# Patient Record
Sex: Male | Born: 1935
Health system: Southern US, Community
[De-identification: ages and names within clinical notes are randomized; demographics above are authoritative.]

## PROBLEM LIST (undated history)

## (undated) DIAGNOSIS — S7290XA Unspecified fracture of unspecified femur, initial encounter for closed fracture: Secondary | ICD-10-CM

## (undated) DIAGNOSIS — R112 Nausea with vomiting, unspecified: Secondary | ICD-10-CM

## (undated) DIAGNOSIS — N400 Enlarged prostate without lower urinary tract symptoms: Secondary | ICD-10-CM

## (undated) DIAGNOSIS — K219 Gastro-esophageal reflux disease without esophagitis: Secondary | ICD-10-CM

## (undated) DIAGNOSIS — G9001 Carotid sinus syncope: Secondary | ICD-10-CM

## (undated) DIAGNOSIS — C320 Malignant neoplasm of glottis: Secondary | ICD-10-CM

## (undated) DIAGNOSIS — M810 Age-related osteoporosis without current pathological fracture: Secondary | ICD-10-CM

## (undated) DIAGNOSIS — I6523 Occlusion and stenosis of bilateral carotid arteries: Secondary | ICD-10-CM

## (undated) DIAGNOSIS — Z9889 Other specified postprocedural states: Secondary | ICD-10-CM

## (undated) DIAGNOSIS — F0390 Unspecified dementia without behavioral disturbance: Secondary | ICD-10-CM

## (undated) DIAGNOSIS — E039 Hypothyroidism, unspecified: Secondary | ICD-10-CM

## (undated) DIAGNOSIS — I10 Essential (primary) hypertension: Secondary | ICD-10-CM

## (undated) HISTORY — DX: Gastro-esophageal reflux disease without esophagitis: K21.9

## (undated) HISTORY — PX: TRANSURETHRAL RESECTION OF PROSTATE: SHX73

## (undated) HISTORY — PX: PACEMAKER INSERTION: SHX728

## (undated) HISTORY — DX: Essential (primary) hypertension: I10

## (undated) HISTORY — PX: HERNIA REPAIR: SHX51

## (undated) HISTORY — DX: Hypothyroidism, unspecified: E03.9

## (undated) HISTORY — PX: NISSEN FUNDOPLICATION: SHX2091

## (undated) HISTORY — PX: OTHER SURGICAL HISTORY: SHX169

## (undated) HISTORY — DX: Malignant neoplasm of glottis: C32.0

---

## 1993-10-03 DIAGNOSIS — C320 Malignant neoplasm of glottis: Secondary | ICD-10-CM

## 1993-10-03 HISTORY — DX: Malignant neoplasm of glottis: C32.0

## 1993-10-03 HISTORY — PX: OTHER SURGICAL HISTORY: SHX169

## 2004-11-25 ENCOUNTER — Ambulatory Visit: Payer: Self-pay | Admitting: Cardiology

## 2004-12-01 ENCOUNTER — Ambulatory Visit: Payer: Self-pay | Admitting: Cardiology

## 2011-11-21 DIAGNOSIS — R55 Syncope and collapse: Secondary | ICD-10-CM | POA: Diagnosis not present

## 2011-12-12 DIAGNOSIS — C329 Malignant neoplasm of larynx, unspecified: Secondary | ICD-10-CM | POA: Diagnosis not present

## 2011-12-12 DIAGNOSIS — R1310 Dysphagia, unspecified: Secondary | ICD-10-CM | POA: Diagnosis not present

## 2012-02-08 DIAGNOSIS — E7800 Pure hypercholesterolemia, unspecified: Secondary | ICD-10-CM | POA: Diagnosis not present

## 2012-02-08 DIAGNOSIS — E039 Hypothyroidism, unspecified: Secondary | ICD-10-CM | POA: Diagnosis not present

## 2012-02-08 DIAGNOSIS — B079 Viral wart, unspecified: Secondary | ICD-10-CM | POA: Diagnosis not present

## 2012-03-28 DIAGNOSIS — Z45018 Encounter for adjustment and management of other part of cardiac pacemaker: Secondary | ICD-10-CM | POA: Diagnosis not present

## 2012-03-28 DIAGNOSIS — I495 Sick sinus syndrome: Secondary | ICD-10-CM | POA: Diagnosis not present

## 2012-04-12 DIAGNOSIS — H35369 Drusen (degenerative) of macula, unspecified eye: Secondary | ICD-10-CM | POA: Diagnosis not present

## 2012-06-27 DIAGNOSIS — I495 Sick sinus syndrome: Secondary | ICD-10-CM | POA: Diagnosis not present

## 2012-06-27 DIAGNOSIS — Z45018 Encounter for adjustment and management of other part of cardiac pacemaker: Secondary | ICD-10-CM | POA: Diagnosis not present

## 2012-07-03 DIAGNOSIS — I498 Other specified cardiac arrhythmias: Secondary | ICD-10-CM | POA: Diagnosis not present

## 2012-07-03 DIAGNOSIS — Z45018 Encounter for adjustment and management of other part of cardiac pacemaker: Secondary | ICD-10-CM | POA: Diagnosis not present

## 2012-07-10 DIAGNOSIS — Z23 Encounter for immunization: Secondary | ICD-10-CM | POA: Diagnosis not present

## 2012-08-01 DIAGNOSIS — Z85828 Personal history of other malignant neoplasm of skin: Secondary | ICD-10-CM | POA: Diagnosis not present

## 2012-08-01 DIAGNOSIS — L570 Actinic keratosis: Secondary | ICD-10-CM | POA: Diagnosis not present

## 2012-08-14 DIAGNOSIS — Z0000 Encounter for general adult medical examination without abnormal findings: Secondary | ICD-10-CM | POA: Diagnosis not present

## 2012-08-14 DIAGNOSIS — R5381 Other malaise: Secondary | ICD-10-CM | POA: Diagnosis not present

## 2012-08-14 DIAGNOSIS — E7800 Pure hypercholesterolemia, unspecified: Secondary | ICD-10-CM | POA: Diagnosis not present

## 2012-08-14 DIAGNOSIS — Z1212 Encounter for screening for malignant neoplasm of rectum: Secondary | ICD-10-CM | POA: Diagnosis not present

## 2012-08-14 DIAGNOSIS — E559 Vitamin D deficiency, unspecified: Secondary | ICD-10-CM | POA: Diagnosis not present

## 2012-08-14 DIAGNOSIS — E039 Hypothyroidism, unspecified: Secondary | ICD-10-CM | POA: Diagnosis not present

## 2012-09-07 DIAGNOSIS — R319 Hematuria, unspecified: Secondary | ICD-10-CM | POA: Diagnosis not present

## 2012-09-07 DIAGNOSIS — Z87448 Personal history of other diseases of urinary system: Secondary | ICD-10-CM | POA: Diagnosis not present

## 2012-09-12 DIAGNOSIS — Z79899 Other long term (current) drug therapy: Secondary | ICD-10-CM | POA: Diagnosis not present

## 2012-10-04 DIAGNOSIS — K921 Melena: Secondary | ICD-10-CM | POA: Diagnosis not present

## 2012-10-05 ENCOUNTER — Telehealth: Payer: Self-pay | Admitting: Internal Medicine

## 2012-10-05 NOTE — Telephone Encounter (Signed)
Pt came into office this morning to let us know that his PCP (Dr Sherryll Burger) would be calling us to set him up for a TCS. He said that their computers were down and couldn't fax [place] anything at the moment. I told him that we may need to bring him in for an OV or we might be able to just triage him over the phone, but we need to hear from his PCP first. He said he wasn't having any problems now, but did have a little bit of blood in his stool back in November and was fine now. Please advise if patient will need OV or can he be triaged. [phone]

## 2012-10-08 NOTE — Telephone Encounter (Signed)
Pt is scheduled for OV with Gerrit Halls, NP on 10/17/2012 at 2:00 PM.

## 2012-10-08 NOTE — Telephone Encounter (Signed)
Received the referral this AM. Pt was referred for Heme positive stool.

## 2012-10-15 DIAGNOSIS — I495 Sick sinus syndrome: Secondary | ICD-10-CM | POA: Diagnosis not present

## 2012-10-16 DIAGNOSIS — H353190 Nonexudative age-related macular degeneration, unspecified eye, stage unspecified: Secondary | ICD-10-CM | POA: Diagnosis not present

## 2012-10-16 DIAGNOSIS — H11049 Peripheral pterygium, stationary, unspecified eye: Secondary | ICD-10-CM | POA: Diagnosis not present

## 2012-10-16 DIAGNOSIS — H2510 Age-related nuclear cataract, unspecified eye: Secondary | ICD-10-CM | POA: Diagnosis not present

## 2012-10-16 DIAGNOSIS — H43819 Vitreous degeneration, unspecified eye: Secondary | ICD-10-CM | POA: Diagnosis not present

## 2012-10-17 ENCOUNTER — Encounter: Payer: Self-pay | Admitting: Gastroenterology

## 2012-10-17 ENCOUNTER — Other Ambulatory Visit: Payer: Self-pay | Admitting: Internal Medicine

## 2012-10-17 ENCOUNTER — Ambulatory Visit (INDEPENDENT_AMBULATORY_CARE_PROVIDER_SITE_OTHER): Payer: Medicare Other | Admitting: Gastroenterology

## 2012-10-17 VITALS — BP 145/67 | HR 88 | Temp 98.1°F | Ht 71.0 in | Wt 155.4 lb

## 2012-10-17 DIAGNOSIS — R195 Other fecal abnormalities: Secondary | ICD-10-CM | POA: Diagnosis not present

## 2012-10-17 MED ORDER — PEG 3350-KCL-NA BICARB-NACL 420 G PO SOLR: 4000.0000 mL | ORAL | Status: DC

## 2012-10-17 NOTE — Patient Instructions (Addendum)
We have scheduled you for a colonoscopy with Dr. Jena Gauss in the near future.  Further recommendations to follow once this is completed.

## 2012-10-17 NOTE — Assessment & Plan Note (Addendum)
79 year old pleasant male with heme positive stool, scant paper hematochezia in the setting of mild constipation. Last TCS was at [facility] at least 10 years ago, reportedly normal per pt. CBC has been requested from PCP. Pt has no significant lower GI symptoms, no changes in bowel habits. He has lost a few pounds over the past 6 months, but he does note he has increased his walking regimen. No upper GI symptoms.   Obtain CBC for our records. (Received as of 1/17. No anemia noted).  Proceed with TCS with Dr. Jena Gauss in near future: the risks, benefits, and alternatives have been discussed with the patient in detail. The patient states understanding and desires to proceed. Due to pt's reported hx of significant nausea with anesthesia, we will proceed with Zofran 4 mg IV on call Pt has pacemaker.

## 2012-10-17 NOTE — Progress Notes (Signed)
Primary Care Physician:  Kirstie Peri, MD Primary Gastroenterologist:  Dr. Jena Gauss   Chief Complaint  Patient presents with  . Rectal Bleeding    HPI:   79 year old male who presents today secondary to heme positive stool. Last TCS at least 10 years ago at [facility] per pt. Pt believes it was normal. Noted paper hematochezia in November in the setting of mild constipation. Denies any rectal pain, discomfort. Occasional gas but not enough to even mention, per pt. No changes in bowel habits. Notes losing about 5-6 pounds over the past 6 months, but he has increased his walking. No nausea or vomiting. On Prilosec, which controls GERD. No dysphagia.   Past Medical History  Diagnosis Date  . Hypothyroidism   . GERD (gastroesophageal reflux disease)   . Vocal cord cancer     at least 13 years ago  . Hypertension     Past Surgical History  Procedure Date  . Nissen fundoplication   . Vocal cord cancer removal   . Hernia repair   . Pacemaker insertion   . Transurethral resection of prostate     Current Outpatient Prescriptions  Medication Sig Dispense Refill  . alendronate (FOSAMAX) 70 MG tablet Take 70 mg by mouth every 7 (seven) days.       Marland Kitchen aspirin 325 MG tablet Take 325 mg by mouth daily.      Marland Kitchen omeprazole (PRILOSEC) 20 MG capsule Take 20 mg by mouth daily.       Marland Kitchen SYNTHROID 75 MCG tablet Take 75 mcg by mouth daily.       Marland Kitchen triamterene-hydrochlorothiazide (MAXZIDE-25) 37.5-25 MG per tablet Take 1 tablet by mouth daily.         Allergies as of 10/17/2012 - Review Complete 10/17/2012  Allergen Reaction Noted  . Prevacid (lansoprazole) Other (See Comments) 10/17/2012  . Prilosec (omeprazole) Other (See Comments) 10/17/2012  . Codeine Other (See Comments) 10/17/2012  . Penicillins Rash 10/17/2012    Family History  Problem Relation Age of Onset  . Colon cancer Neg Hx     History   Social History  . Marital Status: Married    Spouse Name: N/A    Number of Children: N/A    . Years of Education: N/A   Occupational History  . Retired     Financial controller in [place]   Social History Main Topics  . Smoking status: Never Smoker   . Smokeless tobacco: Not on file  . Alcohol Use: No  . Drug Use: No  . Sexually Active: Not on file   Other Topics Concern  . Not on file   Social History Narrative  . No narrative on file    Review of Systems: Gen: SEE HPI  CV: Denies chest pain, heart palpitations, peripheral edema, syncope.  Resp: Denies shortness of breath at rest or with exertion. Denies wheezing or cough.  GI: SEE HPI GU : Denies urinary burning, urinary frequency, urinary hesitancy MS: +joint pain Derm: Denies rash, itching, dry skin Psych: Denies depression, anxiety, memory loss, and confusion Heme: Denies bruising, bleeding, and enlarged lymph nodes.  Physical Exam: BP 145/67  Pulse 88  Temp 98.1 F (36.7 C) (Oral)  Ht 5\' 11"  (1.803 m)  Wt 155 lb 6.4 oz (70.489 kg)  BMI 21.67 kg/m2 General:   Alert and oriented. Pleasant and cooperative. Well-nourished and well-developed.  Head:  Normocephalic and atraumatic. Eyes:  Without icterus, sclera clear and conjunctiva pink.  Ears:  Normal auditory acuity. Nose:  No  deformity, discharge,  or lesions. Mouth:  No deformity or lesions, oral mucosa pink.  Neck:  Supple, without mass or thyromegaly. Lungs:  Clear to auscultation bilaterally. No wheezes, rales, or rhonchi. No distress.  Heart:  S1, S2 present without murmurs appreciated.  Abdomen:  +BS, soft, non-tender and non-distended. No HSM noted. No guarding or rebound. Questionable small ventral hernia vs rectus diastasis.  Rectal:  Deferred  Msk:  Symmetrical without gross deformities. Normal posture. Extremities:  Without clubbing or edema. Neurologic:  Alert and  oriented x4;  grossly normal neurologically. Skin:  Intact without significant lesions or rashes. Cervical Nodes:  No significant cervical adenopathy. Psych:  Alert and  cooperative. Normal mood and affect.

## 2012-10-17 NOTE — Progress Notes (Signed)
Faxed to PCP

## 2012-10-18 ENCOUNTER — Encounter (HOSPITAL_COMMUNITY): Payer: Self-pay | Admitting: Pharmacy Technician

## 2012-10-22 NOTE — Progress Notes (Signed)
Labs from Nov 2013:  Hgb 15.7, Hct 46.5, PLTs 241

## 2012-10-24 ENCOUNTER — Encounter: Payer: Self-pay | Admitting: Gastroenterology

## 2012-10-24 DIAGNOSIS — R110 Nausea: Secondary | ICD-10-CM

## 2012-10-24 MED ORDER — PROMETHAZINE HCL 25 MG/ML IJ SOLN: 12.5000 mg | Freq: Once | INTRAMUSCULAR | Status: DC

## 2012-10-24 MED ORDER — ONDANSETRON HCL 4 MG/2ML IJ SOLN: 4.0000 mg | Freq: Once | INTRAMUSCULAR | Status: DC

## 2012-10-24 NOTE — Addendum Note (Signed)
Addended by: Jennings Books on: 10/24/2012 12:12 PM   Modules accepted: Orders

## 2012-10-24 NOTE — Addendum Note (Signed)
Addended by: Jennings Books on: 10/24/2012 12:09 PM   Modules accepted: Orders

## 2012-10-24 NOTE — Progress Notes (Addendum)
I had ordered Phenergan due to hx of significant nausea following anesthesia per patient.  After discussion with Dr. Jena Gauss, we will change this to Zofran 4 mg on call.

## 2012-10-24 NOTE — Progress Notes (Signed)
Patient ID: Phillip Taylor, male   DOB: 10-Aug-1925, 79 y.o.   MRN: [id number]    I spoke with Hollie Salk and she will cancel the phenergan order on patient.

## 2012-10-24 NOTE — Progress Notes (Signed)
Patient ID: Phillip Taylor, male   DOB: 1924/10/28, 79 y.o.   MRN: [id number]  I spoke with Shawna Orleans in Endo and Zofran ordered.

## 2012-10-24 NOTE — Progress Notes (Signed)
I ordered Phenergan 12.5 mg IV on call on the encounter sheet. HOWEVER, pt does NOT NEED any Phenergan prior to procedure. Please cancel the order for Phenergan 12.5 mg.

## 2012-10-25 ENCOUNTER — Ambulatory Visit (HOSPITAL_COMMUNITY)
Admission: RE | Admit: 2012-10-25 | Discharge: 2012-10-25 | Disposition: A | Discharge status: Home / Self Care | Payer: Medicare Other | Source: Ambulatory Visit | Attending: Internal Medicine | Admitting: Internal Medicine

## 2012-10-25 ENCOUNTER — Encounter (HOSPITAL_COMMUNITY)
Admission: RE | Disposition: A | Discharge status: Home / Self Care | Payer: Self-pay | Source: Ambulatory Visit | Attending: Internal Medicine

## 2012-10-25 ENCOUNTER — Encounter (HOSPITAL_COMMUNITY): Payer: Self-pay

## 2012-10-25 DIAGNOSIS — I10 Essential (primary) hypertension: Secondary | ICD-10-CM | POA: Insufficient documentation

## 2012-10-25 DIAGNOSIS — K620 Anal polyp: Secondary | ICD-10-CM

## 2012-10-25 DIAGNOSIS — R195 Other fecal abnormalities: Secondary | ICD-10-CM

## 2012-10-25 DIAGNOSIS — R110 Nausea: Secondary | ICD-10-CM

## 2012-10-25 DIAGNOSIS — D128 Benign neoplasm of rectum: Secondary | ICD-10-CM | POA: Diagnosis not present

## 2012-10-25 DIAGNOSIS — K621 Rectal polyp: Secondary | ICD-10-CM

## 2012-10-25 DIAGNOSIS — K5730 Diverticulosis of large intestine without perforation or abscess without bleeding: Secondary | ICD-10-CM | POA: Insufficient documentation

## 2012-10-25 DIAGNOSIS — K921 Melena: Secondary | ICD-10-CM | POA: Diagnosis not present

## 2012-10-25 DIAGNOSIS — D126 Benign neoplasm of colon, unspecified: Secondary | ICD-10-CM

## 2012-10-25 HISTORY — DX: Nausea with vomiting, unspecified: R11.2

## 2012-10-25 HISTORY — DX: Nausea with vomiting, unspecified: Z98.890

## 2012-10-25 HISTORY — PX: COLONOSCOPY: SHX5424

## 2012-10-25 SURGERY — COLONOSCOPY
Anesthesia: Moderate Sedation

## 2012-10-25 MED ORDER — ONDANSETRON HCL 4 MG/2ML IJ SOLN
INTRAMUSCULAR | Status: AC
  Filled 2012-10-25: qty 2

## 2012-10-25 MED ORDER — MIDAZOLAM HCL 5 MG/5ML IJ SOLN
INTRAMUSCULAR | Status: DC | PRN
  Administered 2012-10-25: 2 mg via INTRAVENOUS
  Administered 2012-10-25: 1 mg via INTRAVENOUS

## 2012-10-25 MED ORDER — MIDAZOLAM HCL 5 MG/5ML IJ SOLN
INTRAMUSCULAR | Status: AC
  Filled 2012-10-25: qty 10

## 2012-10-25 MED ORDER — MEPERIDINE HCL 100 MG/ML IJ SOLN
INTRAMUSCULAR | Status: AC
  Filled 2012-10-25: qty 2

## 2012-10-25 MED ORDER — STERILE WATER FOR IRRIGATION IR SOLN
Status: DC | PRN
  Administered 2012-10-25: 15:00:00

## 2012-10-25 MED ORDER — MEPERIDINE HCL 100 MG/ML IJ SOLN
INTRAMUSCULAR | Status: DC | PRN
  Administered 2012-10-25: 50 mg via INTRAVENOUS

## 2012-10-25 MED ORDER — SODIUM CHLORIDE 0.45 % IV SOLN
INTRAVENOUS | Status: DC
  Administered 2012-10-25: 13:00:00 via INTRAVENOUS

## 2012-10-25 MED ORDER — ONDANSETRON HCL 4 MG/2ML IJ SOLN
INTRAMUSCULAR | Status: DC | PRN
  Administered 2012-10-25: 4 mg via INTRAVENOUS

## 2012-10-25 MED ORDER — ONDANSETRON HCL 4 MG/2ML IJ SOLN
4.0000 mg | Freq: Once | INTRAMUSCULAR | Status: AC
  Administered 2012-10-25: 4 mg via INTRAVENOUS

## 2012-10-25 NOTE — Op Note (Signed)
[facility] [address] [place], [address]   COLONOSCOPY PROCEDURE REPORT  PATIENT: Chanel, Mckesson  MR#:         [id number] BIRTHDATE: [birth year] , 87  yrs. old GENDER: Male ENDOSCOPIST: R.  Roetta Sessions, MD FACP FACG REFERRED BY:  Kirstie Peri, M.D. PROCEDURE DATE:  10/25/2012 PROCEDURE:     ileocolonoscopy with multiple snare polypectomies  INDICATIONS: Hemoccult-positive stool; last colonoscopy 10 years ago  INFORMED CONSENT:  The risks, benefits, alternatives and imponderables including but not limited to bleeding, perforation as well as the possibility of a missed lesion have been reviewed.  The potential for biopsy, lesion removal, etc. have also been discussed.  Questions have been answered.  All parties agreeable. Please see the history and physical in the medical record for more information.  MEDICATIONS: Versed 3 mg IV and Demerol 50 mg IV in divided doses.  DESCRIPTION OF PROCEDURE:  After a digital rectal exam was performed, the Pentax Colonoscope [phone]  colonoscope was advanced from the anus through the rectum and colon to the area of the cecum, ileocecal valve and appendiceal orifice.  The cecum was deeply intubated.  These structures were well-seen and photographed for the record.  From the level of the cecum and ileocecal valve, the scope was slowly and cautiously withdrawn.  The mucosal surfaces were carefully surveyed utilizing scope tip deflection to facilitate fold flattening as needed.  The scope was pulled down into the rectum where a thorough examination including retroflexion was performed.    FINDINGS:  Adequate preparation.  4 mm sessile polyp in the rectum at 3 cm from the anal verge; otherwise, normal rectum aside from internal hemorrhoids. Sigmoid diverticulosis. 1.25 cm pedunculated polyp at the splenic flexure. The patient had (1) 5 mm polyp just distal to the ileocecal valve; otherwise, the remainder of the colonic  mucosa appeared normal.  THERAPEUTIC / DIAGNOSTIC MANEUVERS PERFORMED: The above-mentioned polyps were hot snare /  removed. The rectal polyp was not recovered.  COMPLICATIONS: None  CECAL WITHDRAWAL TIME:  16 minutes  IMPRESSION:  Colonic diverticulosis. Colonic polyps-multiple-status post multiple snare polypectomies  RECOMMENDATIONS: Followup on pathology.   _______________________________ eSigned:  R. Roetta Sessions, MD FACP [facility] 10/25/2012 3:38 PM   CC:    PATIENT NAME:  Oluwatomisin, Deman MR#: [id number]

## 2012-10-25 NOTE — H&P (View-Only) (Signed)
 Primary Care Physician:  SHAH,ASHISH, MD Primary Gastroenterologist:  Dr. Rourk   Chief Complaint  Patient presents with  . Rectal Bleeding    HPI:   79-year-old male who presents today secondary to heme positive stool. Last TCS at least 10 years ago at [facility] per pt. Pt believes it was normal. Noted paper hematochezia in November in the setting of mild constipation. Denies any rectal pain, discomfort. Occasional gas but not enough to even mention, per pt. No changes in bowel habits. Notes losing about 5-6 pounds over the past 6 months, but he has increased his walking. No nausea or vomiting. On Prilosec, which controls GERD. No dysphagia.   Past Medical History  Diagnosis Date  . Hypothyroidism   . GERD (gastroesophageal reflux disease)   . Vocal cord cancer     at least 13 years ago  . Hypertension     Past Surgical History  Procedure Date  . Nissen fundoplication   . Vocal cord cancer removal   . Hernia repair   . Pacemaker insertion   . Transurethral resection of prostate     Current Outpatient Prescriptions  Medication Sig Dispense Refill  . alendronate (FOSAMAX) 70 MG tablet Take 70 mg by mouth every 7 (seven) days.       . aspirin 325 MG tablet Take 325 mg by mouth daily.      . omeprazole (PRILOSEC) 20 MG capsule Take 20 mg by mouth daily.       . SYNTHROID 75 MCG tablet Take 75 mcg by mouth daily.       . triamterene-hydrochlorothiazide (MAXZIDE-25) 37.5-25 MG per tablet Take 1 tablet by mouth daily.         Allergies as of 10/17/2012 - Review Complete 10/17/2012  Allergen Reaction Noted  . Prevacid (lansoprazole) Other (See Comments) 10/17/2012  . Prilosec (omeprazole) Other (See Comments) 10/17/2012  . Codeine Other (See Comments) 10/17/2012  . Penicillins Rash 10/17/2012    Family History  Problem Relation Age of Onset  . Colon cancer Neg Hx     History   Social History  . Marital Status: Married    Spouse Name: N/A    Number of Children: N/A    . Years of Education: N/A   Occupational History  . Retired     [employer] in [place]   Social History Main Topics  . Smoking status: Never Smoker   . Smokeless tobacco: Not on file  . Alcohol Use: No  . Drug Use: No  . Sexually Active: Not on file   Other Topics Concern  . Not on file   Social History Narrative  . No narrative on file    Review of Systems: Gen: SEE HPI  CV: Denies chest pain, heart palpitations, peripheral edema, syncope.  Resp: Denies shortness of breath at rest or with exertion. Denies wheezing or cough.  GI: SEE HPI GU : Denies urinary burning, urinary frequency, urinary hesitancy MS: +joint pain Derm: Denies rash, itching, dry skin Psych: Denies depression, anxiety, memory loss, and confusion Heme: Denies bruising, bleeding, and enlarged lymph nodes.  Physical Exam: BP 145/67  Pulse 88  Temp 98.1 F (36.7 C) (Oral)  Ht 5' 11" (1.803 m)  Wt 155 lb 6.4 oz (70.489 kg)  BMI 21.67 kg/m2 General:   Alert and oriented. Pleasant and cooperative. Well-nourished and well-developed.  Head:  Normocephalic and atraumatic. Eyes:  Without icterus, sclera clear and conjunctiva pink.  Ears:  Normal auditory acuity. Nose:  No   deformity, discharge,  or lesions. Mouth:  No deformity or lesions, oral mucosa pink.  Neck:  Supple, without mass or thyromegaly. Lungs:  Clear to auscultation bilaterally. No wheezes, rales, or rhonchi. No distress.  Heart:  S1, S2 present without murmurs appreciated.  Abdomen:  +BS, soft, non-tender and non-distended. No HSM noted. No guarding or rebound. Questionable small ventral hernia vs rectus diastasis.  Rectal:  Deferred  Msk:  Symmetrical without gross deformities. Normal posture. Extremities:  Without clubbing or edema. Neurologic:  Alert and  oriented x4;  grossly normal neurologically. Skin:  Intact without significant lesions or rashes. Cervical Nodes:  No significant cervical adenopathy. Psych:  Alert and  cooperative. Normal mood and affect.     

## 2012-10-25 NOTE — Interval H&P Note (Signed)
History and Physical Interval Note:  10/25/2012 2:43 PM  Phillip Taylor  has presented today for surgery, with the diagnosis of HEME POSITIVE STOOL  The various methods of treatment have been discussed with the patient and family. After consideration of risks, benefits and other options for treatment, the patient has consented to  Procedure(s) (LRB) with comments: COLONOSCOPY (N/A) - 1:30 as a surgical intervention .  The patient's history has been reviewed, patient examined, no change in status, stable for surgery.  I have reviewed the patient's chart and labs.  Questions were answered to the patient's satisfaction.      The risks, benefits, limitations, alternatives and imponderables have been reviewed with the patient. Questions have been answered. All parties are agreeable.

## 2012-10-29 ENCOUNTER — Encounter (HOSPITAL_COMMUNITY): Payer: Self-pay | Admitting: Internal Medicine

## 2012-10-30 ENCOUNTER — Encounter: Payer: Self-pay | Admitting: Internal Medicine

## 2012-10-31 ENCOUNTER — Encounter: Payer: Self-pay | Admitting: *Deleted

## 2012-11-13 DIAGNOSIS — Z950 Presence of cardiac pacemaker: Secondary | ICD-10-CM | POA: Diagnosis not present

## 2012-11-13 DIAGNOSIS — L570 Actinic keratosis: Secondary | ICD-10-CM | POA: Diagnosis not present

## 2012-11-13 DIAGNOSIS — Z48812 Encounter for surgical aftercare following surgery on the circulatory system: Secondary | ICD-10-CM | POA: Diagnosis not present

## 2012-11-13 DIAGNOSIS — R55 Syncope and collapse: Secondary | ICD-10-CM | POA: Diagnosis not present

## 2012-11-13 DIAGNOSIS — G9001 Carotid sinus syncope: Secondary | ICD-10-CM | POA: Diagnosis not present

## 2012-11-13 DIAGNOSIS — E039 Hypothyroidism, unspecified: Secondary | ICD-10-CM | POA: Diagnosis not present

## 2012-11-13 DIAGNOSIS — I10 Essential (primary) hypertension: Secondary | ICD-10-CM | POA: Diagnosis not present

## 2012-12-04 DIAGNOSIS — M159 Polyosteoarthritis, unspecified: Secondary | ICD-10-CM | POA: Diagnosis not present

## 2012-12-04 DIAGNOSIS — I10 Essential (primary) hypertension: Secondary | ICD-10-CM | POA: Diagnosis not present

## 2012-12-04 DIAGNOSIS — R946 Abnormal results of thyroid function studies: Secondary | ICD-10-CM | POA: Diagnosis not present

## 2012-12-17 DIAGNOSIS — C329 Malignant neoplasm of larynx, unspecified: Secondary | ICD-10-CM | POA: Diagnosis not present

## 2012-12-17 DIAGNOSIS — J383 Other diseases of vocal cords: Secondary | ICD-10-CM | POA: Diagnosis not present

## 2012-12-17 DIAGNOSIS — Z8521 Personal history of malignant neoplasm of larynx: Secondary | ICD-10-CM | POA: Diagnosis not present

## 2013-01-09 DIAGNOSIS — I495 Sick sinus syndrome: Secondary | ICD-10-CM | POA: Diagnosis not present

## 2013-01-09 DIAGNOSIS — Z45018 Encounter for adjustment and management of other part of cardiac pacemaker: Secondary | ICD-10-CM | POA: Diagnosis not present

## 2013-01-09 DIAGNOSIS — Z950 Presence of cardiac pacemaker: Secondary | ICD-10-CM | POA: Diagnosis not present

## 2013-02-12 DIAGNOSIS — M1990 Unspecified osteoarthritis, unspecified site: Secondary | ICD-10-CM | POA: Diagnosis not present

## 2013-04-17 DIAGNOSIS — I495 Sick sinus syndrome: Secondary | ICD-10-CM | POA: Diagnosis not present

## 2013-04-17 DIAGNOSIS — Z950 Presence of cardiac pacemaker: Secondary | ICD-10-CM | POA: Diagnosis not present

## 2013-05-28 DIAGNOSIS — Z23 Encounter for immunization: Secondary | ICD-10-CM | POA: Diagnosis not present

## 2013-05-29 DIAGNOSIS — H35369 Drusen (degenerative) of macula, unspecified eye: Secondary | ICD-10-CM | POA: Diagnosis not present

## 2013-06-12 DIAGNOSIS — E039 Hypothyroidism, unspecified: Secondary | ICD-10-CM | POA: Diagnosis not present

## 2013-06-12 DIAGNOSIS — I10 Essential (primary) hypertension: Secondary | ICD-10-CM | POA: Diagnosis not present

## 2013-06-28 ENCOUNTER — Encounter (HOSPITAL_COMMUNITY): Payer: Self-pay

## 2013-06-28 ENCOUNTER — Emergency Department (HOSPITAL_COMMUNITY)
Admission: EM | Admit: 2013-06-28 | Discharge: 2013-06-28 | Discharge status: Against Medical Advice | Payer: Medicare Other

## 2013-07-04 DIAGNOSIS — Z950 Presence of cardiac pacemaker: Secondary | ICD-10-CM | POA: Diagnosis not present

## 2013-07-04 DIAGNOSIS — Z45018 Encounter for adjustment and management of other part of cardiac pacemaker: Secondary | ICD-10-CM | POA: Diagnosis not present

## 2013-08-07 DIAGNOSIS — L570 Actinic keratosis: Secondary | ICD-10-CM | POA: Diagnosis not present

## 2013-08-07 DIAGNOSIS — Z85828 Personal history of other malignant neoplasm of skin: Secondary | ICD-10-CM | POA: Diagnosis not present

## 2013-08-14 DIAGNOSIS — Z0000 Encounter for general adult medical examination without abnormal findings: Secondary | ICD-10-CM | POA: Diagnosis not present

## 2013-08-14 DIAGNOSIS — Z79899 Other long term (current) drug therapy: Secondary | ICD-10-CM | POA: Diagnosis not present

## 2013-08-14 DIAGNOSIS — E785 Hyperlipidemia, unspecified: Secondary | ICD-10-CM | POA: Diagnosis not present

## 2013-08-14 DIAGNOSIS — E039 Hypothyroidism, unspecified: Secondary | ICD-10-CM | POA: Diagnosis not present

## 2013-08-14 DIAGNOSIS — Z125 Encounter for screening for malignant neoplasm of prostate: Secondary | ICD-10-CM | POA: Diagnosis not present

## 2013-08-14 DIAGNOSIS — E559 Vitamin D deficiency, unspecified: Secondary | ICD-10-CM | POA: Diagnosis not present

## 2013-09-16 DIAGNOSIS — N400 Enlarged prostate without lower urinary tract symptoms: Secondary | ICD-10-CM | POA: Diagnosis not present

## 2013-11-12 DIAGNOSIS — Z950 Presence of cardiac pacemaker: Secondary | ICD-10-CM | POA: Diagnosis not present

## 2013-11-12 DIAGNOSIS — G9001 Carotid sinus syncope: Secondary | ICD-10-CM | POA: Diagnosis not present

## 2013-11-20 DIAGNOSIS — I700 Atherosclerosis of aorta: Secondary | ICD-10-CM | POA: Diagnosis not present

## 2013-11-21 DIAGNOSIS — E7800 Pure hypercholesterolemia, unspecified: Secondary | ICD-10-CM | POA: Diagnosis not present

## 2013-11-21 DIAGNOSIS — I10 Essential (primary) hypertension: Secondary | ICD-10-CM | POA: Diagnosis not present

## 2013-12-17 DIAGNOSIS — C329 Malignant neoplasm of larynx, unspecified: Secondary | ICD-10-CM | POA: Diagnosis not present

## 2013-12-17 DIAGNOSIS — J383 Other diseases of vocal cords: Secondary | ICD-10-CM | POA: Diagnosis not present

## 2013-12-17 DIAGNOSIS — R490 Dysphonia: Secondary | ICD-10-CM | POA: Diagnosis not present

## 2013-12-18 DIAGNOSIS — I495 Sick sinus syndrome: Secondary | ICD-10-CM | POA: Diagnosis not present

## 2014-03-19 DIAGNOSIS — I495 Sick sinus syndrome: Secondary | ICD-10-CM | POA: Diagnosis not present

## 2014-06-18 DIAGNOSIS — I495 Sick sinus syndrome: Secondary | ICD-10-CM | POA: Diagnosis not present

## 2014-06-18 DIAGNOSIS — Z45018 Encounter for adjustment and management of other part of cardiac pacemaker: Secondary | ICD-10-CM | POA: Diagnosis not present

## 2014-06-25 DIAGNOSIS — I10 Essential (primary) hypertension: Secondary | ICD-10-CM | POA: Diagnosis not present

## 2014-06-25 DIAGNOSIS — I519 Heart disease, unspecified: Secondary | ICD-10-CM | POA: Diagnosis not present

## 2014-06-25 DIAGNOSIS — Z23 Encounter for immunization: Secondary | ICD-10-CM | POA: Diagnosis not present

## 2014-07-10 DIAGNOSIS — Z950 Presence of cardiac pacemaker: Secondary | ICD-10-CM | POA: Diagnosis not present

## 2014-07-10 DIAGNOSIS — I495 Sick sinus syndrome: Secondary | ICD-10-CM | POA: Diagnosis not present

## 2014-07-16 DIAGNOSIS — R7301 Impaired fasting glucose: Secondary | ICD-10-CM | POA: Diagnosis not present

## 2014-07-16 DIAGNOSIS — I10 Essential (primary) hypertension: Secondary | ICD-10-CM | POA: Diagnosis not present

## 2014-09-17 DIAGNOSIS — Z45010 Encounter for checking and testing of cardiac pacemaker pulse generator [battery]: Secondary | ICD-10-CM | POA: Diagnosis not present

## 2014-09-17 DIAGNOSIS — I495 Sick sinus syndrome: Secondary | ICD-10-CM | POA: Diagnosis not present

## 2014-10-10 DIAGNOSIS — N400 Enlarged prostate without lower urinary tract symptoms: Secondary | ICD-10-CM | POA: Diagnosis not present

## 2014-11-17 DIAGNOSIS — E039 Hypothyroidism, unspecified: Secondary | ICD-10-CM | POA: Diagnosis not present

## 2014-11-17 DIAGNOSIS — L570 Actinic keratosis: Secondary | ICD-10-CM | POA: Diagnosis not present

## 2014-11-17 DIAGNOSIS — G9001 Carotid sinus syncope: Secondary | ICD-10-CM | POA: Diagnosis not present

## 2014-11-17 DIAGNOSIS — B359 Dermatophytosis, unspecified: Secondary | ICD-10-CM | POA: Diagnosis not present

## 2014-11-17 DIAGNOSIS — N400 Enlarged prostate without lower urinary tract symptoms: Secondary | ICD-10-CM | POA: Diagnosis not present

## 2014-11-17 DIAGNOSIS — Z8501 Personal history of malignant neoplasm of esophagus: Secondary | ICD-10-CM | POA: Diagnosis not present

## 2014-11-17 DIAGNOSIS — I10 Essential (primary) hypertension: Secondary | ICD-10-CM | POA: Diagnosis not present

## 2014-11-17 DIAGNOSIS — N529 Male erectile dysfunction, unspecified: Secondary | ICD-10-CM | POA: Diagnosis not present

## 2014-11-17 DIAGNOSIS — R55 Syncope and collapse: Secondary | ICD-10-CM | POA: Diagnosis not present

## 2014-11-17 DIAGNOSIS — K219 Gastro-esophageal reflux disease without esophagitis: Secondary | ICD-10-CM | POA: Diagnosis not present

## 2014-11-17 DIAGNOSIS — Z950 Presence of cardiac pacemaker: Secondary | ICD-10-CM | POA: Diagnosis not present

## 2014-12-16 DIAGNOSIS — Z8521 Personal history of malignant neoplasm of larynx: Secondary | ICD-10-CM | POA: Diagnosis not present

## 2014-12-16 DIAGNOSIS — C329 Malignant neoplasm of larynx, unspecified: Secondary | ICD-10-CM | POA: Diagnosis not present

## 2014-12-16 DIAGNOSIS — J383 Other diseases of vocal cords: Secondary | ICD-10-CM | POA: Diagnosis not present

## 2015-01-07 DIAGNOSIS — Z45018 Encounter for adjustment and management of other part of cardiac pacemaker: Secondary | ICD-10-CM | POA: Diagnosis not present

## 2015-01-07 DIAGNOSIS — I495 Sick sinus syndrome: Secondary | ICD-10-CM | POA: Diagnosis not present

## 2015-02-17 DIAGNOSIS — E039 Hypothyroidism, unspecified: Secondary | ICD-10-CM | POA: Diagnosis not present

## 2015-02-17 DIAGNOSIS — R002 Palpitations: Secondary | ICD-10-CM | POA: Diagnosis not present

## 2015-02-18 DIAGNOSIS — I495 Sick sinus syndrome: Secondary | ICD-10-CM | POA: Diagnosis not present

## 2015-02-20 DIAGNOSIS — R002 Palpitations: Secondary | ICD-10-CM | POA: Diagnosis not present

## 2015-02-20 DIAGNOSIS — E039 Hypothyroidism, unspecified: Secondary | ICD-10-CM | POA: Diagnosis not present

## 2015-06-24 DIAGNOSIS — Z45018 Encounter for adjustment and management of other part of cardiac pacemaker: Secondary | ICD-10-CM | POA: Diagnosis not present

## 2015-06-24 DIAGNOSIS — I495 Sick sinus syndrome: Secondary | ICD-10-CM | POA: Diagnosis not present

## 2015-06-30 DIAGNOSIS — H52223 Regular astigmatism, bilateral: Secondary | ICD-10-CM | POA: Diagnosis not present

## 2015-06-30 DIAGNOSIS — H3530 Unspecified macular degeneration: Secondary | ICD-10-CM | POA: Diagnosis not present

## 2015-06-30 DIAGNOSIS — H5203 Hypermetropia, bilateral: Secondary | ICD-10-CM | POA: Diagnosis not present

## 2015-06-30 DIAGNOSIS — H524 Presbyopia: Secondary | ICD-10-CM | POA: Diagnosis not present

## 2015-07-16 DIAGNOSIS — Z950 Presence of cardiac pacemaker: Secondary | ICD-10-CM | POA: Diagnosis not present

## 2015-07-16 DIAGNOSIS — I495 Sick sinus syndrome: Secondary | ICD-10-CM | POA: Diagnosis not present

## 2015-08-17 DIAGNOSIS — I10 Essential (primary) hypertension: Secondary | ICD-10-CM | POA: Diagnosis not present

## 2015-08-17 DIAGNOSIS — E039 Hypothyroidism, unspecified: Secondary | ICD-10-CM | POA: Diagnosis not present

## 2015-08-17 DIAGNOSIS — Z23 Encounter for immunization: Secondary | ICD-10-CM | POA: Diagnosis not present

## 2015-08-20 DIAGNOSIS — K5900 Constipation, unspecified: Secondary | ICD-10-CM | POA: Diagnosis not present

## 2015-08-20 DIAGNOSIS — R7301 Impaired fasting glucose: Secondary | ICD-10-CM | POA: Diagnosis not present

## 2015-08-20 DIAGNOSIS — E039 Hypothyroidism, unspecified: Secondary | ICD-10-CM | POA: Diagnosis not present

## 2015-08-20 DIAGNOSIS — M816 Localized osteoporosis [Lequesne]: Secondary | ICD-10-CM | POA: Diagnosis not present

## 2015-08-26 ENCOUNTER — Other Ambulatory Visit (HOSPITAL_COMMUNITY): Payer: Self-pay | Admitting: Internal Medicine

## 2015-08-26 DIAGNOSIS — Z79899 Other long term (current) drug therapy: Secondary | ICD-10-CM

## 2015-08-26 DIAGNOSIS — M8580 Other specified disorders of bone density and structure, unspecified site: Secondary | ICD-10-CM

## 2015-09-02 ENCOUNTER — Ambulatory Visit (HOSPITAL_COMMUNITY)
Admission: RE | Admit: 2015-09-02 | Discharge: 2015-09-02 | Disposition: A | Discharge status: Home / Self Care | Payer: Medicare Other | Source: Ambulatory Visit | Attending: Internal Medicine | Admitting: Internal Medicine

## 2015-09-02 DIAGNOSIS — M8580 Other specified disorders of bone density and structure, unspecified site: Secondary | ICD-10-CM | POA: Insufficient documentation

## 2015-09-02 DIAGNOSIS — M810 Age-related osteoporosis without current pathological fracture: Secondary | ICD-10-CM | POA: Diagnosis not present

## 2015-09-23 ENCOUNTER — Encounter: Payer: Self-pay | Admitting: Internal Medicine

## 2015-11-19 DIAGNOSIS — G9001 Carotid sinus syncope: Secondary | ICD-10-CM | POA: Diagnosis not present

## 2015-11-19 DIAGNOSIS — Z950 Presence of cardiac pacemaker: Secondary | ICD-10-CM | POA: Diagnosis not present

## 2015-12-15 DIAGNOSIS — Z8521 Personal history of malignant neoplasm of larynx: Secondary | ICD-10-CM | POA: Diagnosis not present

## 2015-12-15 DIAGNOSIS — C329 Malignant neoplasm of larynx, unspecified: Secondary | ICD-10-CM | POA: Diagnosis not present

## 2015-12-15 DIAGNOSIS — R490 Dysphonia: Secondary | ICD-10-CM | POA: Diagnosis not present

## 2015-12-15 DIAGNOSIS — Z923 Personal history of irradiation: Secondary | ICD-10-CM | POA: Diagnosis not present

## 2015-12-15 DIAGNOSIS — J383 Other diseases of vocal cords: Secondary | ICD-10-CM | POA: Diagnosis not present

## 2016-01-21 DIAGNOSIS — Z950 Presence of cardiac pacemaker: Secondary | ICD-10-CM | POA: Diagnosis not present

## 2016-01-21 DIAGNOSIS — I495 Sick sinus syndrome: Secondary | ICD-10-CM | POA: Diagnosis not present

## 2016-02-10 DIAGNOSIS — I495 Sick sinus syndrome: Secondary | ICD-10-CM | POA: Diagnosis not present

## 2016-02-24 DIAGNOSIS — E039 Hypothyroidism, unspecified: Secondary | ICD-10-CM | POA: Diagnosis not present

## 2016-02-24 DIAGNOSIS — R7301 Impaired fasting glucose: Secondary | ICD-10-CM | POA: Diagnosis not present

## 2016-02-26 DIAGNOSIS — R7301 Impaired fasting glucose: Secondary | ICD-10-CM | POA: Diagnosis not present

## 2016-02-26 DIAGNOSIS — K5900 Constipation, unspecified: Secondary | ICD-10-CM | POA: Diagnosis not present

## 2016-02-26 DIAGNOSIS — E039 Hypothyroidism, unspecified: Secondary | ICD-10-CM | POA: Diagnosis not present

## 2016-02-26 DIAGNOSIS — M810 Age-related osteoporosis without current pathological fracture: Secondary | ICD-10-CM | POA: Diagnosis not present

## 2016-04-28 DIAGNOSIS — Z45010 Encounter for checking and testing of cardiac pacemaker pulse generator [battery]: Secondary | ICD-10-CM | POA: Diagnosis not present

## 2016-04-28 DIAGNOSIS — R009 Unspecified abnormalities of heart beat: Secondary | ICD-10-CM | POA: Diagnosis not present

## 2016-05-05 DIAGNOSIS — Z8521 Personal history of malignant neoplasm of larynx: Secondary | ICD-10-CM | POA: Diagnosis not present

## 2016-05-05 DIAGNOSIS — Z45010 Encounter for checking and testing of cardiac pacemaker pulse generator [battery]: Secondary | ICD-10-CM | POA: Diagnosis not present

## 2016-05-16 DIAGNOSIS — R55 Syncope and collapse: Secondary | ICD-10-CM | POA: Diagnosis not present

## 2016-06-28 DIAGNOSIS — Z23 Encounter for immunization: Secondary | ICD-10-CM | POA: Diagnosis not present

## 2016-06-30 DIAGNOSIS — H2513 Age-related nuclear cataract, bilateral: Secondary | ICD-10-CM | POA: Diagnosis not present

## 2016-06-30 DIAGNOSIS — H35363 Drusen (degenerative) of macula, bilateral: Secondary | ICD-10-CM | POA: Diagnosis not present

## 2016-06-30 DIAGNOSIS — H11002 Unspecified pterygium of left eye: Secondary | ICD-10-CM | POA: Diagnosis not present

## 2016-06-30 DIAGNOSIS — H11151 Pinguecula, right eye: Secondary | ICD-10-CM | POA: Diagnosis not present

## 2016-09-01 DIAGNOSIS — E039 Hypothyroidism, unspecified: Secondary | ICD-10-CM | POA: Diagnosis not present

## 2016-09-01 DIAGNOSIS — R7301 Impaired fasting glucose: Secondary | ICD-10-CM | POA: Diagnosis not present

## 2016-11-07 DIAGNOSIS — Z8521 Personal history of malignant neoplasm of larynx: Secondary | ICD-10-CM | POA: Diagnosis not present

## 2016-11-07 DIAGNOSIS — K219 Gastro-esophageal reflux disease without esophagitis: Secondary | ICD-10-CM | POA: Diagnosis not present

## 2016-11-07 DIAGNOSIS — J383 Other diseases of vocal cords: Secondary | ICD-10-CM | POA: Diagnosis not present

## 2016-11-07 DIAGNOSIS — Z950 Presence of cardiac pacemaker: Secondary | ICD-10-CM | POA: Diagnosis not present

## 2016-11-07 DIAGNOSIS — I10 Essential (primary) hypertension: Secondary | ICD-10-CM | POA: Diagnosis not present

## 2016-11-16 DIAGNOSIS — I6523 Occlusion and stenosis of bilateral carotid arteries: Secondary | ICD-10-CM | POA: Diagnosis not present

## 2016-12-13 DIAGNOSIS — Z08 Encounter for follow-up examination after completed treatment for malignant neoplasm: Secondary | ICD-10-CM | POA: Diagnosis not present

## 2016-12-13 DIAGNOSIS — Z923 Personal history of irradiation: Secondary | ICD-10-CM | POA: Diagnosis not present

## 2016-12-13 DIAGNOSIS — J383 Other diseases of vocal cords: Secondary | ICD-10-CM | POA: Diagnosis not present

## 2016-12-13 DIAGNOSIS — Z8521 Personal history of malignant neoplasm of larynx: Secondary | ICD-10-CM | POA: Diagnosis not present

## 2016-12-13 DIAGNOSIS — H6121 Impacted cerumen, right ear: Secondary | ICD-10-CM | POA: Diagnosis not present

## 2016-12-13 DIAGNOSIS — R490 Dysphonia: Secondary | ICD-10-CM | POA: Diagnosis not present

## 2016-12-13 DIAGNOSIS — Z9889 Other specified postprocedural states: Secondary | ICD-10-CM | POA: Diagnosis not present

## 2017-02-24 DIAGNOSIS — E039 Hypothyroidism, unspecified: Secondary | ICD-10-CM | POA: Diagnosis not present

## 2017-02-24 DIAGNOSIS — I10 Essential (primary) hypertension: Secondary | ICD-10-CM | POA: Diagnosis not present

## 2017-02-24 DIAGNOSIS — R7301 Impaired fasting glucose: Secondary | ICD-10-CM | POA: Diagnosis not present

## 2017-03-01 DIAGNOSIS — Z6822 Body mass index (BMI) 22.0-22.9, adult: Secondary | ICD-10-CM | POA: Diagnosis not present

## 2017-03-01 DIAGNOSIS — M816 Localized osteoporosis [Lequesne]: Secondary | ICD-10-CM | POA: Diagnosis not present

## 2017-03-01 DIAGNOSIS — Z0000 Encounter for general adult medical examination without abnormal findings: Secondary | ICD-10-CM | POA: Diagnosis not present

## 2017-03-01 DIAGNOSIS — I10 Essential (primary) hypertension: Secondary | ICD-10-CM | POA: Diagnosis not present

## 2017-03-01 DIAGNOSIS — E039 Hypothyroidism, unspecified: Secondary | ICD-10-CM | POA: Diagnosis not present

## 2017-03-01 DIAGNOSIS — R7301 Impaired fasting glucose: Secondary | ICD-10-CM | POA: Diagnosis not present

## 2017-05-12 DIAGNOSIS — Z950 Presence of cardiac pacemaker: Secondary | ICD-10-CM | POA: Diagnosis not present

## 2017-05-12 DIAGNOSIS — I495 Sick sinus syndrome: Secondary | ICD-10-CM | POA: Diagnosis not present

## 2017-06-29 DIAGNOSIS — Z23 Encounter for immunization: Secondary | ICD-10-CM | POA: Diagnosis not present

## 2017-09-04 DIAGNOSIS — R7301 Impaired fasting glucose: Secondary | ICD-10-CM | POA: Diagnosis not present

## 2017-09-04 DIAGNOSIS — E039 Hypothyroidism, unspecified: Secondary | ICD-10-CM | POA: Diagnosis not present

## 2017-09-04 DIAGNOSIS — I10 Essential (primary) hypertension: Secondary | ICD-10-CM | POA: Diagnosis not present

## 2017-09-06 DIAGNOSIS — R7301 Impaired fasting glucose: Secondary | ICD-10-CM | POA: Diagnosis not present

## 2017-09-06 DIAGNOSIS — Z634 Disappearance and death of family member: Secondary | ICD-10-CM | POA: Diagnosis not present

## 2017-09-06 DIAGNOSIS — I10 Essential (primary) hypertension: Secondary | ICD-10-CM | POA: Diagnosis not present

## 2017-09-06 DIAGNOSIS — Z950 Presence of cardiac pacemaker: Secondary | ICD-10-CM | POA: Diagnosis not present

## 2017-09-06 DIAGNOSIS — H9193 Unspecified hearing loss, bilateral: Secondary | ICD-10-CM | POA: Diagnosis not present

## 2017-09-06 DIAGNOSIS — M816 Localized osteoporosis [Lequesne]: Secondary | ICD-10-CM | POA: Diagnosis not present

## 2017-09-06 DIAGNOSIS — E039 Hypothyroidism, unspecified: Secondary | ICD-10-CM | POA: Diagnosis not present

## 2017-10-18 DIAGNOSIS — I10 Essential (primary) hypertension: Secondary | ICD-10-CM | POA: Diagnosis not present

## 2017-10-18 DIAGNOSIS — R002 Palpitations: Secondary | ICD-10-CM | POA: Diagnosis not present

## 2017-10-18 DIAGNOSIS — R7301 Impaired fasting glucose: Secondary | ICD-10-CM | POA: Diagnosis not present

## 2017-10-18 DIAGNOSIS — Z634 Disappearance and death of family member: Secondary | ICD-10-CM | POA: Diagnosis not present

## 2017-10-18 DIAGNOSIS — M816 Localized osteoporosis [Lequesne]: Secondary | ICD-10-CM | POA: Diagnosis not present

## 2017-10-18 DIAGNOSIS — Z950 Presence of cardiac pacemaker: Secondary | ICD-10-CM | POA: Diagnosis not present

## 2017-10-18 DIAGNOSIS — H9193 Unspecified hearing loss, bilateral: Secondary | ICD-10-CM | POA: Diagnosis not present

## 2017-10-18 DIAGNOSIS — E039 Hypothyroidism, unspecified: Secondary | ICD-10-CM | POA: Diagnosis not present

## 2017-10-18 DIAGNOSIS — Z6822 Body mass index (BMI) 22.0-22.9, adult: Secondary | ICD-10-CM | POA: Diagnosis not present

## 2017-11-15 DIAGNOSIS — Z950 Presence of cardiac pacemaker: Secondary | ICD-10-CM | POA: Diagnosis not present

## 2017-11-15 DIAGNOSIS — I495 Sick sinus syndrome: Secondary | ICD-10-CM | POA: Diagnosis not present

## 2017-11-15 DIAGNOSIS — G9001 Carotid sinus syncope: Secondary | ICD-10-CM | POA: Diagnosis not present

## 2017-11-15 DIAGNOSIS — I10 Essential (primary) hypertension: Secondary | ICD-10-CM | POA: Diagnosis not present

## 2017-11-15 DIAGNOSIS — Z79899 Other long term (current) drug therapy: Secondary | ICD-10-CM | POA: Diagnosis not present

## 2017-11-15 DIAGNOSIS — I6523 Occlusion and stenosis of bilateral carotid arteries: Secondary | ICD-10-CM | POA: Diagnosis not present

## 2017-11-19 DIAGNOSIS — I495 Sick sinus syndrome: Secondary | ICD-10-CM | POA: Diagnosis not present

## 2017-11-19 DIAGNOSIS — Z45018 Encounter for adjustment and management of other part of cardiac pacemaker: Secondary | ICD-10-CM | POA: Diagnosis not present

## 2017-12-28 DIAGNOSIS — E039 Hypothyroidism, unspecified: Secondary | ICD-10-CM | POA: Diagnosis not present

## 2018-01-01 DIAGNOSIS — I10 Essential (primary) hypertension: Secondary | ICD-10-CM | POA: Diagnosis not present

## 2018-01-01 DIAGNOSIS — M816 Localized osteoporosis [Lequesne]: Secondary | ICD-10-CM | POA: Diagnosis not present

## 2018-01-01 DIAGNOSIS — Z6821 Body mass index (BMI) 21.0-21.9, adult: Secondary | ICD-10-CM | POA: Diagnosis not present

## 2018-01-01 DIAGNOSIS — R002 Palpitations: Secondary | ICD-10-CM | POA: Diagnosis not present

## 2018-01-01 DIAGNOSIS — Z950 Presence of cardiac pacemaker: Secondary | ICD-10-CM | POA: Diagnosis not present

## 2018-01-01 DIAGNOSIS — Z6822 Body mass index (BMI) 22.0-22.9, adult: Secondary | ICD-10-CM | POA: Diagnosis not present

## 2018-01-01 DIAGNOSIS — H9193 Unspecified hearing loss, bilateral: Secondary | ICD-10-CM | POA: Diagnosis not present

## 2018-01-01 DIAGNOSIS — Z634 Disappearance and death of family member: Secondary | ICD-10-CM | POA: Diagnosis not present

## 2018-01-01 DIAGNOSIS — E039 Hypothyroidism, unspecified: Secondary | ICD-10-CM | POA: Diagnosis not present

## 2018-01-01 DIAGNOSIS — R7301 Impaired fasting glucose: Secondary | ICD-10-CM | POA: Diagnosis not present

## 2018-02-05 DIAGNOSIS — R7301 Impaired fasting glucose: Secondary | ICD-10-CM | POA: Diagnosis not present

## 2018-02-05 DIAGNOSIS — M816 Localized osteoporosis [Lequesne]: Secondary | ICD-10-CM | POA: Diagnosis not present

## 2018-02-05 DIAGNOSIS — Z950 Presence of cardiac pacemaker: Secondary | ICD-10-CM | POA: Diagnosis not present

## 2018-02-05 DIAGNOSIS — H9193 Unspecified hearing loss, bilateral: Secondary | ICD-10-CM | POA: Diagnosis not present

## 2018-02-05 DIAGNOSIS — Z634 Disappearance and death of family member: Secondary | ICD-10-CM | POA: Diagnosis not present

## 2018-02-05 DIAGNOSIS — I10 Essential (primary) hypertension: Secondary | ICD-10-CM | POA: Diagnosis not present

## 2018-02-05 DIAGNOSIS — Z6822 Body mass index (BMI) 22.0-22.9, adult: Secondary | ICD-10-CM | POA: Diagnosis not present

## 2018-02-05 DIAGNOSIS — R002 Palpitations: Secondary | ICD-10-CM | POA: Diagnosis not present

## 2018-02-05 DIAGNOSIS — E039 Hypothyroidism, unspecified: Secondary | ICD-10-CM | POA: Diagnosis not present

## 2018-02-12 DIAGNOSIS — I495 Sick sinus syndrome: Secondary | ICD-10-CM | POA: Diagnosis not present

## 2018-02-12 DIAGNOSIS — Z45010 Encounter for checking and testing of cardiac pacemaker pulse generator [battery]: Secondary | ICD-10-CM | POA: Diagnosis not present

## 2018-02-13 DIAGNOSIS — I495 Sick sinus syndrome: Secondary | ICD-10-CM | POA: Diagnosis not present

## 2018-02-13 DIAGNOSIS — Z45018 Encounter for adjustment and management of other part of cardiac pacemaker: Secondary | ICD-10-CM | POA: Diagnosis not present

## 2018-05-02 ENCOUNTER — Other Ambulatory Visit: Payer: Self-pay

## 2018-05-14 DIAGNOSIS — Z45018 Encounter for adjustment and management of other part of cardiac pacemaker: Secondary | ICD-10-CM | POA: Diagnosis not present

## 2018-05-14 DIAGNOSIS — I495 Sick sinus syndrome: Secondary | ICD-10-CM | POA: Diagnosis not present

## 2018-07-04 DIAGNOSIS — Z23 Encounter for immunization: Secondary | ICD-10-CM | POA: Diagnosis not present

## 2018-08-06 DIAGNOSIS — I10 Essential (primary) hypertension: Secondary | ICD-10-CM | POA: Diagnosis not present

## 2018-08-06 DIAGNOSIS — E039 Hypothyroidism, unspecified: Secondary | ICD-10-CM | POA: Diagnosis not present

## 2018-08-06 DIAGNOSIS — R7301 Impaired fasting glucose: Secondary | ICD-10-CM | POA: Diagnosis not present

## 2018-08-07 DIAGNOSIS — I10 Essential (primary) hypertension: Secondary | ICD-10-CM | POA: Diagnosis not present

## 2018-08-07 DIAGNOSIS — E039 Hypothyroidism, unspecified: Secondary | ICD-10-CM | POA: Diagnosis not present

## 2018-08-07 DIAGNOSIS — K219 Gastro-esophageal reflux disease without esophagitis: Secondary | ICD-10-CM | POA: Diagnosis not present

## 2018-08-07 DIAGNOSIS — H9193 Unspecified hearing loss, bilateral: Secondary | ICD-10-CM | POA: Diagnosis not present

## 2018-08-07 DIAGNOSIS — M816 Localized osteoporosis [Lequesne]: Secondary | ICD-10-CM | POA: Diagnosis not present

## 2018-08-07 DIAGNOSIS — Z950 Presence of cardiac pacemaker: Secondary | ICD-10-CM | POA: Diagnosis not present

## 2018-08-13 DIAGNOSIS — Z4509 Encounter for adjustment and management of other cardiac device: Secondary | ICD-10-CM | POA: Diagnosis not present

## 2018-08-13 DIAGNOSIS — Z45018 Encounter for adjustment and management of other part of cardiac pacemaker: Secondary | ICD-10-CM | POA: Diagnosis not present

## 2018-08-13 DIAGNOSIS — I495 Sick sinus syndrome: Secondary | ICD-10-CM | POA: Diagnosis not present

## 2018-08-23 ENCOUNTER — Other Ambulatory Visit: Payer: Self-pay

## 2018-11-20 DIAGNOSIS — Z4509 Encounter for adjustment and management of other cardiac device: Secondary | ICD-10-CM | POA: Diagnosis not present

## 2018-11-20 DIAGNOSIS — I495 Sick sinus syndrome: Secondary | ICD-10-CM | POA: Diagnosis not present

## 2018-11-21 DIAGNOSIS — I10 Essential (primary) hypertension: Secondary | ICD-10-CM | POA: Diagnosis not present

## 2018-11-28 DIAGNOSIS — K219 Gastro-esophageal reflux disease without esophagitis: Secondary | ICD-10-CM | POA: Diagnosis not present

## 2018-11-28 DIAGNOSIS — I10 Essential (primary) hypertension: Secondary | ICD-10-CM | POA: Diagnosis not present

## 2018-11-28 DIAGNOSIS — M810 Age-related osteoporosis without current pathological fracture: Secondary | ICD-10-CM | POA: Diagnosis not present

## 2018-11-28 DIAGNOSIS — R944 Abnormal results of kidney function studies: Secondary | ICD-10-CM | POA: Diagnosis not present

## 2018-11-28 DIAGNOSIS — E039 Hypothyroidism, unspecified: Secondary | ICD-10-CM | POA: Diagnosis not present

## 2018-12-09 DIAGNOSIS — Z45018 Encounter for adjustment and management of other part of cardiac pacemaker: Secondary | ICD-10-CM | POA: Diagnosis not present

## 2018-12-09 DIAGNOSIS — G9001 Carotid sinus syncope: Secondary | ICD-10-CM | POA: Diagnosis not present

## 2019-02-20 DIAGNOSIS — I495 Sick sinus syndrome: Secondary | ICD-10-CM | POA: Diagnosis not present

## 2019-02-20 DIAGNOSIS — Z4509 Encounter for adjustment and management of other cardiac device: Secondary | ICD-10-CM | POA: Diagnosis not present

## 2019-02-20 DIAGNOSIS — M816 Localized osteoporosis [Lequesne]: Secondary | ICD-10-CM | POA: Diagnosis not present

## 2019-02-20 DIAGNOSIS — Z950 Presence of cardiac pacemaker: Secondary | ICD-10-CM | POA: Diagnosis not present

## 2019-02-20 DIAGNOSIS — I10 Essential (primary) hypertension: Secondary | ICD-10-CM | POA: Diagnosis not present

## 2019-02-20 DIAGNOSIS — H9193 Unspecified hearing loss, bilateral: Secondary | ICD-10-CM | POA: Diagnosis not present

## 2019-02-20 DIAGNOSIS — E039 Hypothyroidism, unspecified: Secondary | ICD-10-CM | POA: Diagnosis not present

## 2019-02-27 DIAGNOSIS — K219 Gastro-esophageal reflux disease without esophagitis: Secondary | ICD-10-CM | POA: Diagnosis not present

## 2019-02-27 DIAGNOSIS — M810 Age-related osteoporosis without current pathological fracture: Secondary | ICD-10-CM | POA: Diagnosis not present

## 2019-02-27 DIAGNOSIS — I10 Essential (primary) hypertension: Secondary | ICD-10-CM | POA: Diagnosis not present

## 2019-02-27 DIAGNOSIS — R944 Abnormal results of kidney function studies: Secondary | ICD-10-CM | POA: Diagnosis not present

## 2019-03-05 DIAGNOSIS — Z0000 Encounter for general adult medical examination without abnormal findings: Secondary | ICD-10-CM | POA: Diagnosis not present

## 2019-03-08 ENCOUNTER — Other Ambulatory Visit: Payer: Self-pay

## 2019-03-08 ENCOUNTER — Inpatient Hospital Stay (HOSPITAL_COMMUNITY)
Admission: EM | Admit: 2019-03-08 | Discharge: 2019-03-11 | DRG: 481 | Disposition: A | Discharge status: Skilled Nursing Facility | Payer: Medicare Other | Attending: Internal Medicine | Admitting: Internal Medicine

## 2019-03-08 ENCOUNTER — Encounter (HOSPITAL_COMMUNITY): Payer: Self-pay | Admitting: Emergency Medicine

## 2019-03-08 ENCOUNTER — Emergency Department (HOSPITAL_COMMUNITY)
Admission: EM | Admit: 2019-03-08 | Discharge: 2019-03-08 | Discharge status: Absent without leave | Payer: Self-pay | Attending: Emergency Medicine | Admitting: Emergency Medicine

## 2019-03-08 DIAGNOSIS — F0390 Unspecified dementia without behavioral disturbance: Secondary | ICD-10-CM | POA: Diagnosis present

## 2019-03-08 DIAGNOSIS — I6523 Occlusion and stenosis of bilateral carotid arteries: Secondary | ICD-10-CM | POA: Diagnosis present

## 2019-03-08 DIAGNOSIS — Z79890 Hormone replacement therapy (postmenopausal): Secondary | ICD-10-CM

## 2019-03-08 DIAGNOSIS — M25551 Pain in right hip: Secondary | ICD-10-CM | POA: Diagnosis not present

## 2019-03-08 DIAGNOSIS — W19XXXA Unspecified fall, initial encounter: Secondary | ICD-10-CM | POA: Diagnosis not present

## 2019-03-08 DIAGNOSIS — W1830XA Fall on same level, unspecified, initial encounter: Secondary | ICD-10-CM | POA: Diagnosis present

## 2019-03-08 DIAGNOSIS — E860 Dehydration: Secondary | ICD-10-CM | POA: Diagnosis present

## 2019-03-08 DIAGNOSIS — I10 Essential (primary) hypertension: Secondary | ICD-10-CM | POA: Diagnosis present

## 2019-03-08 DIAGNOSIS — Z20828 Contact with and (suspected) exposure to other viral communicable diseases: Secondary | ICD-10-CM | POA: Diagnosis not present

## 2019-03-08 DIAGNOSIS — Z880 Allergy status to penicillin: Secondary | ICD-10-CM

## 2019-03-08 DIAGNOSIS — K219 Gastro-esophageal reflux disease without esophagitis: Secondary | ICD-10-CM | POA: Diagnosis not present

## 2019-03-08 DIAGNOSIS — S299XXA Unspecified injury of thorax, initial encounter: Secondary | ICD-10-CM | POA: Diagnosis not present

## 2019-03-08 DIAGNOSIS — Z888 Allergy status to other drugs, medicaments and biological substances status: Secondary | ICD-10-CM

## 2019-03-08 DIAGNOSIS — Z8521 Personal history of malignant neoplasm of larynx: Secondary | ICD-10-CM

## 2019-03-08 DIAGNOSIS — R52 Pain, unspecified: Secondary | ICD-10-CM

## 2019-03-08 DIAGNOSIS — N179 Acute kidney failure, unspecified: Secondary | ICD-10-CM | POA: Diagnosis not present

## 2019-03-08 DIAGNOSIS — R739 Hyperglycemia, unspecified: Secondary | ICD-10-CM | POA: Diagnosis present

## 2019-03-08 DIAGNOSIS — N400 Enlarged prostate without lower urinary tract symptoms: Secondary | ICD-10-CM | POA: Diagnosis present

## 2019-03-08 DIAGNOSIS — M810 Age-related osteoporosis without current pathological fracture: Secondary | ICD-10-CM | POA: Diagnosis present

## 2019-03-08 DIAGNOSIS — R1310 Dysphagia, unspecified: Secondary | ICD-10-CM | POA: Diagnosis present

## 2019-03-08 DIAGNOSIS — Z8781 Personal history of (healed) traumatic fracture: Secondary | ICD-10-CM

## 2019-03-08 DIAGNOSIS — S72143A Displaced intertrochanteric fracture of unspecified femur, initial encounter for closed fracture: Secondary | ICD-10-CM | POA: Diagnosis present

## 2019-03-08 DIAGNOSIS — S72141A Displaced intertrochanteric fracture of right femur, initial encounter for closed fracture: Principal | ICD-10-CM | POA: Diagnosis present

## 2019-03-08 DIAGNOSIS — Z885 Allergy status to narcotic agent status: Secondary | ICD-10-CM

## 2019-03-08 DIAGNOSIS — Z8501 Personal history of malignant neoplasm of esophagus: Secondary | ICD-10-CM

## 2019-03-08 DIAGNOSIS — Z03818 Encounter for observation for suspected exposure to other biological agents ruled out: Secondary | ICD-10-CM | POA: Diagnosis not present

## 2019-03-08 DIAGNOSIS — E039 Hypothyroidism, unspecified: Secondary | ICD-10-CM | POA: Diagnosis present

## 2019-03-08 DIAGNOSIS — Z923 Personal history of irradiation: Secondary | ICD-10-CM

## 2019-03-08 DIAGNOSIS — Z7983 Long term (current) use of bisphosphonates: Secondary | ICD-10-CM

## 2019-03-08 HISTORY — DX: Benign prostatic hyperplasia without lower urinary tract symptoms: N40.0

## 2019-03-08 HISTORY — DX: Occlusion and stenosis of bilateral carotid arteries: I65.23

## 2019-03-08 HISTORY — DX: Age-related osteoporosis without current pathological fracture: M81.0

## 2019-03-08 HISTORY — DX: Carotid sinus syncope: G90.01

## 2019-03-08 HISTORY — DX: Unspecified dementia, unspecified severity, without behavioral disturbance, psychotic disturbance, mood disturbance, and anxiety: F03.90

## 2019-03-08 NOTE — ED Provider Notes (Signed)
Emergency Department Provider Note   I have reviewed the triage vital signs and the nursing notes.   HISTORY  Chief Complaint Fall   HPI Phillip Taylor is a 85 y.o. male with medical problems documented below who presents the emergency department today secondary to a fall.  Patient does not necessarily remember the fall but he knows that his right hip hurts really bad.  He lives by himself.  EMS states that he refused to move his right hip secondary to pain.  Also complains of skin tear in his right hand but no other complaints.  Did not hit his head or pass out.   His Son is power of attorney, requests consent/medical treatment discussed with him, number:  Phillip Taylor; [phone]  No other associated or modifying symptoms.    Past Medical History:  Diagnosis Date  . BPH (benign prostatic hyperplasia)   . Carotid sinus hypersensitivity   . Carotid stenosis, asymptomatic, bilateral   . GERD (gastroesophageal reflux disease)   . Hypertension   . Hypothyroidism   . Osteoporosis   . PONV (postoperative nausea and vomiting)   . Vocal cord cancer (HCC)    at least 13 years ago    Patient Active Problem List   Diagnosis Date Noted  . Intertrochanteric fracture of right femur, closed, initial encounter ([place]) 03/09/2019  . Dehydration 03/09/2019  . Osteoporosis 03/09/2019  . Essential hypertension 03/09/2019  . Hypothyroidism 03/09/2019  . Heme positive stool 10/17/2012    Past Surgical History:  Procedure Laterality Date  . COLONOSCOPY  10/25/2012   Procedure: COLONOSCOPY;  Surgeon: Daneil Dolin, MD;  Location: [facility] SUITE;  Service: Endoscopy;  Laterality: N/A;  1:30  . HERNIA REPAIR    . NISSEN FUNDOPLICATION    . PACEMAKER INSERTION    . TRANSURETHRAL RESECTION OF PROSTATE    . vocal cord cancer removal      Current Outpatient Rx  . Order #: [id number] Class: Historical Med  . Order #: [id number] Class: Historical Med  . Order #: [id number] Class: Historical Med  .  Order #: [id number] Class: Historical Med    Allergies Prevacid [lansoprazole]; Prilosec [omeprazole]; Codeine; and Penicillins  Family History  Problem Relation Age of Onset  . Colon cancer Neg Hx     Social History Social History   Tobacco Use  . Smoking status: Never Smoker  . Smokeless tobacco: Never Used  Substance Use Topics  . Alcohol use: No  . Drug use: No    Review of Systems  All other systems negative except as documented in the HPI. All pertinent positives and negatives as reviewed in the HPI. ____________________________________________   PHYSICAL EXAM:  VITAL SIGNS: ED Triage Vitals [03/08/19 2357]  Enc Vitals Group     BP      Pulse      Resp      Temp      Temp src      SpO2      Weight 140 lb (63.5 kg)     Height 5\' 9"  (1.753 m)     Head Circumference      Peak Flow      Pain Score      Pain Loc      Pain Edu?      Excl. in [place]?     Constitutional: Alert and oriented. Well appearing and in no acute distress. Eyes: Conjunctivae are normal. PERRL. EOMI. Head: Atraumatic. Nose: No congestion/rhinnorhea. Mouth/Throat: Mucous membranes are moist.  Oropharynx non-erythematous.  Neck: No stridor.  No meningeal signs.   Cardiovascular: Normal rate, regular rhythm. Good peripheral circulation. Grossly normal heart sounds.   Respiratory: Normal respiratory effort.  No retractions. Lungs CTAB. Gastrointestinal: Soft and nontender. No distention.  Musculoskeletal: No lower extremity tenderness nor edema.  Holding his right leg with hip and knee flexion outwardly rotated.  Significant pain in right lateral hip with any range of motion attempt.  Palpation and range of motion of left lower extremity, bilateral upper extremities, anterior and lateral chest, cervical spine without any evidence of tenderness.   Neurologic:  Normal speech and language. No gross focal neurologic deficits are appreciated.  Skin:  No rash noted. Notes a skin tear on his right hand.    ____________________________________________   LABS (all labs ordered are listed, but only abnormal results are displayed)  Labs Reviewed  BASIC METABOLIC PANEL - Abnormal; Notable for the following components:      Result Value   Glucose, Bld 188 (*)    BUN 28 (*)    Creatinine, Ser 1.46 (*)    Calcium 8.7 (*)    GFR calc non Af Amer 41 (*)    GFR calc Af Amer 48 (*)    All other components within normal limits  CBC WITH DIFFERENTIAL/PLATELET - Abnormal; Notable for the following components:   WBC 12.6 (*)    MCV 100.7 (*)    Neutro Abs 10.8 (*)    All other components within normal limits  SARS CORONAVIRUS 2 (HOSPITAL ORDER, [facility])  PROTIME-INR  TROPONIN I  URINALYSIS, ROUTINE W REFLEX MICROSCOPIC  TYPE AND SCREEN   ____________________________________________  EKG   EKG Interpretation  Date/Time:  Friday March 08 2019 23:50:47 EDT Ventricular Rate:  85 PR Interval:    QRS Duration: 101 QT Interval:  409 QTC Calculation: 487 R Axis:   68 Text Interpretation:  Sinus rhythm Prolonged PR interval Probable left atrial enlargement Borderline low voltage, extremity leads Anteroseptal infarct, old Nonspecific T abnormalities, inferior leads No old tracing to compare Confirmed by Merrily Pew [phone]) on 03/08/2019 11:58:56 PM       ____________________________________________  RADIOLOGY  Dg Chest 1 View  Result Date: 03/09/2019 CLINICAL DATA:  Fall EXAM: CHEST  1 VIEW COMPARISON:  None. FINDINGS: The heart size and mediastinal contours are within normal limits. Both lungs are clear. The visualized skeletal structures are unremarkable. Left chest wall pacemaker. IMPRESSION: No active disease. Electronically Signed   By: Ulyses Jarred M.D.   On: 03/09/2019 01:54   Dg Hip Unilat With Pelvis 2-3 Views Right  Result Date: 03/09/2019 CLINICAL DATA:  85 year old male with fall and right hip fracture. EXAM: DG HIP (WITH OR WITHOUT PELVIS) 2-3V  RIGHT COMPARISON:  None. FINDINGS: There is a comminuted intertrochanteric fracture of the right femoral neck with mild valgus angulation. There is no dislocation. The bones are osteopenic. A hernia repair mesh is noted. The soft tissues are unremarkable. IMPRESSION: Comminuted intertrochanteric fracture of the right femoral neck. Electronically Signed   By: Anner Crete M.D.   On: 03/09/2019 01:55    ____________________________________________   PROCEDURES  Procedure(s) performed:   Procedures   ____________________________________________   INITIAL IMPRESSION / ASSESSMENT AND PLAN / ED COURSE  Likely hip fracture will check labs, EKG and chest x-ray with anticipation of admission for orthopedic consultation.  Found to have intertrochanteric fracture. Discussed with Dr. Aline Brochure, plan for tentative surgery tomorrow here at [place]. NPO. Discussed with Dr. Maudie Mercury. Will  admit.   His Son is power of attorney, requests consent/medical treatment discussed with him, number:  Shana Younge; [phone]  Pertinent labs & imaging results that were available during my care of the patient were reviewed by me and considered in my medical decision making (see chart for details).   ____________________________________________  FINAL CLINICAL IMPRESSION(S) / ED DIAGNOSES  Final diagnoses:  Fall  Pain     MEDICATIONS GIVEN DURING THIS VISIT:  Medications - No data to display   NEW OUTPATIENT MEDICATIONS STARTED DURING THIS VISIT:  New Prescriptions   No medications on file    Note:  This note was prepared with assistance of Dragon voice recognition software. Occasional wrong-word or sound-a-like substitutions may have occurred due to the inherent limitations of voice recognition software.   Caysie Minnifield, Corene Cornea, MD 03/09/19 [phone]

## 2019-03-08 NOTE — ED Triage Notes (Signed)
[facility] - pt slipped out of bed. Having pain in right hip and cannot straighten it. Skin tears to right hand and forearm

## 2019-03-08 NOTE — ED Triage Notes (Signed)
[facility] - slipped off of bed. Pt can't straighten his leg, states it causes too much pain. Has skin tear on right hand and forearm

## 2019-03-09 ENCOUNTER — Emergency Department (HOSPITAL_COMMUNITY): Payer: Medicare Other

## 2019-03-09 ENCOUNTER — Inpatient Hospital Stay (HOSPITAL_COMMUNITY): Payer: Medicare Other | Admitting: Anesthesiology

## 2019-03-09 ENCOUNTER — Other Ambulatory Visit (HOSPITAL_COMMUNITY): Payer: Medicare Other

## 2019-03-09 ENCOUNTER — Inpatient Hospital Stay (HOSPITAL_COMMUNITY): Payer: Medicare Other

## 2019-03-09 ENCOUNTER — Encounter (HOSPITAL_COMMUNITY)
Admission: EM | Disposition: A | Discharge status: Skilled Nursing Facility | Payer: Self-pay | Source: Home / Self Care | Attending: Internal Medicine

## 2019-03-09 ENCOUNTER — Encounter (HOSPITAL_COMMUNITY): Payer: Self-pay | Admitting: Internal Medicine

## 2019-03-09 DIAGNOSIS — Z888 Allergy status to other drugs, medicaments and biological substances status: Secondary | ICD-10-CM | POA: Diagnosis not present

## 2019-03-09 DIAGNOSIS — R739 Hyperglycemia, unspecified: Secondary | ICD-10-CM | POA: Diagnosis present

## 2019-03-09 DIAGNOSIS — I10 Essential (primary) hypertension: Secondary | ICD-10-CM | POA: Diagnosis present

## 2019-03-09 DIAGNOSIS — Z880 Allergy status to penicillin: Secondary | ICD-10-CM | POA: Diagnosis not present

## 2019-03-09 DIAGNOSIS — E039 Hypothyroidism, unspecified: Secondary | ICD-10-CM | POA: Diagnosis present

## 2019-03-09 DIAGNOSIS — Z20828 Contact with and (suspected) exposure to other viral communicable diseases: Secondary | ICD-10-CM | POA: Diagnosis present

## 2019-03-09 DIAGNOSIS — F0390 Unspecified dementia without behavioral disturbance: Secondary | ICD-10-CM | POA: Diagnosis present

## 2019-03-09 DIAGNOSIS — M25551 Pain in right hip: Secondary | ICD-10-CM | POA: Diagnosis not present

## 2019-03-09 DIAGNOSIS — K219 Gastro-esophageal reflux disease without esophagitis: Secondary | ICD-10-CM | POA: Diagnosis present

## 2019-03-09 DIAGNOSIS — Z8521 Personal history of malignant neoplasm of larynx: Secondary | ICD-10-CM | POA: Diagnosis not present

## 2019-03-09 DIAGNOSIS — M810 Age-related osteoporosis without current pathological fracture: Secondary | ICD-10-CM | POA: Diagnosis not present

## 2019-03-09 DIAGNOSIS — W1830XA Fall on same level, unspecified, initial encounter: Secondary | ICD-10-CM | POA: Diagnosis present

## 2019-03-09 DIAGNOSIS — E860 Dehydration: Secondary | ICD-10-CM

## 2019-03-09 DIAGNOSIS — M6281 Muscle weakness (generalized): Secondary | ICD-10-CM | POA: Diagnosis not present

## 2019-03-09 DIAGNOSIS — S72141A Displaced intertrochanteric fracture of right femur, initial encounter for closed fracture: Secondary | ICD-10-CM | POA: Diagnosis present

## 2019-03-09 DIAGNOSIS — S299XXA Unspecified injury of thorax, initial encounter: Secondary | ICD-10-CM | POA: Diagnosis not present

## 2019-03-09 DIAGNOSIS — Z885 Allergy status to narcotic agent status: Secondary | ICD-10-CM | POA: Diagnosis not present

## 2019-03-09 DIAGNOSIS — R41841 Cognitive communication deficit: Secondary | ICD-10-CM | POA: Diagnosis not present

## 2019-03-09 DIAGNOSIS — N179 Acute kidney failure, unspecified: Secondary | ICD-10-CM | POA: Diagnosis present

## 2019-03-09 DIAGNOSIS — N400 Enlarged prostate without lower urinary tract symptoms: Secondary | ICD-10-CM | POA: Diagnosis present

## 2019-03-09 DIAGNOSIS — Z8501 Personal history of malignant neoplasm of esophagus: Secondary | ICD-10-CM | POA: Diagnosis not present

## 2019-03-09 DIAGNOSIS — Z9181 History of falling: Secondary | ICD-10-CM | POA: Diagnosis not present

## 2019-03-09 DIAGNOSIS — Z7983 Long term (current) use of bisphosphonates: Secondary | ICD-10-CM | POA: Diagnosis not present

## 2019-03-09 DIAGNOSIS — S72141D Displaced intertrochanteric fracture of right femur, subsequent encounter for closed fracture with routine healing: Secondary | ICD-10-CM | POA: Diagnosis not present

## 2019-03-09 DIAGNOSIS — I6523 Occlusion and stenosis of bilateral carotid arteries: Secondary | ICD-10-CM | POA: Diagnosis present

## 2019-03-09 DIAGNOSIS — Z923 Personal history of irradiation: Secondary | ICD-10-CM | POA: Diagnosis not present

## 2019-03-09 DIAGNOSIS — S72143A Displaced intertrochanteric fracture of unspecified femur, initial encounter for closed fracture: Secondary | ICD-10-CM | POA: Diagnosis present

## 2019-03-09 DIAGNOSIS — Z741 Need for assistance with personal care: Secondary | ICD-10-CM | POA: Diagnosis not present

## 2019-03-09 DIAGNOSIS — Z87438 Personal history of other diseases of male genital organs: Secondary | ICD-10-CM | POA: Diagnosis not present

## 2019-03-09 DIAGNOSIS — R262 Difficulty in walking, not elsewhere classified: Secondary | ICD-10-CM | POA: Diagnosis not present

## 2019-03-09 DIAGNOSIS — Z4789 Encounter for other orthopedic aftercare: Secondary | ICD-10-CM | POA: Diagnosis not present

## 2019-03-09 DIAGNOSIS — Z79890 Hormone replacement therapy (postmenopausal): Secondary | ICD-10-CM | POA: Diagnosis not present

## 2019-03-09 DIAGNOSIS — R1310 Dysphagia, unspecified: Secondary | ICD-10-CM | POA: Diagnosis present

## 2019-03-09 DIAGNOSIS — G9001 Carotid sinus syncope: Secondary | ICD-10-CM | POA: Diagnosis not present

## 2019-03-09 HISTORY — PX: INTRAMEDULLARY (IM) NAIL INTERTROCHANTERIC: SHX5875

## 2019-03-09 LAB — CBC WITH DIFFERENTIAL/PLATELET
Abs Immature Granulocytes: 0.05 10*3/uL (ref 0.00–0.07)
Basophils Absolute: 0 10*3/uL (ref 0.0–0.1)
Basophils Relative: 0 %
Eosinophils Absolute: 0 10*3/uL (ref 0.0–0.5)
Eosinophils Relative: 0 %
HCT: 44.8 % (ref 39.0–52.0)
Hemoglobin: 14.6 g/dL (ref 13.0–17.0)
Immature Granulocytes: 0 %
Lymphocytes Relative: 7 %
Lymphs Abs: 0.9 10*3/uL (ref 0.7–4.0)
MCH: 32.8 pg (ref 26.0–34.0)
MCHC: 32.6 g/dL (ref 30.0–36.0)
MCV: 100.7 fL — ABNORMAL HIGH (ref 80.0–100.0)
Monocytes Absolute: 0.9 10*3/uL (ref 0.1–1.0)
Monocytes Relative: 7 %
Neutro Abs: 10.8 10*3/uL — ABNORMAL HIGH (ref 1.7–7.7)
Neutrophils Relative %: 86 %
Platelets: 173 10*3/uL (ref 150–400)
RBC: 4.45 MIL/uL (ref 4.22–5.81)
RDW: 14 % (ref 11.5–15.5)
WBC: 12.6 10*3/uL — ABNORMAL HIGH (ref 4.0–10.5)
nRBC: 0 % (ref 0.0–0.2)

## 2019-03-09 LAB — SURGICAL PCR SCREEN
MRSA, PCR: NEGATIVE
Staphylococcus aureus: NEGATIVE

## 2019-03-09 LAB — BASIC METABOLIC PANEL
Anion gap: 11 (ref 5–15)
BUN: 28 mg/dL — ABNORMAL HIGH (ref 8–23)
CO2: 23 mmol/L (ref 22–32)
Calcium: 8.7 mg/dL — ABNORMAL LOW (ref 8.9–10.3)
Chloride: 105 mmol/L (ref 98–111)
Creatinine, Ser: 1.46 mg/dL — ABNORMAL HIGH (ref 0.61–1.24)
GFR calc Af Amer: 48 mL/min — ABNORMAL LOW (ref >60)
GFR calc non Af Amer: 41 mL/min — ABNORMAL LOW (ref >60)
Glucose, Bld: 188 mg/dL — ABNORMAL HIGH (ref 70–99)
Potassium: 3.5 mmol/L (ref 3.5–5.1)
Sodium: 139 mmol/L (ref 135–145)

## 2019-03-09 LAB — CBC
HCT: 38.9 % — ABNORMAL LOW (ref 39.0–52.0)
Hemoglobin: 13.2 g/dL (ref 13.0–17.0)
MCH: 33.8 pg (ref 26.0–34.0)
MCHC: 33.9 g/dL (ref 30.0–36.0)
MCV: 99.7 fL (ref 80.0–100.0)
Platelets: 157 10*3/uL (ref 150–400)
RBC: 3.9 MIL/uL — ABNORMAL LOW (ref 4.22–5.81)
RDW: 14.1 % (ref 11.5–15.5)
WBC: 10.5 10*3/uL (ref 4.0–10.5)
nRBC: 0 % (ref 0.0–0.2)

## 2019-03-09 LAB — COMPREHENSIVE METABOLIC PANEL
ALT: 24 U/L (ref 0–44)
AST: 35 U/L (ref 15–41)
Albumin: 3.7 g/dL (ref 3.5–5.0)
Alkaline Phosphatase: 82 U/L (ref 38–126)
Anion gap: 13 (ref 5–15)
BUN: 27 mg/dL — ABNORMAL HIGH (ref 8–23)
CO2: 23 mmol/L (ref 22–32)
Calcium: 8.6 mg/dL — ABNORMAL LOW (ref 8.9–10.3)
Chloride: 105 mmol/L (ref 98–111)
Creatinine, Ser: 1.21 mg/dL (ref 0.61–1.24)
GFR calc Af Amer: 60 mL/min — ABNORMAL LOW (ref >60)
GFR calc non Af Amer: 52 mL/min — ABNORMAL LOW (ref >60)
Glucose, Bld: 168 mg/dL — ABNORMAL HIGH (ref 70–99)
Potassium: 3.7 mmol/L (ref 3.5–5.1)
Sodium: 141 mmol/L (ref 135–145)
Total Bilirubin: 1.1 mg/dL (ref 0.3–1.2)
Total Protein: 6.8 g/dL (ref 6.5–8.1)

## 2019-03-09 LAB — HEMOGLOBIN A1C
Hgb A1c MFr Bld: 5.3 % (ref 4.8–5.6)
Mean Plasma Glucose: 105.41 mg/dL

## 2019-03-09 LAB — ABO/RH: ABO/RH(D): A POS

## 2019-03-09 LAB — PROTIME-INR
INR: 1.1 (ref 0.8–1.2)
Prothrombin Time: 14.1 seconds (ref 11.4–15.2)

## 2019-03-09 LAB — SARS CORONAVIRUS 2 BY RT PCR (HOSPITAL ORDER, PERFORMED IN ~~LOC~~ HOSPITAL LAB): SARS Coronavirus 2: NEGATIVE

## 2019-03-09 LAB — TROPONIN I: Troponin I: <0.03 ng/mL (ref <0.03)

## 2019-03-09 LAB — PREPARE RBC (CROSSMATCH)

## 2019-03-09 SURGERY — FIXATION, FRACTURE, INTERTROCHANTERIC, WITH INTRAMEDULLARY ROD
Anesthesia: General | Laterality: Right

## 2019-03-09 MED ORDER — SODIUM CHLORIDE 0.9 % IV SOLN
INTRAVENOUS | Status: DC
  Administered 2019-03-09 (×2): via INTRAVENOUS

## 2019-03-09 MED ORDER — BUPIVACAINE-EPINEPHRINE (PF) 0.5% -1:200000 IJ SOLN
INTRAMUSCULAR | Status: AC
  Filled 2019-03-09: qty 60

## 2019-03-09 MED ORDER — ONDANSETRON HCL 4 MG/2ML IJ SOLN: 4.0000 mg | Freq: Four times a day (QID) | INTRAMUSCULAR | Status: DC | PRN

## 2019-03-09 MED ORDER — DIPHENHYDRAMINE HCL 50 MG/ML IJ SOLN: 25.0000 mg | Freq: Four times a day (QID) | INTRAMUSCULAR | Status: DC | PRN

## 2019-03-09 MED ORDER — VANCOMYCIN HCL IN DEXTROSE 1-5 GM/200ML-% IV SOLN: 1000.0000 mg | INTRAVENOUS | Status: DC

## 2019-03-09 MED ORDER — FENTANYL CITRATE (PF) 100 MCG/2ML IJ SOLN
50.0000 ug | Freq: Once | INTRAMUSCULAR | Status: AC
  Administered 2019-03-09: 50 ug via INTRAVENOUS
  Filled 2019-03-09: qty 2

## 2019-03-09 MED ORDER — MIDAZOLAM HCL 2 MG/2ML IJ SOLN
INTRAMUSCULAR | Status: DC | PRN
  Administered 2019-03-09: 1 mg via INTRAVENOUS

## 2019-03-09 MED ORDER — VANCOMYCIN HCL 1000 MG IV SOLR
INTRAVENOUS | Status: DC | PRN
  Administered 2019-03-09: 1000 mg via INTRAVENOUS

## 2019-03-09 MED ORDER — LEVOTHYROXINE SODIUM 75 MCG PO TABS
75.0000 ug | ORAL_TABLET | Freq: Every day | ORAL | Status: DC
  Administered 2019-03-10 – 2019-03-11 (×2): 75 ug via ORAL
  Filled 2019-03-09 (×3): qty 1

## 2019-03-09 MED ORDER — SUGAMMADEX SODIUM 200 MG/2ML IV SOLN
INTRAVENOUS | Status: DC | PRN
  Administered 2019-03-09: 200 mg via INTRAVENOUS

## 2019-03-09 MED ORDER — SODIUM CHLORIDE 0.9 % IV SOLN
INTRAVENOUS | Status: DC
  Administered 2019-03-09: 05:00:00 via INTRAVENOUS

## 2019-03-09 MED ORDER — ENOXAPARIN SODIUM 30 MG/0.3ML ~~LOC~~ SOLN
30.0000 mg | SUBCUTANEOUS | Status: DC
  Administered 2019-03-10: 30 mg via SUBCUTANEOUS
  Filled 2019-03-09: qty 0.3

## 2019-03-09 MED ORDER — ACETAMINOPHEN 10 MG/ML IV SOLN
1000.0000 mg | Freq: Four times a day (QID) | INTRAVENOUS | Status: AC
  Administered 2019-03-09 – 2019-03-10 (×4): 1000 mg via INTRAVENOUS
  Filled 2019-03-09 (×5): qty 100

## 2019-03-09 MED ORDER — MIDAZOLAM HCL 2 MG/2ML IJ SOLN
INTRAMUSCULAR | Status: AC
  Filled 2019-03-09: qty 2

## 2019-03-09 MED ORDER — BUPIVACAINE-EPINEPHRINE (PF) 0.5% -1:200000 IJ SOLN
INTRAMUSCULAR | Status: DC | PRN
  Administered 2019-03-09 (×2): 30 mL via PERINEURAL

## 2019-03-09 MED ORDER — SUCCINYLCHOLINE CHLORIDE 20 MG/ML IJ SOLN
INTRAMUSCULAR | Status: DC | PRN
  Administered 2019-03-09: 100 mg via INTRAVENOUS

## 2019-03-09 MED ORDER — ETOMIDATE 2 MG/ML IV SOLN
INTRAVENOUS | Status: DC | PRN
  Administered 2019-03-09: 14 mg via INTRAVENOUS

## 2019-03-09 MED ORDER — ETOMIDATE 2 MG/ML IV SOLN
INTRAVENOUS | Status: AC
  Filled 2019-03-09: qty 10

## 2019-03-09 MED ORDER — METOCLOPRAMIDE HCL 5 MG/ML IJ SOLN
5.0000 mg | Freq: Three times a day (TID) | INTRAMUSCULAR | Status: DC | PRN
  Administered 2019-03-09: 10 mg via INTRAVENOUS
  Filled 2019-03-09: qty 2

## 2019-03-09 MED ORDER — PHENOL 1.4 % MT LIQD: 1.0000 | OROMUCOSAL | Status: DC | PRN

## 2019-03-09 MED ORDER — LACTATED RINGERS IV SOLN
INTRAVENOUS | Status: DC | PRN
  Administered 2019-03-09: 13:00:00 via INTRAVENOUS

## 2019-03-09 MED ORDER — HYDROMORPHONE HCL 1 MG/ML IJ SOLN
0.5000 mg | INTRAMUSCULAR | Status: DC | PRN
  Administered 2019-03-09 – 2019-03-10 (×3): 0.5 mg via INTRAVENOUS
  Filled 2019-03-09 (×3): qty 0.5

## 2019-03-09 MED ORDER — ONDANSETRON HCL 4 MG PO TABS: 4.0000 mg | ORAL_TABLET | Freq: Four times a day (QID) | ORAL | Status: DC | PRN

## 2019-03-09 MED ORDER — SODIUM CHLORIDE 0.9% IV SOLUTION
Freq: Once | INTRAVENOUS | Status: AC
  Administered 2019-03-10: 13:00:00 via INTRAVENOUS

## 2019-03-09 MED ORDER — LACTATED RINGERS IV SOLN
INTRAVENOUS | Status: DC | PRN
  Administered 2019-03-09: 12:00:00 via INTRAVENOUS

## 2019-03-09 MED ORDER — PHENYLEPHRINE HCL (PRESSORS) 10 MG/ML IV SOLN
INTRAVENOUS | Status: DC | PRN
  Administered 2019-03-09: 40 ug via INTRAVENOUS
  Administered 2019-03-09: 80 ug via INTRAVENOUS

## 2019-03-09 MED ORDER — MENTHOL 3 MG MT LOZG
1.0000 | LOZENGE | OROMUCOSAL | Status: DC | PRN
  Filled 2019-03-09: qty 9

## 2019-03-09 MED ORDER — SUGAMMADEX SODIUM 500 MG/5ML IV SOLN
INTRAVENOUS | Status: AC
  Filled 2019-03-09: qty 5

## 2019-03-09 MED ORDER — SODIUM CHLORIDE 0.9 % IR SOLN
Status: DC | PRN
  Administered 2019-03-09: 1000 mL

## 2019-03-09 MED ORDER — POLYETHYLENE GLYCOL 3350 17 G PO PACK
17.0000 g | PACK | Freq: Every day | ORAL | Status: DC
  Administered 2019-03-10 – 2019-03-11 (×2): 17 g via ORAL
  Filled 2019-03-09 (×2): qty 1

## 2019-03-09 MED ORDER — VANCOMYCIN HCL IN DEXTROSE 1-5 GM/200ML-% IV SOLN
1000.0000 mg | INTRAVENOUS | Status: AC
  Administered 2019-03-10: 14:00:00 1000 mg via INTRAVENOUS
  Filled 2019-03-09 (×2): qty 200

## 2019-03-09 MED ORDER — ACETAMINOPHEN 650 MG RE SUPP: 650.0000 mg | Freq: Four times a day (QID) | RECTAL | Status: DC | PRN

## 2019-03-09 MED ORDER — ONDANSETRON HCL 4 MG/2ML IJ SOLN
4.0000 mg | Freq: Four times a day (QID) | INTRAMUSCULAR | Status: DC | PRN
  Administered 2019-03-09 – 2019-03-10 (×3): 4 mg via INTRAVENOUS
  Filled 2019-03-09 (×2): qty 2

## 2019-03-09 MED ORDER — ACETAMINOPHEN 325 MG PO TABS: 650.0000 mg | ORAL_TABLET | Freq: Four times a day (QID) | ORAL | Status: DC | PRN

## 2019-03-09 MED ORDER — ROCURONIUM BROMIDE 100 MG/10ML IV SOLN
INTRAVENOUS | Status: DC | PRN
  Administered 2019-03-09: 20 mg via INTRAVENOUS

## 2019-03-09 MED ORDER — DOCUSATE SODIUM 100 MG PO CAPS
100.0000 mg | ORAL_CAPSULE | Freq: Two times a day (BID) | ORAL | Status: DC
  Administered 2019-03-09 – 2019-03-11 (×4): 100 mg via ORAL
  Filled 2019-03-09 (×4): qty 1

## 2019-03-09 MED ORDER — METOCLOPRAMIDE HCL 10 MG PO TABS: 5.0000 mg | ORAL_TABLET | Freq: Three times a day (TID) | ORAL | Status: DC | PRN

## 2019-03-09 MED ORDER — FENTANYL CITRATE (PF) 100 MCG/2ML IJ SOLN
INTRAMUSCULAR | Status: AC
  Filled 2019-03-09: qty 2

## 2019-03-09 MED ORDER — CARVEDILOL 3.125 MG PO TABS
3.1250 mg | ORAL_TABLET | Freq: Two times a day (BID) | ORAL | Status: DC
  Administered 2019-03-09 – 2019-03-11 (×4): 3.125 mg via ORAL
  Filled 2019-03-09 (×4): qty 1

## 2019-03-09 MED ORDER — FENTANYL CITRATE (PF) 100 MCG/2ML IJ SOLN
INTRAMUSCULAR | Status: DC | PRN
  Administered 2019-03-09: 50 ug via INTRAVENOUS

## 2019-03-09 SURGICAL SUPPLY — 58 items
APL PRP STRL LF DISP 70% ISPRP (MISCELLANEOUS) ×1
BIT DRILL AO GAMMA 4.2X300 (BIT) ×3 IMPLANT
BLADE HEX COATED 2.75 (ELECTRODE) ×3 IMPLANT
BLADE SURG SZ10 CARB STEEL (BLADE) ×6 IMPLANT
BNDG GAUZE ELAST 4 BULKY (GAUZE/BANDAGES/DRESSINGS) ×3 IMPLANT
CHLORAPREP W/TINT 26 (MISCELLANEOUS) ×3 IMPLANT
CLOTH BEACON ORANGE TIMEOUT ST (SAFETY) ×3 IMPLANT
COVER LIGHT HANDLE STERIS (MISCELLANEOUS) ×6 IMPLANT
COVER MAYO STAND XLG (DRAPE) ×3 IMPLANT
COVER WAND RF STERILE (DRAPES) ×3 IMPLANT
DECANTER SPIKE VIAL GLASS SM (MISCELLANEOUS) ×6 IMPLANT
DRAPE STERI IOBAN 125X83 (DRAPES) ×3 IMPLANT
DRSG MEPILEX BORDER 4X12 (GAUZE/BANDAGES/DRESSINGS) ×3 IMPLANT
DRSG PAD ABDOMINAL 8X10 ST (GAUZE/BANDAGES/DRESSINGS) ×3 IMPLANT
ELECT REM PT RETURN 9FT ADLT (ELECTROSURGICAL) ×3
ELECTRODE REM PT RTRN 9FT ADLT (ELECTROSURGICAL) ×1 IMPLANT
GLOVE BIOGEL PI IND STRL 7.0 (GLOVE) ×2 IMPLANT
GLOVE BIOGEL PI IND STRL 7.5 (GLOVE) IMPLANT
GLOVE BIOGEL PI INDICATOR 7.0 (GLOVE) ×6
GLOVE BIOGEL PI INDICATOR 7.5 (GLOVE) ×2
GLOVE SKINSENSE NS SZ8.0 LF (GLOVE) ×2
GLOVE SKINSENSE STRL SZ8.0 LF (GLOVE) ×1 IMPLANT
GLOVE SS N UNI LF 8.5 STRL (GLOVE) ×3 IMPLANT
GOWN STRL REUS W/ TWL LRG LVL3 (GOWN DISPOSABLE) ×1 IMPLANT
GOWN STRL REUS W/TWL LRG LVL3 (GOWN DISPOSABLE) ×6 IMPLANT
GOWN STRL REUS W/TWL XL LVL3 (GOWN DISPOSABLE) ×3 IMPLANT
GUIDEROD T2 3X1000 (ROD) ×3 IMPLANT
INST SET MAJOR BONE (KITS) ×3 IMPLANT
K-WIRE  3.2X450M STR (WIRE) ×2
K-WIRE 3.2X450M STR (WIRE) ×1
KIT BLADEGUARD II DBL (SET/KITS/TRAYS/PACK) ×3 IMPLANT
KIT TURNOVER CYSTO (KITS) ×3 IMPLANT
KWIRE 3.2X450M STR (WIRE) ×1 IMPLANT
MANIFOLD NEPTUNE II (INSTRUMENTS) ×3 IMPLANT
MARKER SKIN DUAL TIP RULER LAB (MISCELLANEOUS) ×3 IMPLANT
NAIL TROCH GAMMA 3 KT (Nail) ×2 IMPLANT
NDL HYPO 21X1.5 SAFETY (NEEDLE) ×1 IMPLANT
NDL SPNL 18GX3.5 QUINCKE PK (NEEDLE) ×1 IMPLANT
NEEDLE HYPO 21X1.5 SAFETY (NEEDLE) ×3 IMPLANT
NEEDLE SPNL 18GX3.5 QUINCKE PK (NEEDLE) ×3 IMPLANT
NS IRRIG 1000ML POUR BTL (IV SOLUTION) ×3 IMPLANT
PACK BASIC III (CUSTOM PROCEDURE TRAY) ×3
PACK SRG BSC III STRL LF ECLPS (CUSTOM PROCEDURE TRAY) ×1 IMPLANT
PENCIL HANDSWITCHING (ELECTRODE) ×3 IMPLANT
SCREW LAG GAMMA 3 110MM (Screw) ×2 IMPLANT
SCREW LOCKING T2 F/T  5MMX40MM (Screw) ×2 IMPLANT
SCREW LOCKING T2 F/T 5MMX40MM (Screw) IMPLANT
SET BASIN LINEN APH (SET/KITS/TRAYS/PACK) ×3 IMPLANT
SLING ARM FOAM STRAP XLG (SOFTGOODS) ×3 IMPLANT
SPONGE LAP 18X18 RF (DISPOSABLE) ×6 IMPLANT
STAPLER VISISTAT 35W (STAPLE) ×2 IMPLANT
SUT BRALON NAB BRD #1 30IN (SUTURE) ×3 IMPLANT
SUT MNCRL 0 VIOLET CTX 36 (SUTURE) ×1 IMPLANT
SUT MON AB 2-0 CT1 36 (SUTURE) ×3 IMPLANT
SUT MONOCRYL 0 CTX 36 (SUTURE) ×2
SYR 30ML LL (SYRINGE) ×3 IMPLANT
SYR BULB IRRIGATION 50ML (SYRINGE) ×6 IMPLANT
YANKAUER SUCT BULB TIP NO VENT (SUCTIONS) ×3 IMPLANT

## 2019-03-09 NOTE — Progress Notes (Signed)
Patient assessed post-op ORIF. Easily aroused but falls asleep quickly. Sterile dressing intact on right hip, ice to area. Patient  c/o pain, readjusted and gave pain medication. Refused dinner. Swallowed a small of water with some coughing. Additional water no issues. Patient stated nausea and belching and gagging heard. No emesis at this time. Meds provided IVP. Oral medication held. Dr notified.

## 2019-03-09 NOTE — ED Provider Notes (Signed)
Wrong chart for different patient was started. No visit associated with this chart.    Yara Tomkinson, Corene Cornea, MD 03/09/19 [phone]

## 2019-03-09 NOTE — Anesthesia Preprocedure Evaluation (Signed)
Anesthesia Evaluation    Airway        Dental   Pulmonary           Cardiovascular hypertension, + pacemaker      Neuro/Psych    GI/Hepatic GERD  ,  Endo/Other  Hypothyroidism   Renal/GU      Musculoskeletal   Abdominal   Peds  Hematology   Anesthesia Other Findings Pacemaker replacement approx 6 months ago Fall while putting on pajamas  Right hand, fingers, elbiow trauma Denies blood thinners Denies any pulm, neuro, endo hx  Reproductive/Obstetrics                             Anesthesia Physical Anesthesia Plan  ASA: IV  Anesthesia Plan: General   Post-op Pain Management:    Induction:   PONV Risk Score and Plan:   Airway Management Planned:   Additional Equipment:   Intra-op Plan:   Post-operative Plan:   Informed Consent: I have reviewed the patients History and Physical, chart, labs and discussed the procedure including the risks, benefits and alternatives for the proposed anesthesia with the patient or authorized representative who has indicated his/her understanding and acceptance.       Plan Discussed with: Anesthesiologist  Anesthesia Plan Comments:         Anesthesia Quick Evaluation

## 2019-03-09 NOTE — Brief Op Note (Signed)
03/09/2019  1:24 PM  PATIENT:  Phillip Taylor  85 y.o. male  PRE-OPERATIVE DIAGNOSIS:  right hip fracture  POST-OPERATIVE DIAGNOSIS:  right hip fracture  PROCEDURE:  Procedure(s): INTRAMEDULLARY (IM) NAIL INTERTROCHANTRIC (Right)   Stryker gamma nail 130 short// 110 lag  40 distal locking screw  And acorn in sliding mode   SURGEON:  Surgeon(s) and Role:    Aline Brochure, Tim Lair, MD - Primary  Mr. Beever was brought to surgery secondary to a displaced right intertrochanteric fracture of his hip  He was seen in preop area his chart review was completed and his surgical site was confirmed his right hip and was marked as such.  He was taken to the operating room and general anesthesia was administered there were no complications.  He was given a gram of vancomycin secondary to a penicillin allergy which causes a rash.  He was then placed on the fracture table and his right leg was placed in traction left leg was placed in a well leg holder and the C-arm was brought in.  The leg was manipulated into internal rotation to match the proximal fragment and C arm x-rays at 0 and 20 degrees lateral confirm the reduction.  His right leg was then prepped and draped sterilely starting from the hip area down to the knee.  After sterile prep and drape timeout was completed. Right hip confirmed as operative site procedure confirmed and patient confirmed.  I checked the implants prior to entering the OR.  The incision was made at the greater trochanter extended approximately 4 cm and then the subcutaneous tissue was divided including the fascia blunt dissection was carried down to the tip of the trochanter with finger dissection and the awl was passed under C-arm guidance.  Once the awl was in good position a guidewire was placed down to the knee and confirmed by x-ray.  The guidewire was used to pass a reamer down to the lesser trochanter followed by insertion of the 130 degree nail.  A second incision was made  distal to the first incision.  Fascia was divided and blunt dissection was carried down to bone with a cannula for the perforating drill was passed.  This was confirmed by C arm.  After breaching the lateral cortex a threaded tip guidewire was placed in the center of the femoral head using the C arm in multiple planes to confirm position with a appropriate tip to apex distance.  This was measured and then overreamed with 110 mm reaming setting.  Screw was passed over the guidewire and confirmed to be in good position by radiographs.  The proximal portion of the nail was irrigated and a corkscrew was placed it was confirmed to be engaged with the lag screw by toggling the screwdriver handle.  Then it was turned in reverse one quarter turn and confirmation of continuous engagement by toggling the screw handle was performed  We then made a third incision for the locking screw we used a pointed tipped cannula system to get down to bone and then drilled across the bone under C-arm guidance.  We placed a 40 mm lag screw and confirmed position by x-ray  We irrigated all 3 wounds and then closed the proximal wound with #1 Braylon.  0 Monocryl and then a subcuticular 2-0 Monocryl and Steri-Strips with benzoin to help them stick.  We closed the distal wounds with 0 Monocryl and 2-0 Monocryl for the first distal wound and then just 2-0 Monocryl for  the last distal wound again using Steri-Strips and benzoin to reapproximate the skin edges  Sterile dressing was applied  The patient was extubated and taken to recovery room in stable condition  Postoperative plan Weightbearing status as tolerated DVT prophylaxis for 35 days with Lovenox There are no sutures to take out the first postop appointment can be made 4 weeks after discharge unless there are any issues No hip precautions are needed  PHYSICIAN ASSISTANT:   ASSISTANTS: none   ANESTHESIA:   General  EBL:  50  BLOOD ADMINISTERED:none  DRAINS: none    LOCAL MEDICATIONS USED:  MARCAINE   With epi 60 cc  SPECIMEN:  No Specimen  DISPOSITION OF SPECIMEN:  N/A  COUNTS:  YES  DICTATION: .Dragon Dictation  PLAN OF CARE: admit   PATIENT DISPOSITION:  PACU - hemodynamically stable.   Delay start of Pharmacological VTE agent (>24hrs) due to surgical blood loss or risk of bleeding: not applicable

## 2019-03-09 NOTE — Consult Note (Signed)
Reason for Consult: DR Heath Lark  Referring Physician: RT HIP FRACTURE  Phillip Taylor is an 85 y.o. male.  HPI: 85 year old male reasonably healthy ambulates independently daily fell while putting on his pajamas injured his right hip  Pain location right hip Quality dull ache sharp with movement Date of injury March 08, 2019 Associated signs and symptoms patient unable to ambulate deformity of the right lower extremity abduction external rotation and shortening   Past Medical History:  Diagnosis Date  . BPH (benign prostatic hyperplasia)   . Carotid sinus hypersensitivity   . Carotid stenosis, asymptomatic, bilateral   . GERD (gastroesophageal reflux disease)   . Hypertension   . Hypothyroidism   . Osteoporosis   . PONV (postoperative nausea and vomiting)   . Vocal cord cancer (HCC)    at least 13 years ago    Past Surgical History:  Procedure Laterality Date  . COLONOSCOPY  10/25/2012   Procedure: COLONOSCOPY;  Surgeon: Daneil Dolin, MD;  Location: [facility];  Service: Endoscopy;  Laterality: N/A;  1:30  . HERNIA REPAIR    . NISSEN FUNDOPLICATION    . PACEMAKER INSERTION    . TRANSURETHRAL RESECTION OF PROSTATE    . vocal cord cancer removal      Family History  Problem Relation Age of Onset  . Colon cancer Neg Hx     Social History:  reports that he has never smoked. He has never used smokeless tobacco. He reports that he does not drink alcohol or use drugs.  Allergies:  Allergies  Allergen Reactions  . Prevacid [Lansoprazole] Other (See Comments)    Does not remeber  . Prilosec [Omeprazole] Other (See Comments)    Does not remeber  . Codeine Other (See Comments)    Does not know   . Penicillins Rash   Meds ordered this encounter  Medications  . fentaNYL (SUBLIMAZE) injection 50 mcg  . 0.9 %  sodium chloride infusion  . OR Linked Order Group   . acetaminophen (TYLENOL) tablet 650 mg   . acetaminophen (TYLENOL) suppository 650 mg  . HYDROmorphone  (DILAUDID) injection 0.5 mg  . ondansetron (ZOFRAN) injection 4 mg  . diphenhydrAMINE (BENADRYL) injection 25 mg  . levothyroxine (SYNTHROID) tablet 75 mcg  . carvedilol (COREG) tablet 3.125 mg  . 0.9 %  sodium chloride infusion (Manually program via Guardrails IV Fluids)     Results for orders placed or performed during the hospital encounter of 03/08/19 (from the past 48 hour(s))  Basic metabolic panel     Status: Abnormal   Collection Time: 03/08/19 11:58 PM  Result Value Ref Range   Sodium 139 135 - 145 mmol/L   Potassium 3.5 3.5 - 5.1 mmol/L   Chloride 105 98 - 111 mmol/L   CO2 23 22 - 32 mmol/L   Glucose, Bld 188 (H) 70 - 99 mg/dL   BUN 28 (H) 8 - 23 mg/dL   Creatinine, Ser 1.46 (H) 0.61 - 1.24 mg/dL   Calcium 8.7 (L) 8.9 - 10.3 mg/dL   GFR calc non Af Amer 41 (L) >60 mL/min   GFR calc Af Amer 48 (L) >60 mL/min   Anion gap 11 5 - 15    Comment: Performed at [facility], [address], [place], [place] [address]  CBC WITH DIFFERENTIAL     Status: Abnormal   Collection Time: 03/08/19 11:58 PM  Result Value Ref Range   WBC 12.6 (H) 4.0 - 10.5 K/uL   RBC  4.45 4.22 - 5.81 MIL/uL   Hemoglobin 14.6 13.0 - 17.0 g/dL   HCT 44.8 39.0 - 52.0 %   MCV 100.7 (H) 80.0 - 100.0 fL   MCH 32.8 26.0 - 34.0 pg   MCHC 32.6 30.0 - 36.0 g/dL   RDW 14.0 11.5 - 15.5 %   Platelets 173 150 - 400 K/uL   nRBC 0.0 0.0 - 0.2 %   Neutrophils Relative % 86 %   Neutro Abs 10.8 (H) 1.7 - 7.7 K/uL   Lymphocytes Relative 7 %   Lymphs Abs 0.9 0.7 - 4.0 K/uL   Monocytes Relative 7 %   Monocytes Absolute 0.9 0.1 - 1.0 K/uL   Eosinophils Relative 0 %   Eosinophils Absolute 0.0 0.0 - 0.5 K/uL   Basophils Relative 0 %   Basophils Absolute 0.0 0.0 - 0.1 K/uL   Immature Granulocytes 0 %   Abs Immature Granulocytes 0.05 0.00 - 0.07 K/uL    Comment: Performed at [facility], [address] [place], [place] [address]  Protime-INR     Status: None   Collection Time: 03/08/19 11:58 PM  Result Value Ref  Range   Prothrombin Time 14.1 11.4 - 15.2 seconds   INR 1.1 0.8 - 1.2    Comment: (NOTE) INR goal varies based on device and disease states. Performed at [facility], [address] [place], [place] [address]   Type and screen [facility]     Status: None   Collection Time: 03/09/19 12:14 AM  Result Value Ref Range   ABO/RH(D) A POS    Antibody Screen NEG    Sample Expiration      03/12/2019,2359 Performed at [facility], [address] [place], [place] [address]   SARS Coronavirus 2 (CEPHEID - Performed in [facility] hospital lab), Hosp Order     Status: None   Collection Time: 03/09/19  1:26 AM  Result Value Ref Range   SARS Coronavirus 2 NEGATIVE NEGATIVE    Comment: (NOTE) If result is NEGATIVE SARS-CoV-2 target nucleic acids are NOT DETECTED. The SARS-CoV-2 RNA is generally detectable in upper and lower  respiratory specimens during the acute phase of infection. The lowest  concentration of SARS-CoV-2 viral copies this assay can detect is 250  copies / mL. A negative result does not preclude SARS-CoV-2 infection  and should not be used as the sole basis for treatment or other  patient management decisions.  A negative result may occur with  improper specimen collection / handling, submission of specimen other  than nasopharyngeal swab, presence of viral mutation(s) within the  areas targeted by this assay, and inadequate number of viral copies  (<250 copies / mL). A negative result must be combined with clinical  observations, patient history, and epidemiological information. If result is POSITIVE SARS-CoV-2 target nucleic acids are DETECTED. The SARS-CoV-2 RNA is generally detectable in upper and lower  respiratory specimens dur ing the acute phase of infection.  Positive  results are indicative of active infection with SARS-CoV-2.  Clinical  correlation with patient history and other diagnostic information is  necessary to determine patient infection status.   Positive results do  not rule out bacterial infection or co-infection with other viruses. If result is PRESUMPTIVE POSTIVE SARS-CoV-2 nucleic acids MAY BE PRESENT.   A presumptive positive result was obtained on the submitted specimen  and confirmed on repeat testing.  While 2019 novel coronavirus  (SARS-CoV-2) nucleic acids may be present in the submitted sample  additional confirmatory testing may be necessary for epidemiological  and / or clinical management purposes  to differentiate between  SARS-CoV-2 and other Sarbecovirus currently known to infect humans.  If clinically indicated additional testing with an alternate test  methodology [phone]) is advised. The SARS-CoV-2 RNA is generally  detectable in upper and lower respiratory sp ecimens during the acute  phase of infection. The expected result is Negative. Fact Sheet for Patients:  StrictlyIdeas.no Fact Sheet for Healthcare Providers: BankingDealers.co.za This test is not yet approved or cleared by the [place] FDA and has been authorized for detection and/or diagnosis of SARS-CoV-2 by FDA under an Emergency Use Authorization (EUA).  This EUA will remain in effect (meaning this test can be used) for the duration of the COVID-19 declaration under Section 564(b)(1) of the Act, 21 U.S.C. section 360bbb-3(b)(1), unless the authorization is terminated or revoked sooner. Performed at [facility], [address] [place], [place] [address]   Troponin I - Add-On to previous collection     Status: None   Collection Time: 03/09/19  3:23 AM  Result Value Ref Range   Troponin I <0.03 <0.03 ng/mL    Comment: Performed at [facility], [address] [place], [place] [address]  Comprehensive metabolic panel     Status: Abnormal   Collection Time: 03/09/19  6:29 AM  Result Value Ref Range   Sodium 141 135 - 145 mmol/L   Potassium 3.7 3.5 - 5.1 mmol/L   Chloride 105 98 - 111 mmol/L    CO2 23 22 - 32 mmol/L   Glucose, Bld 168 (H) 70 - 99 mg/dL   BUN 27 (H) 8 - 23 mg/dL   Creatinine, Ser 1.21 0.61 - 1.24 mg/dL   Calcium 8.6 (L) 8.9 - 10.3 mg/dL   Total Protein 6.8 6.5 - 8.1 g/dL   Albumin 3.7 3.5 - 5.0 g/dL   AST 35 15 - 41 U/L   ALT 24 0 - 44 U/L   Alkaline Phosphatase 82 38 - 126 U/L   Total Bilirubin 1.1 0.3 - 1.2 mg/dL   GFR calc non Af Amer 52 (L) >60 mL/min   GFR calc Af Amer 60 (L) >60 mL/min   Anion gap 13 5 - 15    Comment: Performed at [facility], [address] [place], [place] [address]  CBC     Status: Abnormal   Collection Time: 03/09/19  6:29 AM  Result Value Ref Range   WBC 10.5 4.0 - 10.5 K/uL   RBC 3.90 (L) 4.22 - 5.81 MIL/uL   Hemoglobin 13.2 13.0 - 17.0 g/dL   HCT 38.9 (L) 39.0 - 52.0 %   MCV 99.7 80.0 - 100.0 fL   MCH 33.8 26.0 - 34.0 pg   MCHC 33.9 30.0 - 36.0 g/dL   RDW 14.1 11.5 - 15.5 %   Platelets 157 150 - 400 K/uL   nRBC 0.0 0.0 - 0.2 %    Comment: Performed at [facility], [address], [place], [place] [address]    Dg Chest 1 View  Result Date: 03/09/2019 CLINICAL DATA:  Fall EXAM: CHEST  1 VIEW COMPARISON:  None. FINDINGS: The heart size and mediastinal contours are within normal limits. Both lungs are clear. The visualized skeletal structures are unremarkable. Left chest wall pacemaker. IMPRESSION: No active disease. Electronically Signed   By: Ulyses Jarred M.D.   On: 03/09/2019 01:54   Dg Hip Unilat With Pelvis 2-3 Views Right  Result Date: 03/09/2019 CLINICAL DATA:  85 year old male with fall and right hip fracture. EXAM: DG HIP (WITH OR WITHOUT PELVIS) 2-3V RIGHT COMPARISON:  None. FINDINGS: There is a comminuted intertrochanteric fracture of the right femoral neck with mild valgus angulation. There is no dislocation. The bones are osteopenic. A hernia repair mesh is noted. The soft tissues are unremarkable. IMPRESSION: Comminuted intertrochanteric fracture of the right femoral neck. Electronically Signed   By: Anner Crete M.D.   On: 03/09/2019 01:55    Review of Systems  Unable to perform ROS: Acuity of condition   Blood pressure (!) 171/94, pulse 88, temperature 97.9 F (36.6 C), temperature source Oral, resp. rate 17, height 5\' 9"  (1.753 m), weight 68.3 kg, SpO2 97 %. Physical Exam Normal development grooming and hygiene Alert oriented to person place but not time, becomes confused at times Mood and affect are pleasant  Right and left upper extremity normal alignment, no joint contractures, joint subluxations none, muscle tone normal neurovascular exam intact skin clean but thin in various areas of ecchymosis on laceration right lateral forearm minor mild Left lower extremity Normal alignment, no joint contractures, joint subluxations none, muscle tone normal neurovascular exam intact skin clean nodes normal  Right lower extremity abnormal alignment abduction external rotation shortening, joint subluxations none muscle tone normal sensation normal pulse and perfusion normal skin normal lymph nodes negative in the groin  Assessment/Plan: X-rays show a right intertrochanteric fracture of the hip with a proximal fragment flexed  I spoke to the patient's son who I know well and he agrees to proceed with surgery after conversation regarding risk and benefits of surgical versus nonsurgical treatment.  He understands at the patient could have difficulty postoperatively based on his age but we expect reasonable recovery based on his activity level and overall health condition at this time.  His confusion may inhibit his postoperative course.  The blood bank has A positive and O- blood,  his hemoglobin is 13.2 we do not anticipate needing any blood.  We can proceed to the operating room without type and cross although 1 was ordered if needed later  Surgeon procedure planned open treatment internal fixation right hip with gamma nail  Arther Abbott 03/09/2019, 10:24 AM

## 2019-03-09 NOTE — H&P (Addendum)
TRH H&P    Patient Demographics:    Phillip Taylor, is a 85 y.o. male  MRN: [id number]  DOB - [birth year]  Admit Date - 03/08/2019  Referring MD/NP/PA:  Merrily Pew  Outpatient Primary MD for the patient is [name], MD [facility]  Patient coming from:  home  Chief complaint-  Fall with right hip pain   HPI:    Phillip Taylor  is a 85 y.o. 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  Pt was putting on his pajamas near the bed when he fell according to his son.  No syncope. Pt denies cp, palp, sob, n/v, abd pain, diarrhea, brbpr, dysuria.  Pt's son says that the patient has some underlying dementia.    In ED T 98.1, P 91 R 17, Bp 169/83  Pox 98% on RA Wt 63.5kg  R hip xray IMPRESSION: Comminuted intertrochanteric fracture of the right femoral neck.  CXR  IMPRESSION: No active disease.  covid -19 negative  INR 1.0 Wbc 12.6, Hgb 14.6, Plt 173  Bun 28, Creatinine 1.46 Calcium 8.7 Glucose 188  Ekg nsr at 90, nl axis, 1st degree avb, q in v1-3, no st-t changes c/w ischemia,  No prior for comparison  ED discussed case with Dr. Arther Abbott  Pt will be admitted for fall with right intertrochanteric fracture         Review of systems:    In addition to the HPI above,  Pt is a poor historian, per son has underlying dementia  No Fever-chills, No Headache, No changes with Vision or hearing, No problems swallowing food or Liquids, No Chest pain, Cough or Shortness of Breath, No Abdominal pain, No Nausea or Vomiting, bowel movements are regular, No Blood in stool or Urine, No dysuria,  No new skin rashes or bruises, No new joints pains-aches,  No new weakness, tingling, numbness in any extremity, No recent weight gain or loss, No polyuria, polydypsia or polyphagia, No significant Mental Stressors.  All other systems reviewed and are negative.    Past History of the following :    Past Medical History:  Diagnosis Date  . GERD (gastroesophageal reflux disease)   . Hypertension   . Hypothyroidism   . Osteoporosis   . PONV (postoperative nausea and vomiting)   . Vocal cord cancer (HCC)    at least 13 years ago      Past Surgical History:  Procedure Laterality Date  . COLONOSCOPY  10/25/2012   Procedure: COLONOSCOPY;  Surgeon: Daneil Dolin, MD;  Location: [facility];  Service: Endoscopy;  Laterality: N/A;  1:30  . HERNIA REPAIR    . NISSEN FUNDOPLICATION    . PACEMAKER INSERTION    . TRANSURETHRAL RESECTION OF PROSTATE    . vocal cord cancer removal  Social History:      Social History   Tobacco Use  . Smoking status: Never Smoker  . Smokeless tobacco: Never Used  Substance Use Topics  . Alcohol use: No       Family History :     Family History  Problem Relation Age of Onset  . Colon cancer Neg Hx        Home Medications:   Prior to Admission medications   Medication Sig Start Date End Date Taking? Authorizing Provider  alendronate (FOSAMAX) 70 MG tablet Take 70 mg by mouth every 7 (seven) days.  10/07/12   [provider]  omeprazole (PRILOSEC) 20 MG capsule Take 20 mg by mouth daily.  10/09/12   [provider]  SYNTHROID 75 MCG tablet Take 75 mcg by mouth daily.  10/11/12   [provider]  triamterene-hydrochlorothiazide (MAXZIDE-25) 37.5-25 MG per tablet Take 1 tablet by mouth daily.  09/30/12   [provider]     Allergies:     Allergies  Allergen Reactions  . Prevacid [Lansoprazole] Other (See Comments)    Does not remeber  . Prilosec [Omeprazole] Other (See Comments)    Does  not remeber  . Codeine Other (See Comments)    Does not know   . Penicillins Rash     Physical Exam:   Vitals  Blood pressure (!) 169/83, pulse 91, temperature 98.1 F (36.7 C), temperature source Oral, resp. rate 17, height 5\' 9"  (1.753 m), weight 63.5 kg, SpO2 98 %.  1.  General: axox2 (person, place)  2. Psychiatric: euthymic  3. Neurologic: cn2-12 intact, reflexes 2+ symmetric, diffuse with no clonus, motor 5/5 in all 4 ext  4. HEENMT:  Anicteric, pupils 1.65mm symmetric, direct, consensual, near intact Neck: no jvd  5. Respiratory : CTAB  6. Cardiovascular : rrr s1, s2, no m/g/r  7. Gastrointestinal:  Abd: soft, nt, nd, +bs  8. Skin:  Ext: no c/c/e,  Linear scrap over the lateral aspect of the right forearm  9.Musculoskeletal:  Good ROM, pt has pain with even small movement of the right hip  No adenopathy    Data Review:    CBC Recent Labs  Lab 03/08/19 2358  WBC 12.6*  HGB 14.6  HCT 44.8  PLT 173  MCV 100.7*  MCH 32.8  MCHC 32.6  RDW 14.0  LYMPHSABS 0.9  MONOABS 0.9  EOSABS 0.0  BASOSABS 0.0   ------------------------------------------------------------------------------------------------------------------  Results for orders placed or performed during the hospital encounter of 03/08/19 (from the past 48 hour(s))  Basic metabolic panel     Status: Abnormal   Collection Time: 03/08/19 11:58 PM  Result Value Ref Range   Sodium 139 135 - 145 mmol/L   Potassium 3.5 3.5 - 5.1 mmol/L   Chloride 105 98 - 111 mmol/L   CO2 23 22 - 32 mmol/L   Glucose, Bld 188 (H) 70 - 99 mg/dL   BUN 28 (H) 8 - 23 mg/dL   Creatinine, Ser 1.46 (H) 0.61 - 1.24 mg/dL   Calcium 8.7 (L) 8.9 - 10.3 mg/dL   GFR calc non Af Amer 41 (L) >60 mL/min   GFR calc Af Amer 48 (L) >60 mL/min   Anion gap 11 5 - 15    Comment: Performed at [facility], [address], [place], [place] [address]  CBC WITH DIFFERENTIAL     Status: Abnormal   Collection Time: 03/08/19 11:58 PM   Result Value Ref Range   WBC 12.6 (  H) 4.0 - 10.5 K/uL   RBC 4.45 4.22 - 5.81 MIL/uL   Hemoglobin 14.6 13.0 - 17.0 g/dL   HCT 44.8 39.0 - 52.0 %   MCV 100.7 (H) 80.0 - 100.0 fL   MCH 32.8 26.0 - 34.0 pg   MCHC 32.6 30.0 - 36.0 g/dL   RDW 14.0 11.5 - 15.5 %   Platelets 173 150 - 400 K/uL   nRBC 0.0 0.0 - 0.2 %   Neutrophils Relative % 86 %   Neutro Abs 10.8 (H) 1.7 - 7.7 K/uL   Lymphocytes Relative 7 %   Lymphs Abs 0.9 0.7 - 4.0 K/uL   Monocytes Relative 7 %   Monocytes Absolute 0.9 0.1 - 1.0 K/uL   Eosinophils Relative 0 %   Eosinophils Absolute 0.0 0.0 - 0.5 K/uL   Basophils Relative 0 %   Basophils Absolute 0.0 0.0 - 0.1 K/uL   Immature Granulocytes 0 %   Abs Immature Granulocytes 0.05 0.00 - 0.07 K/uL    Comment: Performed at [facility], [address], [place], [place] [address]  Protime-INR     Status: None   Collection Time: 03/08/19 11:58 PM  Result Value Ref Range   Prothrombin Time 14.1 11.4 - 15.2 seconds   INR 1.1 0.8 - 1.2    Comment: (NOTE) INR goal varies based on device and disease states. Performed at [facility], [address], [place], [place] [address]   Type and screen [facility]     Status: None   Collection Time: 03/09/19 12:14 AM  Result Value Ref Range   ABO/RH(D) A POS    Antibody Screen NEG    Sample Expiration      03/12/2019,2359 Performed at [facility], [address], [place], [place] [address]   SARS Coronavirus 2 (CEPHEID - Performed in [facility] hospital lab), Hosp Order     Status: None   Collection Time: 03/09/19  1:26 AM  Result Value Ref Range   SARS Coronavirus 2 NEGATIVE NEGATIVE    Comment: (NOTE) If result is NEGATIVE SARS-CoV-2 target nucleic acids are NOT DETECTED. The SARS-CoV-2 RNA is generally detectable in upper and lower  respiratory specimens during the acute phase of infection. The lowest  concentration of SARS-CoV-2 viral copies this assay can detect is 250  copies / mL. A negative result does not  preclude SARS-CoV-2 infection  and should not be used as the sole basis for treatment or other  patient management decisions.  A negative result may occur with  improper specimen collection / handling, submission of specimen other  than nasopharyngeal swab, presence of viral mutation(s) within the  areas targeted by this assay, and inadequate number of viral copies  (<250 copies / mL). A negative result must be combined with clinical  observations, patient history, and epidemiological information. If result is POSITIVE SARS-CoV-2 target nucleic acids are DETECTED. The SARS-CoV-2 RNA is generally detectable in upper and lower  respiratory specimens dur ing the acute phase of infection.  Positive  results are indicative of active infection with SARS-CoV-2.  Clinical  correlation with patient history and other diagnostic information is  necessary to determine patient infection status.  Positive results do  not rule out bacterial infection or co-infection with other viruses. If result is PRESUMPTIVE POSTIVE SARS-CoV-2 nucleic acids MAY BE PRESENT.   A presumptive positive result was obtained on the submitted specimen  and confirmed on repeat testing.  While 2019 novel coronavirus  (SARS-CoV-2) nucleic acids may  be present in the submitted sample  additional confirmatory testing may be necessary for epidemiological  and / or clinical management purposes  to differentiate between  SARS-CoV-2 and other Sarbecovirus currently known to infect humans.  If clinically indicated additional testing with an alternate test  methodology [phone]) is advised. The SARS-CoV-2 RNA is generally  detectable in upper and lower respiratory sp ecimens during the acute  phase of infection. The expected result is Negative. Fact Sheet for Patients:  StrictlyIdeas.no Fact Sheet for Healthcare Providers: BankingDealers.co.za This test is not yet approved or cleared by  the [place] FDA and has been authorized for detection and/or diagnosis of SARS-CoV-2 by FDA under an Emergency Use Authorization (EUA).  This EUA will remain in effect (meaning this test can be used) for the duration of the COVID-19 declaration under Section 564(b)(1) of the Act, 21 U.S.C. section 360bbb-3(b)(1), unless the authorization is terminated or revoked sooner. Performed at [facility], [address] [place], [place] [address]     Chemistries  Recent Labs  Lab 03/08/19 2358  NA 139  K 3.5  CL 105  CO2 23  GLUCOSE 188*  BUN 28*  CREATININE 1.46*  CALCIUM 8.7*   ------------------------------------------------------------------------------------------------------------------  ------------------------------------------------------------------------------------------------------------------ GFR: Estimated Creatinine Clearance: 29 mL/min (A) (by C-G formula based on SCr of 1.46 mg/dL (H)). Liver Function Tests: No results for input(s): AST, ALT, ALKPHOS, BILITOT, PROT, ALBUMIN in the last 168 hours. No results for input(s): LIPASE, AMYLASE in the last 168 hours. No results for input(s): AMMONIA in the last 168 hours. Coagulation Profile: Recent Labs  Lab 03/08/19 2358  INR 1.1   Cardiac Enzymes: No results for input(s): CKTOTAL, CKMB, CKMBINDEX, TROPONINI in the last 168 hours. BNP (last 3 results) No results for input(s): PROBNP in the last 8760 hours. HbA1C: No results for input(s): HGBA1C in the last 72 hours. CBG: No results for input(s): GLUCAP in the last 168 hours. Lipid Profile: No results for input(s): CHOL, HDL, LDLCALC, TRIG, CHOLHDL, LDLDIRECT in the last 72 hours. Thyroid Function Tests: No results for input(s): TSH, T4TOTAL, FREET4, T3FREE, THYROIDAB in the last 72 hours. Anemia Panel: No results for input(s): VITAMINB12, FOLATE, FERRITIN, TIBC, IRON, RETICCTPCT in the last 72 hours.   --------------------------------------------------------------------------------------------------------------- Urine analysis: No results found for: COLORURINE, APPEARANCEUR, LABSPEC, PHURINE, GLUCOSEU, HGBUR, BILIRUBINUR, KETONESUR, PROTEINUR, UROBILINOGEN, NITRITE, LEUKOCYTESUR    Imaging Results:    Dg Chest 1 View  Result Date: 03/09/2019 CLINICAL DATA:  Fall EXAM: CHEST  1 VIEW COMPARISON:  None. FINDINGS: The heart size and mediastinal contours are within normal limits. Both lungs are clear. The visualized skeletal structures are unremarkable. Left chest wall pacemaker. IMPRESSION: No active disease. Electronically Signed   By: Ulyses Jarred M.D.   On: 03/09/2019 01:54   Dg Hip Unilat With Pelvis 2-3 Views Right  Result Date: 03/09/2019 CLINICAL DATA:  85 year old male with fall and right hip fracture. EXAM: DG HIP (WITH OR WITHOUT PELVIS) 2-3V RIGHT COMPARISON:  None. FINDINGS: There is a comminuted intertrochanteric fracture of the right femoral neck with mild valgus angulation. There is no dislocation. The bones are osteopenic. A hernia repair mesh is noted. The soft tissues are unremarkable. IMPRESSION: Comminuted intertrochanteric fracture of the right femoral neck. Electronically Signed   By: Anner Crete M.D.   On: 03/09/2019 01:55       Assessment & Plan:    Principal Problem:   Intertrochanteric fracture of right femur, closed, initial encounter [facility]) Active Problems:   Dehydration  Osteoporosis   Essential hypertension   Hypothyroidism  R intertrochanteric fracture Check trop  NPO except for medication Pain control with dilaudid 0.5mg  iv q4h prn  Zofran 4mg  iv q6h prn n/v Ns at 7mL per hour PT/OT consult Orthopedics consulted by ED, appreciate Dr. Althia Forts input Pt states Dr. Aline Brochure took care of his wife and he is very appreciative of the care.  Pt's son requests that Dr. Aline Brochure call him, number is in the computer to inform him of plan  Dehydration,  mild ARF Hold Dyazide Hydrate with ns iv If creatinine not improving consider renal ultrasound  Hypertension Start carvedilol 3.125mg  po bid  Hyperglycemia Check hga1c, if elevated, then start fsbs and ISS  Hypothyroidism Cont Levothyroxine   Osteoporosis Hold Fosamax for now   DVT Prophylaxis-   SCDs , since probably going to have surgery today.  Defer to orthopedics regarding DVT prophylaxis  AM Labs Ordered, also please review Full Orders  Family Communication: Admission, patients condition and plan of care including tests being ordered have been discussed with the patient and his son who indicate understanding and agree with the plan and Code Status.  Code Status:  FULL CODE,  Pt lives with son, updated son of the pt's status  Admission status: Inpatient: Based on patients clinical presentation and evaluation of above clinical data, I have made determination that patient meets Inpatient criteria at this time.  Pt will require surgery for hip fracture, as well as iv hydration for dehydration and mild ARF, and monitoring of change in bp medication.  Pt will require > 2nites stay for hip fracture surgery and recovery and titration of bp medication, and resolution of ARF  Time spent in minutes : 70   Jani Gravel M.D on 03/09/2019 at 2:37 AM

## 2019-03-09 NOTE — Op Note (Signed)
03/09/2019  1:24 PM  PATIENT:  Phillip Taylor  85 y.o. male  PRE-OPERATIVE DIAGNOSIS:  right hip fracture  POST-OPERATIVE DIAGNOSIS:  right hip fracture  PROCEDURE:  Procedure(s): INTRAMEDULLARY (IM) NAIL INTERTROCHANTRIC (Right)   Stryker gamma nail 130 short// 110 lag  40 distal locking screw  And acorn in sliding mode   SURGEON:  Surgeon(s) and Role:    Aline Brochure, Tim Lair, MD - Primary  Mr. Meisenheimer was brought to surgery secondary to a displaced right intertrochanteric fracture of his hip  He was seen in preop area his chart review was completed and his surgical site was confirmed his right hip and was marked as such.  He was taken to the operating room and general anesthesia was administered there were no complications.  He was given a gram of vancomycin secondary to a penicillin allergy which causes a rash.  He was then placed on the fracture table and his right leg was placed in traction left leg was placed in a well leg holder and the C-arm was brought in.  The leg was manipulated into internal rotation to match the proximal fragment and C arm x-rays at 0 and 20 degrees lateral confirm the reduction.  His right leg was then prepped and draped sterilely starting from the hip area down to the knee.  After sterile prep and drape timeout was completed. Right hip confirmed as operative site procedure confirmed and patient confirmed.  I checked the implants prior to entering the OR.  The incision was made at the greater trochanter extended approximately 4 cm and then the subcutaneous tissue was divided including the fascia blunt dissection was carried down to the tip of the trochanter with finger dissection and the awl was passed under C-arm guidance.  Once the awl was in good position a guidewire was placed down to the knee and confirmed by x-ray.  The guidewire was used to pass a reamer down to the lesser trochanter followed by insertion of the 130 degree nail.  A second incision was made  distal to the first incision.  Fascia was divided and blunt dissection was carried down to bone with a cannula for the perforating drill was passed.  This was confirmed by C arm.  After breaching the lateral cortex a threaded tip guidewire was placed in the center of the femoral head using the C arm in multiple planes to confirm position with a appropriate tip to apex distance.  This was measured and then overreamed with 110 mm reaming setting.  Screw was passed over the guidewire and confirmed to be in good position by radiographs.  The proximal portion of the nail was irrigated and a corkscrew was placed it was confirmed to be engaged with the lag screw by toggling the screwdriver handle.  Then it was turned in reverse one quarter turn and confirmation of continuous engagement by toggling the screw handle was performed  We then made a third incision for the locking screw we used a pointed tipped cannula system to get down to bone and then drilled across the bone under C-arm guidance.  We placed a 40 mm lag screw and confirmed position by x-ray  We irrigated all 3 wounds and then closed the proximal wound with #1 Braylon.  0 Monocryl and then a subcuticular 2-0 Monocryl and Steri-Strips with benzoin to help them stick.  We closed the distal wounds with 0 Monocryl and 2-0 Monocryl for the first distal wound and then just 2-0 Monocryl for  the last distal wound again using Steri-Strips and benzoin to reapproximate the skin edges  Sterile dressing was applied  The patient was extubated and taken to recovery room in stable condition  Postoperative plan Weightbearing status as tolerated DVT prophylaxis for 35 days with Lovenox There are no sutures to take out the first postop appointment can be made 4 weeks after discharge unless there are any issues No hip precautions are needed  PHYSICIAN ASSISTANT:   ASSISTANTS: none   ANESTHESIA:   General  EBL:  50  BLOOD ADMINISTERED:none  DRAINS: none    LOCAL MEDICATIONS USED:  MARCAINE   With epi 60 cc  SPECIMEN:  No Specimen  DISPOSITION OF SPECIMEN:  N/A  COUNTS:  YES  DICTATION: .Dragon Dictation  PLAN OF CARE: admit   PATIENT DISPOSITION:  PACU - hemodynamically stable.   Delay start of Pharmacological VTE agent (>24hrs) due to surgical blood loss or risk of bleeding: not applicable

## 2019-03-09 NOTE — Progress Notes (Signed)
Per HPI:  Phillip Taylor  is a 85 y.o. 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  Pt was putting on his pajamas near the bed when he fell according to his son.  No syncope. Pt denies cp, palp, sob, n/v, abd pain, diarrhea, brbpr, dysuria.  Pt's son says that the patient has some underlying dementia.   Patient was admitted with right intertrochanteric fracture secondary to mechanical fall and also has some mild AKI secondary to dehydration and multiple medical issues.  Plans are for surgery this a.m.  Monitor repeat labs in a.m.

## 2019-03-09 NOTE — H&P (View-Only) (Signed)
Reason for Consult: DR Heath Lark  Referring Physician: RT HIP FRACTURE  Phillip Taylor is an 85 y.o. male.  HPI: 85 year old male reasonably healthy ambulates independently daily fell while putting on his pajamas injured his right hip  Pain location right hip Quality dull ache sharp with movement Date of injury March 08, 2019 Associated signs and symptoms patient unable to ambulate deformity of the right lower extremity abduction external rotation and shortening   Past Medical History:  Diagnosis Date  . BPH (benign prostatic hyperplasia)   . Carotid sinus hypersensitivity   . Carotid stenosis, asymptomatic, bilateral   . GERD (gastroesophageal reflux disease)   . Hypertension   . Hypothyroidism   . Osteoporosis   . PONV (postoperative nausea and vomiting)   . Vocal cord cancer (HCC)    at least 13 years ago    Past Surgical History:  Procedure Laterality Date  . COLONOSCOPY  10/25/2012   Procedure: COLONOSCOPY;  Surgeon: Daneil Dolin, MD;  Location: [facility];  Service: Endoscopy;  Laterality: N/A;  1:30  . HERNIA REPAIR    . NISSEN FUNDOPLICATION    . PACEMAKER INSERTION    . TRANSURETHRAL RESECTION OF PROSTATE    . vocal cord cancer removal      Family History  Problem Relation Age of Onset  . Colon cancer Neg Hx     Social History:  reports that he has never smoked. He has never used smokeless tobacco. He reports that he does not drink alcohol or use drugs.  Allergies:  Allergies  Allergen Reactions  . Prevacid [Lansoprazole] Other (See Comments)    Does not remeber  . Prilosec [Omeprazole] Other (See Comments)    Does not remeber  . Codeine Other (See Comments)    Does not know   . Penicillins Rash   Meds ordered this encounter  Medications  . fentaNYL (SUBLIMAZE) injection 50 mcg  . 0.9 %  sodium chloride infusion  . OR Linked Order Group   . acetaminophen (TYLENOL) tablet 650 mg   . acetaminophen (TYLENOL) suppository 650 mg  . HYDROmorphone  (DILAUDID) injection 0.5 mg  . ondansetron (ZOFRAN) injection 4 mg  . diphenhydrAMINE (BENADRYL) injection 25 mg  . levothyroxine (SYNTHROID) tablet 75 mcg  . carvedilol (COREG) tablet 3.125 mg  . 0.9 %  sodium chloride infusion (Manually program via Guardrails IV Fluids)     Results for orders placed or performed during the hospital encounter of 03/08/19 (from the past 48 hour(s))  Basic metabolic panel     Status: Abnormal   Collection Time: 03/08/19 11:58 PM  Result Value Ref Range   Sodium 139 135 - 145 mmol/L   Potassium 3.5 3.5 - 5.1 mmol/L   Chloride 105 98 - 111 mmol/L   CO2 23 22 - 32 mmol/L   Glucose, Bld 188 (H) 70 - 99 mg/dL   BUN 28 (H) 8 - 23 mg/dL   Creatinine, Ser 1.46 (H) 0.61 - 1.24 mg/dL   Calcium 8.7 (L) 8.9 - 10.3 mg/dL   GFR calc non Af Amer 41 (L) >60 mL/min   GFR calc Af Amer 48 (L) >60 mL/min   Anion gap 11 5 - 15    Comment: Performed at [facility], [address], [place], [place] [address]  CBC WITH DIFFERENTIAL     Status: Abnormal   Collection Time: 03/08/19 11:58 PM  Result Value Ref Range   WBC 12.6 (H) 4.0 - 10.5 K/uL   RBC  4.45 4.22 - 5.81 MIL/uL   Hemoglobin 14.6 13.0 - 17.0 g/dL   HCT 44.8 39.0 - 52.0 %   MCV 100.7 (H) 80.0 - 100.0 fL   MCH 32.8 26.0 - 34.0 pg   MCHC 32.6 30.0 - 36.0 g/dL   RDW 14.0 11.5 - 15.5 %   Platelets 173 150 - 400 K/uL   nRBC 0.0 0.0 - 0.2 %   Neutrophils Relative % 86 %   Neutro Abs 10.8 (H) 1.7 - 7.7 K/uL   Lymphocytes Relative 7 %   Lymphs Abs 0.9 0.7 - 4.0 K/uL   Monocytes Relative 7 %   Monocytes Absolute 0.9 0.1 - 1.0 K/uL   Eosinophils Relative 0 %   Eosinophils Absolute 0.0 0.0 - 0.5 K/uL   Basophils Relative 0 %   Basophils Absolute 0.0 0.0 - 0.1 K/uL   Immature Granulocytes 0 %   Abs Immature Granulocytes 0.05 0.00 - 0.07 K/uL    Comment: Performed at [facility], [address], [place], [place] [address]  Protime-INR     Status: None   Collection Time: 03/08/19 11:58 PM  Result Value Ref  Range   Prothrombin Time 14.1 11.4 - 15.2 seconds   INR 1.1 0.8 - 1.2    Comment: (NOTE) INR goal varies based on device and disease states. Performed at [facility], [address], [place], [place] [address]   Type and screen [facility]     Status: None   Collection Time: 03/09/19 12:14 AM  Result Value Ref Range   ABO/RH(D) A POS    Antibody Screen NEG    Sample Expiration      03/12/2019,2359 Performed at [facility], [address] [place], [place] [address]   SARS Coronavirus 2 (CEPHEID - Performed in [facility] hospital lab), Hosp Order     Status: None   Collection Time: 03/09/19  1:26 AM  Result Value Ref Range   SARS Coronavirus 2 NEGATIVE NEGATIVE    Comment: (NOTE) If result is NEGATIVE SARS-CoV-2 target nucleic acids are NOT DETECTED. The SARS-CoV-2 RNA is generally detectable in upper and lower  respiratory specimens during the acute phase of infection. The lowest  concentration of SARS-CoV-2 viral copies this assay can detect is 250  copies / mL. A negative result does not preclude SARS-CoV-2 infection  and should not be used as the sole basis for treatment or other  patient management decisions.  A negative result may occur with  improper specimen collection / handling, submission of specimen other  than nasopharyngeal swab, presence of viral mutation(s) within the  areas targeted by this assay, and inadequate number of viral copies  (<250 copies / mL). A negative result must be combined with clinical  observations, patient history, and epidemiological information. If result is POSITIVE SARS-CoV-2 target nucleic acids are DETECTED. The SARS-CoV-2 RNA is generally detectable in upper and lower  respiratory specimens dur ing the acute phase of infection.  Positive  results are indicative of active infection with SARS-CoV-2.  Clinical  correlation with patient history and other diagnostic information is  necessary to determine patient infection status.   Positive results do  not rule out bacterial infection or co-infection with other viruses. If result is PRESUMPTIVE POSTIVE SARS-CoV-2 nucleic acids MAY BE PRESENT.   A presumptive positive result was obtained on the submitted specimen  and confirmed on repeat testing.  While 2019 novel coronavirus  (SARS-CoV-2) nucleic acids may be present in the submitted sample  additional confirmatory testing may be necessary for epidemiological  and / or clinical management purposes  to differentiate between  SARS-CoV-2 and other Sarbecovirus currently known to infect humans.  If clinically indicated additional testing with an alternate test  methodology [phone]) is advised. The SARS-CoV-2 RNA is generally  detectable in upper and lower respiratory sp ecimens during the acute  phase of infection. The expected result is Negative. Fact Sheet for Patients:  StrictlyIdeas.no Fact Sheet for Healthcare Providers: BankingDealers.co.za This test is not yet approved or cleared by the [place] FDA and has been authorized for detection and/or diagnosis of SARS-CoV-2 by FDA under an Emergency Use Authorization (EUA).  This EUA will remain in effect (meaning this test can be used) for the duration of the COVID-19 declaration under Section 564(b)(1) of the Act, 21 U.S.C. section 360bbb-3(b)(1), unless the authorization is terminated or revoked sooner. Performed at [facility], [address], [place], [place] [address]   Troponin I - Add-On to previous collection     Status: None   Collection Time: 03/09/19  3:23 AM  Result Value Ref Range   Troponin I <0.03 <0.03 ng/mL    Comment: Performed at [facility], [address] [place], [place] [address]  Comprehensive metabolic panel     Status: Abnormal   Collection Time: 03/09/19  6:29 AM  Result Value Ref Range   Sodium 141 135 - 145 mmol/L   Potassium 3.7 3.5 - 5.1 mmol/L   Chloride 105 98 - 111 mmol/L    CO2 23 22 - 32 mmol/L   Glucose, Bld 168 (H) 70 - 99 mg/dL   BUN 27 (H) 8 - 23 mg/dL   Creatinine, Ser 1.21 0.61 - 1.24 mg/dL   Calcium 8.6 (L) 8.9 - 10.3 mg/dL   Total Protein 6.8 6.5 - 8.1 g/dL   Albumin 3.7 3.5 - 5.0 g/dL   AST 35 15 - 41 U/L   ALT 24 0 - 44 U/L   Alkaline Phosphatase 82 38 - 126 U/L   Total Bilirubin 1.1 0.3 - 1.2 mg/dL   GFR calc non Af Amer 52 (L) >60 mL/min   GFR calc Af Amer 60 (L) >60 mL/min   Anion gap 13 5 - 15    Comment: Performed at [facility], [address], [place], [place] [address]  CBC     Status: Abnormal   Collection Time: 03/09/19  6:29 AM  Result Value Ref Range   WBC 10.5 4.0 - 10.5 K/uL   RBC 3.90 (L) 4.22 - 5.81 MIL/uL   Hemoglobin 13.2 13.0 - 17.0 g/dL   HCT 38.9 (L) 39.0 - 52.0 %   MCV 99.7 80.0 - 100.0 fL   MCH 33.8 26.0 - 34.0 pg   MCHC 33.9 30.0 - 36.0 g/dL   RDW 14.1 11.5 - 15.5 %   Platelets 157 150 - 400 K/uL   nRBC 0.0 0.0 - 0.2 %    Comment: Performed at [facility], [address] [place],  [address]    Dg Chest 1 View  Result Date: 03/09/2019 CLINICAL DATA:  Fall EXAM: CHEST  1 VIEW COMPARISON:  None. FINDINGS: The heart size and mediastinal contours are within normal limits. Both lungs are clear. The visualized skeletal structures are unremarkable. Left chest wall pacemaker. IMPRESSION: No active disease. Electronically Signed   By: Ulyses Jarred M.D.   On: 03/09/2019 01:54   Dg Hip Unilat With Pelvis 2-3 Views Right  Result Date: 03/09/2019 CLINICAL DATA:  85 year old male with fall and right hip fracture. EXAM: DG HIP (WITH OR WITHOUT PELVIS) 2-3V RIGHT COMPARISON:  None. FINDINGS: There is a comminuted intertrochanteric fracture of the right femoral neck with mild valgus angulation. There is no dislocation. The bones are osteopenic. A hernia repair mesh is noted. The soft tissues are unremarkable. IMPRESSION: Comminuted intertrochanteric fracture of the right femoral neck. Electronically Signed   By: Anner Crete M.D.   On: 03/09/2019 01:55    Review of Systems  Unable to perform ROS: Acuity of condition   Blood pressure (!) 171/94, pulse 88, temperature 97.9 F (36.6 C), temperature source Oral, resp. rate 17, height 5\' 9"  (1.753 m), weight 68.3 kg, SpO2 97 %. Physical Exam Normal development grooming and hygiene Alert oriented to person place but not time, becomes confused at times Mood and affect are pleasant  Right and left upper extremity normal alignment, no joint contractures, joint subluxations none, muscle tone normal neurovascular exam intact skin clean but thin in various areas of ecchymosis on laceration right lateral forearm minor mild Left lower extremity Normal alignment, no joint contractures, joint subluxations none, muscle tone normal neurovascular exam intact skin clean nodes normal  Right lower extremity abnormal alignment abduction external rotation shortening, joint subluxations none muscle tone normal sensation normal pulse and perfusion normal skin normal lymph nodes negative in the groin  Assessment/Plan: X-rays show a right intertrochanteric fracture of the hip with a proximal fragment flexed  I spoke to the patient's son who I know well and he agrees to proceed with surgery after conversation regarding risk and benefits of surgical versus nonsurgical treatment.  He understands at the patient could have difficulty postoperatively based on his age but we expect reasonable recovery based on his activity level and overall health condition at this time.  His confusion may inhibit his postoperative course.  The blood bank has A positive and O- blood,  his hemoglobin is 13.2 we do not anticipate needing any blood.  We can proceed to the operating room without type and cross although 1 was ordered if needed later  Surgeon procedure planned open treatment internal fixation right hip with gamma nail  Arther Abbott 03/09/2019, 10:24 AM

## 2019-03-09 NOTE — ED Notes (Signed)
Report given to [facility] on 300; Dr. Maudie Mercury in with pt at this time

## 2019-03-09 NOTE — Anesthesia Procedure Notes (Signed)
Procedure Name: Intubation Date/Time: 03/09/2019 11:14 AM Performed by: Vena Rua, MD Pre-anesthesia Checklist: Patient identified, Emergency Drugs available, Suction available, Patient being monitored and Timeout performed Patient Re-evaluated:Patient Re-evaluated prior to induction Oxygen Delivery Method: Circle system utilized Preoxygenation: Pre-oxygenation with 100% oxygen Induction Type: IV induction, Cricoid Pressure applied and Rapid sequence Ventilation: Mask ventilation without difficulty Laryngoscope Size: Mac and 3 Grade View: Grade II Tube type: Oral Tube size: 7.0 mm Number of attempts: 1 Placement Confirmation: ETT inserted through vocal cords under direct vision,  positive ETCO2 and breath sounds checked- equal and bilateral Secured at: 22 cm Dental Injury: Teeth and Oropharynx as per pre-operative assessment

## 2019-03-09 NOTE — Transfer of Care (Signed)
Immediate Anesthesia Transfer of Care Note  Patient: Phillip Taylor  Procedure(s) Performed: INTRAMEDULLARY (IM) NAIL INTERTROCHANTRIC (Right )  Patient Location: PACU  Anesthesia Type:General  Level of Consciousness: awake and sedated  Airway & Oxygen Therapy: Patient Spontanous Breathing and Patient connected to face mask oxygen  Post-op Assessment: Report given to RN and Post -op Vital signs reviewed and stable  Post vital signs: Reviewed and stable  Last Vitals:  Vitals Value Taken Time  BP 145/93 03/09/2019  1:32 PM  Temp    Pulse 59 03/09/2019  1:35 PM  Resp 14 03/09/2019  1:35 PM  SpO2 100 % 03/09/2019  1:35 PM  Vitals shown include unvalidated device data.  Last Pain:  Vitals:   03/09/19 1019  TempSrc:   PainSc: 10-Worst pain ever      Patients Stated Pain Goal: 2 (46/95/07 2257)  Complications: No apparent anesthesia complications

## 2019-03-09 NOTE — Interval H&P Note (Signed)
History and Physical Interval Note:  03/09/2019 11:38 AM  Phillip Taylor  has presented today for surgery, with the diagnosis of right hip fracture.  The various methods of treatment have been discussed with the patient and family. After consideration of risks, benefits and other options for treatment, the patient has consented to  Procedure(s): INTRAMEDULLARY (IM) NAIL INTERTROCHANTRIC (Right) as a surgical intervention.  The patient's history has been reviewed, patient examined, no change in status, stable for surgery.  I have reviewed the patient's chart and labs.  Questions were answered to the patient's satisfaction.     Arther Abbott

## 2019-03-10 LAB — CBC
HCT: 31.4 % — ABNORMAL LOW (ref 39.0–52.0)
Hemoglobin: 10.3 g/dL — ABNORMAL LOW (ref 13.0–17.0)
MCH: 32.9 pg (ref 26.0–34.0)
MCHC: 32.8 g/dL (ref 30.0–36.0)
MCV: 100.3 fL — ABNORMAL HIGH (ref 80.0–100.0)
Platelets: 123 10*3/uL — ABNORMAL LOW (ref 150–400)
RBC: 3.13 MIL/uL — ABNORMAL LOW (ref 4.22–5.81)
RDW: 14.3 % (ref 11.5–15.5)
WBC: 8.8 10*3/uL (ref 4.0–10.5)
nRBC: 0 % (ref 0.0–0.2)

## 2019-03-10 LAB — BASIC METABOLIC PANEL
Anion gap: 10 (ref 5–15)
BUN: 22 mg/dL (ref 8–23)
CO2: 22 mmol/L (ref 22–32)
Calcium: 7.4 mg/dL — ABNORMAL LOW (ref 8.9–10.3)
Chloride: 105 mmol/L (ref 98–111)
Creatinine, Ser: 1.14 mg/dL (ref 0.61–1.24)
GFR calc Af Amer: >60 mL/min (ref >60)
GFR calc non Af Amer: 56 mL/min — ABNORMAL LOW (ref >60)
Glucose, Bld: 130 mg/dL — ABNORMAL HIGH (ref 70–99)
Potassium: 3.5 mmol/L (ref 3.5–5.1)
Sodium: 137 mmol/L (ref 135–145)

## 2019-03-10 MED ORDER — ENOXAPARIN SODIUM 40 MG/0.4ML ~~LOC~~ SOLN
40.0000 mg | SUBCUTANEOUS | Status: DC
  Administered 2019-03-11: 40 mg via SUBCUTANEOUS
  Filled 2019-03-10: qty 0.4

## 2019-03-10 NOTE — Progress Notes (Signed)
PROGRESS NOTE    Phillip Taylor  ZHG:[id number] DOB: 1925/04/17 DOA: 03/08/2019 PCP: [name], MD   Brief Narrative:  Per HPI: Phillip Taylor a85 y.o.male,w hypertension, carotid artery stenosis (R 60-90%, L 40-59% 2018), carotid sinus hypersensitivity s/p SJM DC-PPM implantation, h/o carcinoma of the larynx s/p resection and XRT 1995, Gerd, h/o carcinoma insitu esophagus, BPH s/p TURP x2, hypothyroidism, osteoporosis, apparently had fall w subsequent right hip pain. Pt was putting on his pajamas near the bed when he fell according to his son. No syncope. Pt denies cp, palp, sob, n/v, abd pain, diarrhea, brbpr, dysuria. Pt's son says that the patient has some underlying dementia.   Patient was admitted with right intertrochanteric fracture secondary to mechanical fall and also has some mild AKI secondary to dehydration and multiple medical issues.  Patient is doing well on the morning of 6/7 with pain that is well controlled after ORIF of the right hip on 6/6.  Kidney function appears to be improving as well.  Plans are for physical therapy evaluation.   Assessment & Plan:   Principal Problem:   Intertrochanteric fracture of right femur, closed, initial encounter ([place]) Active Problems:   Dehydration   Osteoporosis   Essential hypertension   Hypothyroidism   Intertrochanteric fracture (HCC)   Right intertrochanteric femur fracture status post mechanical fall status post gamma nail POD #1 -Patient doing well with pain control -We will need to remain on DVT prophylaxis for 35 days with Lovenox and follow-up 4 weeks after discharge to orthopedics -PT evaluation pending with likely discharge to rehab when stable  Dehydration with mild AKI-improved -Continue to hold Dyazide stable blood pressure readings noted -No further need for IV fluid  Hypertension -Continue carvedilol 3.125 p.o. twice daily  Hyperglycemia -Hemoglobin A1c 5.3% -No significant blood glucose elevations  noted  Hypothyroidism -Continue levothyroxine  Osteoporosis -Hold Fosamax for now  DVT prophylaxis:Lovenox Code Status: Full code Family Communication:Attempted call to son  Disposition Plan: PT evaluation and anticipate discharge to SNF when bed available.   Consultants:   Orthopedics  Procedures:   None  Antimicrobials:   None   Subjective: Patient seen and evaluated today with no new acute complaints or concerns. No acute concerns or events noted overnight.  Objective: Vitals:   03/10/19 0212 03/10/19 0500 03/10/19 0615 03/10/19 0816  BP: 97/62  (!) 100/58 118/66  Pulse: 90  89 89  Resp: 16  16 20   Temp: 98.1 F (36.7 C)  97.8 F (36.6 C) (!) 97.5 F (36.4 C)  TempSrc:      SpO2: 95%  96% 95%  Weight:  69.1 kg    Height:        Intake/Output Summary (Last 24 hours) at 03/10/2019 1127 Last data filed at 03/10/2019 1000 Gross per 24 hour  Intake 2964.13 ml  Output 50 ml  Net 2914.13 ml   Filed Weights   03/08/19 2357 03/09/19 0500 03/10/19 0500  Weight: 63.5 kg 68.3 kg 69.1 kg    Examination:  General exam: Appears calm and comfortable  Respiratory system: Clear to auscultation. Respiratory effort normal. Cardiovascular system: S1 & S2 heard, RRR. No JVD, murmurs, rubs, gallops or clicks. No pedal edema. Gastrointestinal system: Abdomen is nondistended, soft and nontender. No organomegaly or masses felt. Normal bowel sounds heard. Central nervous system: Alert and oriented. No focal neurological deficits. Extremities: Symmetric 5 x 5 power.  Right hip dressing clean dry and intact, but saturated Skin: No rashes, lesions or ulcers Psychiatry: Judgement and insight  appear normal. Mood & affect appropriate.     Data Reviewed: I have personally reviewed following labs and imaging studies  CBC: Recent Labs  Lab 03/08/19 2358 03/09/19 0629 03/10/19 0706  WBC 12.6* 10.5 8.8  NEUTROABS 10.8*  --   --   HGB 14.6 13.2 10.3*  HCT 44.8 38.9* 31.4*   MCV 100.7* 99.7 100.3*  PLT 173 157 299*   Basic Metabolic Panel: Recent Labs  Lab 03/08/19 2358 03/09/19 0629 03/10/19 0706  NA 139 141 137  K 3.5 3.7 3.5  CL 105 105 105  CO2 23 23 22   GLUCOSE 188* 168* 130*  BUN 28* 27* 22  CREATININE 1.46* 1.21 1.14  CALCIUM 8.7* 8.6* 7.4*   GFR: Estimated Creatinine Clearance: 40.4 mL/min (by C-G formula based on SCr of 1.14 mg/dL). Liver Function Tests: Recent Labs  Lab 03/09/19 0629  AST 35  ALT 24  ALKPHOS 82  BILITOT 1.1  PROT 6.8  ALBUMIN 3.7   No results for input(s): LIPASE, AMYLASE in the last 168 hours. No results for input(s): AMMONIA in the last 168 hours. Coagulation Profile: Recent Labs  Lab 03/08/19 2358  INR 1.1   Cardiac Enzymes: Recent Labs  Lab 03/09/19 0323  TROPONINI <0.03   BNP (last 3 results) No results for input(s): PROBNP in the last 8760 hours. HbA1C: Recent Labs    03/09/19 0629  HGBA1C 5.3   CBG: No results for input(s): GLUCAP in the last 168 hours. Lipid Profile: No results for input(s): CHOL, HDL, LDLCALC, TRIG, CHOLHDL, LDLDIRECT in the last 72 hours. Thyroid Function Tests: No results for input(s): TSH, T4TOTAL, FREET4, T3FREE, THYROIDAB in the last 72 hours. Anemia Panel: No results for input(s): VITAMINB12, FOLATE, FERRITIN, TIBC, IRON, RETICCTPCT in the last 72 hours. Sepsis Labs: No results for input(s): PROCALCITON, LATICACIDVEN in the last 168 hours.  Recent Results (from the past 240 hour(s))  SARS Coronavirus 2 (CEPHEID - Performed in [place] hospital lab), Hosp Order     Status: None   Collection Time: 03/09/19  1:26 AM  Result Value Ref Range Status   SARS Coronavirus 2 NEGATIVE NEGATIVE Final    Comment: (NOTE) If result is NEGATIVE SARS-CoV-2 target nucleic acids are NOT DETECTED. The SARS-CoV-2 RNA is generally detectable in upper and lower  respiratory specimens during the acute phase of infection. The lowest  concentration of SARS-CoV-2 viral copies  this assay can detect is 250  copies / mL. A negative result does not preclude SARS-CoV-2 infection  and should not be used as the sole basis for treatment or other  patient management decisions.  A negative result may occur with  improper specimen collection / handling, submission of specimen other  than nasopharyngeal swab, presence of viral mutation(s) within the  areas targeted by this assay, and inadequate number of viral copies  (<250 copies / mL). A negative result must be combined with clinical  observations, patient history, and epidemiological information. If result is POSITIVE SARS-CoV-2 target nucleic acids are DETECTED. The SARS-CoV-2 RNA is generally detectable in upper and lower  respiratory specimens dur ing the acute phase of infection.  Positive  results are indicative of active infection with SARS-CoV-2.  Clinical  correlation with patient history and other diagnostic information is  necessary to determine patient infection status.  Positive results do  not rule out bacterial infection or co-infection with other viruses. If result is PRESUMPTIVE POSTIVE SARS-CoV-2 nucleic acids MAY BE PRESENT.   A presumptive positive result was  obtained on the submitted specimen  and confirmed on repeat testing.  While 2019 novel coronavirus  (SARS-CoV-2) nucleic acids may be present in the submitted sample  additional confirmatory testing may be necessary for epidemiological  and / or clinical management purposes  to differentiate between  SARS-CoV-2 and other Sarbecovirus currently known to infect humans.  If clinically indicated additional testing with an alternate test  methodology [phone]) is advised. The SARS-CoV-2 RNA is generally  detectable in upper and lower respiratory sp ecimens during the acute  phase of infection. The expected result is Negative. Fact Sheet for Patients:  StrictlyIdeas.no Fact Sheet for Healthcare Providers:  BankingDealers.co.za This test is not yet approved or cleared by the [place] FDA and has been authorized for detection and/or diagnosis of SARS-CoV-2 by FDA under an Emergency Use Authorization (EUA).  This EUA will remain in effect (meaning this test can be used) for the duration of the COVID-19 declaration under Section 564(b)(1) of the Act, 21 U.S.C. section 360bbb-3(b)(1), unless the authorization is terminated or revoked sooner. Performed at [facility], [address], [place], [place] [address]   Surgical pcr screen     Status: None   Collection Time: 03/09/19 12:00 PM  Result Value Ref Range Status   MRSA, PCR NEGATIVE NEGATIVE Final   Staphylococcus aureus NEGATIVE NEGATIVE Final    Comment: (NOTE) The Xpert SA Assay (FDA approved for NASAL specimens in patients 83 years of age and older), is one component of a comprehensive surveillance program. It is not intended to diagnose infection nor to guide or monitor treatment. Performed at [facility], [address], [place], [place] [address]          Radiology Studies: Dg Chest 1 View  Result Date: 03/09/2019 CLINICAL DATA:  Fall EXAM: CHEST  1 VIEW COMPARISON:  None. FINDINGS: The heart size and mediastinal contours are within normal limits. Both lungs are clear. The visualized skeletal structures are unremarkable. Left chest wall pacemaker. IMPRESSION: No active disease. Electronically Signed   By: Ulyses Jarred M.D.   On: 03/09/2019 01:54   Dg Hip Operative Unilat With Pelvis Right  Result Date: 03/09/2019 CLINICAL DATA:  Right hip fracture. EXAM: OPERATIVE RIGHT HIP TECHNIQUE: Fluoroscopic spot image(s) were submitted for interpretation post-operatively. COMPARISON:  03/09/2019 FINDINGS: Fluoroscopic images were obtained for internal fixation of the right intertrochanteric femur fracture. Placement of intramedullary nail in the proximal right femur with a hip screw extending into the right  femoral head and neck. IMPRESSION: Internal fixation of the right femur fracture. Electronically Signed   By: Markus Daft M.D.   On: 03/09/2019 17:12   Dg Hip Unilat With Pelvis 2-3 Views Right  Result Date: 03/09/2019 CLINICAL DATA:  85 year old male with fall and right hip fracture. EXAM: DG HIP (WITH OR WITHOUT PELVIS) 2-3V RIGHT COMPARISON:  None. FINDINGS: There is a comminuted intertrochanteric fracture of the right femoral neck with mild valgus angulation. There is no dislocation. The bones are osteopenic. A hernia repair mesh is noted. The soft tissues are unremarkable. IMPRESSION: Comminuted intertrochanteric fracture of the right femoral neck. Electronically Signed   By: Anner Crete M.D.   On: 03/09/2019 01:55        Scheduled Meds: . sodium chloride   Intravenous Once  . carvedilol  3.125 mg Oral BID WC  . docusate sodium  100 mg Oral BID  . enoxaparin (LOVENOX) injection  30 mg Subcutaneous Q24H  . levothyroxine  75 mcg Oral Daily  . polyethylene  glycol  17 g Oral Daily   Continuous Infusions: . sodium chloride 75 mL/hr at 03/09/19 2216  . vancomycin       LOS: 1 day    Time spent: 30 minutes    Armondo Cech Darleen Crocker, DO [facility] Pager [phone]  If 7PM-7AM, please contact night-coverage [email or web address] Password [facility] 03/10/2019, 11:27 AM

## 2019-03-10 NOTE — Progress Notes (Signed)
Phone call out to case management, Josh Simser for placement to a SNF and PT, OT, DME. Consult order by Dr, Aline Brochure 03/09/2019. Phillip Taylor 03/10/2019 @ 1353

## 2019-03-10 NOTE — Plan of Care (Signed)
  Problem: Acute Rehab PT Goals(only PT should resolve) Goal: Pt Will Go Supine/Side To Sit Flowsheets (Taken 03/10/2019 1232) Pt will go Supine/Side to Sit: with min guard assist; with minimal assist Goal: Pt Will Go Sit To Supine/Side Flowsheets (Taken 03/10/2019 1232) Pt will go Sit to Supine/Side: with minimal assist; with min guard assist Goal: Patient Will Transfer Sit To/From Stand Flowsheets (Taken 03/10/2019 1232) Patient will transfer sit to/from stand: with minimal assist; with moderate assist Goal: Pt Will Transfer Bed To Chair/Chair To Bed Flowsheets (Taken 03/10/2019 1232) Pt will Transfer Bed to Chair/Chair to Bed: with min assist; with mod assist Goal: Pt Will Ambulate Flowsheets (Taken 03/10/2019 1232) Pt will Ambulate: 50 feet; 75 feet; with minimal assist; with moderate assist; with rolling walker Goal: Pt Will Go Up/Down Stairs Flowsheets (Taken 03/10/2019 1232) Pt will Go Up / Down Stairs: 1-2 stairs; with minimal assist; with moderate assist; with rail(s); with least restrictive assistive device    Geraldine Solar PT, DPT

## 2019-03-10 NOTE — Evaluation (Signed)
Physical Therapy Evaluation Patient Details Name: Phillip Taylor MRN: [id number] DOB: 1925-02-15 Today's Date: 03/10/2019   History of Present Illness  Juvon Teater  is a 85 y.o. 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  Pt was putting on his pajamas near the bed when he fell according to his son.  No syncope. Pt denies cp, palp, sob, n/v, abd pain, diarrhea, brbpr, dysuria.  Pt's son says that the patient has some underlying dementia.  Clinical Impression  Pt received in bed and was agreeable to PT evaluation. Pt admitted with above diagnosis and is s/p IM nail by Dr. Aline Brochure. Pt is WBAT. PLOF pt able to ambulate limited community distances, lived alone, still driving, taking care of ADLs, and IADLs. Currently, pt performing below baseline level of function AEB deficits in strength, balance, and functional mobility. Pt limtied secondary to pain and fear of RW going to tip over on him; PT and RN educated pt on safe use of it and ensured him that he would be safe to stand up. Pt required mod-max A for bed mobility and mod A x2 for STS with RW. Limited to a few short side steps at EOB due to weakness and pain this date. Pt declined sitting up in chair at EOS, stating that he wanted to rest and not do anything for a while after therapy. PT recommending SNF upon d/c, however, pt does have high rehab potential and could possibly be d/c home with [facility] services and 24/7 assistance if he progresses well enough acutely; will update d/c recommendations as needed.    Follow Up Recommendations SNF    Equipment Recommendations  None recommended by PT    Recommendations for Other Services       Precautions / Restrictions  Precautions Precautions: Fall Restrictions RLE Weight Bearing: Weight bearing as tolerated      Mobility  Bed Mobility Overal bed mobility: Needs Assistance Bed Mobility: Sit to Supine;Supine to Sit     Supine to sit: Mod assist;Max assist Sit to supine: Max assist   General bed mobility comments: limited due to R hip pain and weakness  Transfers Overall transfer level: Needs assistance Equipment used: Rolling walker (2 wheeled) Transfers: Sit to/from Stand Sit to Stand: Mod assist;+2 physical assistance         General transfer comment: mod A x2 for STS with RW; cus for safe hand placement  Ambulation/Gait Ambulation/Gait assistance: Mod assist;Min assist;+2 physical assistance Gait Distance (Feet): 2 Feet Assistive device: Rolling walker (2 wheeled) Gait Pattern/deviations: Step-to pattern;Decreased stance time - right;Decreased stride length;Antalgic     General Gait Details: limited to a few short side steps at EOB with RW -- PT and RN assisting with min-mod each for safety and sequencing; some R knee buckling noticed towards end but did not fully give way  Stairs            Wheelchair Mobility    Modified Rankin (Stroke Patients Only)       Balance Overall balance assessment: Needs assistance Sitting-balance support: Feet supported Sitting balance-Leahy Scale: Good     Standing balance support: Bilateral upper extremity supported Standing balance-Leahy Scale: Poor Standing balance comment: fair/poor with RW  Pertinent Vitals/Pain Pain Assessment: Faces Faces Pain Scale: Hurts even more Pain Location: R hip Pain Descriptors / Indicators: Aching Pain Intervention(s): Limited activity within patient's tolerance;Monitored during session;Repositioned;Ice applied    Home Living Family/patient expects to be discharged to:: Private residence Living Arrangements: Alone Available Help at Discharge: Available 24  hours/day Type of Home: House Home Access: Stairs to enter Entrance Stairs-Rails: Can reach both Entrance Stairs-Number of Steps: 1 Home Layout: One level Home Equipment: Walker - 2 wheels;Shower seat - built in      Prior Function Level of Independence: Independent         Comments: pt reports independence with all ADLs, IADLs - states he could walk 0.69miles without AD at baseline     Hand Dominance        Extremity/Trunk Assessment   Upper Extremity Assessment Upper Extremity Assessment: Overall WFL for tasks assessed    Lower Extremity Assessment Lower Extremity Assessment: Generalized weakness       Communication   Communication: HOH  Cognition Arousal/Alertness: Awake/alert Behavior During Therapy: WFL for tasks assessed/performed Overall Cognitive Status: Within Functional Limits for tasks assessed                                        General Comments      Exercises Total Joint Exercises Ankle Circles/Pumps: Both;10 reps;Supine Long Arc Quad: Right;10 reps;Seated Marching in Standing: Right;10 reps;Seated;Limitations Marching in Standing Limitations: min A to complete due to weakness   Assessment/Plan    PT Assessment Patient needs continued PT services  PT Problem List Decreased strength;Decreased range of motion;Decreased activity tolerance;Decreased balance;Decreased mobility;Decreased knowledge of use of DME;Pain       PT Treatment Interventions DME instruction;Gait training;Stair training;Functional mobility training;Therapeutic activities;Therapeutic exercise;Balance training;Neuromuscular re-education;Patient/family education;Manual techniques;Modalities    PT Goals (Current goals can be found in the Care Plan section)  Acute Rehab PT Goals Patient Stated Goal: go home PT Goal Formulation: With patient Time For Goal Achievement: 03/17/19 Potential to Achieve Goals: Good    Frequency 7X/week   Barriers to discharge         Co-evaluation               AM-PAC PT "6 Clicks" Mobility  Outcome Measure Help needed turning from your back to your side while in a flat bed without using bedrails?: A Little Help needed moving from lying on your back to sitting on the side of a flat bed without using bedrails?: A Lot Help needed moving to and from a bed to a chair (including a wheelchair)?: A Lot Help needed standing up from a chair using your arms (e.g., wheelchair or bedside chair)?: A Lot Help needed to walk in hospital room?: A Lot Help needed climbing 3-5 steps with a railing? : Total 6 Click Score: 12    End of Session Equipment Utilized During Treatment: Gait belt Activity Tolerance: Patient tolerated treatment well Patient left: in bed;with call bell/phone within reach;with nursing/sitter in room Nurse Communication: Mobility status PT Visit Diagnosis: Muscle weakness (generalized) (M62.81);Difficulty in walking, not elsewhere classified (R26.2);Unsteadiness on feet (R26.81);Other abnormalities of gait and mobility (R26.89);Pain Pain - Right/Left: Right Pain - part of body: Hip    Time: 6578-4696 PT Time Calculation (min) (ACUTE ONLY): 35 min   Charges:   PT Evaluation $PT Eval Low Complexity: 1 Low PT Treatments $Therapeutic Activity: 8-22 mins  Geraldine Solar PT, DPT

## 2019-03-11 ENCOUNTER — Inpatient Hospital Stay
Admission: RE | Admit: 2019-03-11 | Discharge: 2019-04-11 | Disposition: A | Discharge status: Other Acute Inpatient Hospital | Payer: Medicare Other | Source: Ambulatory Visit | Attending: Internal Medicine | Admitting: Internal Medicine

## 2019-03-11 ENCOUNTER — Encounter (HOSPITAL_COMMUNITY): Payer: Self-pay | Admitting: Orthopedic Surgery

## 2019-03-11 ENCOUNTER — Encounter (HOSPITAL_COMMUNITY): Payer: Self-pay | Admitting: Internal Medicine

## 2019-03-11 DIAGNOSIS — K269 Duodenal ulcer, unspecified as acute or chronic, without hemorrhage or perforation: Secondary | ICD-10-CM | POA: Diagnosis not present

## 2019-03-11 DIAGNOSIS — E876 Hypokalemia: Secondary | ICD-10-CM | POA: Diagnosis not present

## 2019-03-11 DIAGNOSIS — Z8501 Personal history of malignant neoplasm of esophagus: Secondary | ICD-10-CM | POA: Diagnosis not present

## 2019-03-11 DIAGNOSIS — R55 Syncope and collapse: Secondary | ICD-10-CM | POA: Diagnosis not present

## 2019-03-11 DIAGNOSIS — E860 Dehydration: Secondary | ICD-10-CM | POA: Diagnosis not present

## 2019-03-11 DIAGNOSIS — R634 Abnormal weight loss: Secondary | ICD-10-CM | POA: Diagnosis not present

## 2019-03-11 DIAGNOSIS — G9001 Carotid sinus syncope: Secondary | ICD-10-CM | POA: Diagnosis not present

## 2019-03-11 DIAGNOSIS — Z79899 Other long term (current) drug therapy: Secondary | ICD-10-CM | POA: Diagnosis not present

## 2019-03-11 DIAGNOSIS — Z950 Presence of cardiac pacemaker: Secondary | ICD-10-CM | POA: Diagnosis not present

## 2019-03-11 DIAGNOSIS — Z1159 Encounter for screening for other viral diseases: Secondary | ICD-10-CM | POA: Diagnosis not present

## 2019-03-11 DIAGNOSIS — R748 Abnormal levels of other serum enzymes: Principal | ICD-10-CM

## 2019-03-11 DIAGNOSIS — I6523 Occlusion and stenosis of bilateral carotid arteries: Secondary | ICD-10-CM | POA: Diagnosis not present

## 2019-03-11 DIAGNOSIS — E039 Hypothyroidism, unspecified: Secondary | ICD-10-CM | POA: Diagnosis not present

## 2019-03-11 DIAGNOSIS — K824 Cholesterolosis of gallbladder: Secondary | ICD-10-CM | POA: Diagnosis not present

## 2019-03-11 DIAGNOSIS — Z86010 Personal history of colonic polyps: Secondary | ICD-10-CM | POA: Diagnosis not present

## 2019-03-11 DIAGNOSIS — D62 Acute posthemorrhagic anemia: Secondary | ICD-10-CM | POA: Diagnosis not present

## 2019-03-11 DIAGNOSIS — S72001D Fracture of unspecified part of neck of right femur, subsequent encounter for closed fracture with routine healing: Secondary | ICD-10-CM | POA: Diagnosis not present

## 2019-03-11 DIAGNOSIS — S72141A Displaced intertrochanteric fracture of right femur, initial encounter for closed fracture: Secondary | ICD-10-CM | POA: Diagnosis not present

## 2019-03-11 DIAGNOSIS — Z885 Allergy status to narcotic agent status: Secondary | ICD-10-CM | POA: Diagnosis not present

## 2019-03-11 DIAGNOSIS — Z8521 Personal history of malignant neoplasm of larynx: Secondary | ICD-10-CM | POA: Diagnosis not present

## 2019-03-11 DIAGNOSIS — K2950 Unspecified chronic gastritis without bleeding: Secondary | ICD-10-CM | POA: Diagnosis not present

## 2019-03-11 DIAGNOSIS — K828 Other specified diseases of gallbladder: Secondary | ICD-10-CM | POA: Diagnosis not present

## 2019-03-11 DIAGNOSIS — N400 Enlarged prostate without lower urinary tract symptoms: Secondary | ICD-10-CM | POA: Diagnosis not present

## 2019-03-11 DIAGNOSIS — M810 Age-related osteoporosis without current pathological fracture: Secondary | ICD-10-CM | POA: Diagnosis not present

## 2019-03-11 DIAGNOSIS — R945 Abnormal results of liver function studies: Secondary | ICD-10-CM | POA: Diagnosis not present

## 2019-03-11 DIAGNOSIS — R1310 Dysphagia, unspecified: Secondary | ICD-10-CM | POA: Diagnosis not present

## 2019-03-11 DIAGNOSIS — I10 Essential (primary) hypertension: Secondary | ICD-10-CM | POA: Diagnosis not present

## 2019-03-11 DIAGNOSIS — K222 Esophageal obstruction: Secondary | ICD-10-CM | POA: Diagnosis not present

## 2019-03-11 DIAGNOSIS — K3189 Other diseases of stomach and duodenum: Secondary | ICD-10-CM | POA: Diagnosis not present

## 2019-03-11 DIAGNOSIS — Z9181 History of falling: Secondary | ICD-10-CM | POA: Diagnosis not present

## 2019-03-11 DIAGNOSIS — S72144S Nondisplaced intertrochanteric fracture of right femur, sequela: Secondary | ICD-10-CM | POA: Diagnosis not present

## 2019-03-11 DIAGNOSIS — Z681 Body mass index (BMI) 19 or less, adult: Secondary | ICD-10-CM | POA: Diagnosis not present

## 2019-03-11 DIAGNOSIS — K5909 Other constipation: Secondary | ICD-10-CM | POA: Diagnosis not present

## 2019-03-11 DIAGNOSIS — S72141D Displaced intertrochanteric fracture of right femur, subsequent encounter for closed fracture with routine healing: Secondary | ICD-10-CM | POA: Diagnosis not present

## 2019-03-11 DIAGNOSIS — Z87438 Personal history of other diseases of male genital organs: Secondary | ICD-10-CM | POA: Diagnosis not present

## 2019-03-11 DIAGNOSIS — R262 Difficulty in walking, not elsewhere classified: Secondary | ICD-10-CM | POA: Diagnosis not present

## 2019-03-11 DIAGNOSIS — F0390 Unspecified dementia without behavioral disturbance: Secondary | ICD-10-CM | POA: Diagnosis not present

## 2019-03-11 DIAGNOSIS — K219 Gastro-esophageal reflux disease without esophagitis: Secondary | ICD-10-CM | POA: Diagnosis not present

## 2019-03-11 DIAGNOSIS — M6281 Muscle weakness (generalized): Secondary | ICD-10-CM | POA: Diagnosis not present

## 2019-03-11 DIAGNOSIS — Z880 Allergy status to penicillin: Secondary | ICD-10-CM | POA: Diagnosis not present

## 2019-03-11 DIAGNOSIS — I110 Hypertensive heart disease with heart failure: Secondary | ICD-10-CM | POA: Diagnosis present

## 2019-03-11 DIAGNOSIS — R630 Anorexia: Secondary | ICD-10-CM | POA: Diagnosis not present

## 2019-03-11 DIAGNOSIS — Z7901 Long term (current) use of anticoagulants: Secondary | ICD-10-CM | POA: Diagnosis not present

## 2019-03-11 DIAGNOSIS — Z741 Need for assistance with personal care: Secondary | ICD-10-CM | POA: Diagnosis not present

## 2019-03-11 DIAGNOSIS — Z4789 Encounter for other orthopedic aftercare: Secondary | ICD-10-CM | POA: Diagnosis not present

## 2019-03-11 DIAGNOSIS — E43 Unspecified severe protein-calorie malnutrition: Secondary | ICD-10-CM | POA: Diagnosis not present

## 2019-03-11 DIAGNOSIS — R41841 Cognitive communication deficit: Secondary | ICD-10-CM | POA: Diagnosis not present

## 2019-03-11 DIAGNOSIS — K319 Disease of stomach and duodenum, unspecified: Secondary | ICD-10-CM | POA: Diagnosis not present

## 2019-03-11 DIAGNOSIS — Z888 Allergy status to other drugs, medicaments and biological substances status: Secondary | ICD-10-CM | POA: Diagnosis not present

## 2019-03-11 LAB — CBC
HCT: 30.4 % — ABNORMAL LOW (ref 39.0–52.0)
Hemoglobin: 10 g/dL — ABNORMAL LOW (ref 13.0–17.0)
MCH: 33.1 pg (ref 26.0–34.0)
MCHC: 32.9 g/dL (ref 30.0–36.0)
MCV: 100.7 fL — ABNORMAL HIGH (ref 80.0–100.0)
Platelets: 133 10*3/uL — ABNORMAL LOW (ref 150–400)
RBC: 3.02 MIL/uL — ABNORMAL LOW (ref 4.22–5.81)
RDW: 14.1 % (ref 11.5–15.5)
WBC: 7.9 10*3/uL (ref 4.0–10.5)
nRBC: 0 % (ref 0.0–0.2)

## 2019-03-11 MED ORDER — LEVOTHYROXINE SODIUM 75 MCG PO TABS: 75.0000 ug | ORAL_TABLET | Freq: Every day | ORAL | 0 refills | Status: DC

## 2019-03-11 MED ORDER — CARVEDILOL 3.125 MG PO TABS: 3.1250 mg | ORAL_TABLET | Freq: Two times a day (BID) | ORAL | 0 refills | Status: DC

## 2019-03-11 MED ORDER — HYDROCODONE-ACETAMINOPHEN 5-325 MG PO TABS: 1.0000 | ORAL_TABLET | ORAL | 0 refills | Status: DC | PRN

## 2019-03-11 MED ORDER — DOCUSATE SODIUM 100 MG PO CAPS: 100.0000 mg | ORAL_CAPSULE | Freq: Two times a day (BID) | ORAL | 0 refills | Status: DC

## 2019-03-11 MED ORDER — ENOXAPARIN SODIUM 40 MG/0.4ML ~~LOC~~ SOLN: 40.0000 mg | SUBCUTANEOUS | 0 refills | Status: DC

## 2019-03-11 NOTE — NC FL2 (Signed)
[place] LEVEL OF CARE SCREENING TOOL     IDENTIFICATION  Patient Name: Phillip Taylor Birthdate: [birth year] Sex: male Admission Date (Current Location): 03/08/2019  [facility] and [place] Number:  [employer] and Address:  [place] [address] [place]      Provider Number: [id number]  Attending Physician Name and Address:  Rodena Goldmann, DO  Relative Name and Phone Number:  Delia Sitar (son) [phone]    Current Level of Care: Hospital Recommended Level of Care: [place] Prior Approval Number:    Date Approved/Denied:   PASRR Number: [id number]  Discharge Plan: SNF    Current Diagnoses: Patient Active Problem List   Diagnosis Date Noted  . Intertrochanteric fracture of right femur, closed, initial encounter ([place]) 03/09/2019  . Dehydration 03/09/2019  . Osteoporosis 03/09/2019  . Essential hypertension 03/09/2019  . Hypothyroidism 03/09/2019  . Intertrochanteric fracture ([place]) 03/09/2019  . Heme positive stool 10/17/2012    Orientation RESPIRATION BLADDER Height & Weight     Self, Place  Normal Incontinent Weight: 152 lb 5.4 oz (69.1 kg) Height:  5\' 9"  (175.3 cm)  BEHAVIORAL SYMPTOMS/MOOD NEUROLOGICAL BOWEL NUTRITION STATUS      Continent Diet(see dc summary)  AMBULATORY STATUS COMMUNICATION OF NEEDS Skin   Extensive Assist Verbally Surgical wounds                       Personal Care Assistance Level of Assistance  Bathing, Feeding, Dressing Bathing Assistance: Limited assistance Feeding assistance: Limited assistance Dressing Assistance: Limited assistance     Functional Limitations Info  Sight, Hearing, Speech Sight Info: Adequate Hearing Info: Adequate Speech Info: Adequate    SPECIAL CARE FACTORS FREQUENCY  OT (By licensed OT)     PT Frequency: 5 times week OT Frequency: 2 times week            Contractures Contractures Info: Not present    Additional Factors  Info  Code Status, Allergies Code Status Info: full Allergies Info: Prevacid, Prilosec, Codeine, Penicillins           Current Medications (03/11/2019):  This is the current hospital active medication list Current Facility-Administered Medications  Medication Dose Route Frequency Provider Last Rate Last Dose  . 0.9 %  sodium chloride infusion   Intravenous Continuous Carole Civil, MD 75 mL/hr at 03/09/19 2216    . carvedilol (COREG) tablet 3.125 mg  3.125 mg Oral BID WC Carole Civil, MD   3.125 mg at 03/11/19 0844  . diphenhydrAMINE (BENADRYL) injection 25 mg  25 mg Intravenous Q6H PRN Carole Civil, MD      . docusate sodium (COLACE) capsule 100 mg  100 mg Oral BID Carole Civil, MD   100 mg at 03/11/19 0844  . enoxaparin (LOVENOX) injection 40 mg  40 mg Subcutaneous Q24H Manuella Ghazi, Pratik D, DO   40 mg at 03/11/19 0844  . HYDROmorphone (DILAUDID) injection 0.5 mg  0.5 mg Intravenous Q4H PRN Carole Civil, MD   0.5 mg at 03/10/19 1052  . levothyroxine (SYNTHROID) tablet 75 mcg  75 mcg Oral Daily Carole Civil, MD   75 mcg at 03/11/19 0556  . menthol-cetylpyridinium (CEPACOL) lozenge 3 mg  1 lozenge Oral PRN Carole Civil, MD       Or  . phenol (CHLORASEPTIC) mouth spray 1 spray  1 spray Mouth/Throat PRN Carole Civil, MD      .  metoCLOPramide (REGLAN) tablet 5-10 mg  5-10 mg Oral Q8H PRN Carole Civil, MD       Or  . metoCLOPramide (REGLAN) injection 5-10 mg  5-10 mg Intravenous Q8H PRN Carole Civil, MD   10 mg at 03/09/19 1739  . ondansetron (ZOFRAN) injection 4 mg  4 mg Intravenous Q6H PRN Carole Civil, MD   4 mg at 03/10/19 1413  . ondansetron (ZOFRAN) tablet 4 mg  4 mg Oral Q6H PRN Carole Civil, MD       Or  . ondansetron [facility]) injection 4 mg  4 mg Intravenous Q6H PRN Carole Civil, MD      . polyethylene glycol (MIRALAX / GLYCOLAX) packet 17 g  17 g Oral Daily Carole Civil, MD   17 g at 03/11/19  0355     Discharge Medications: Please see discharge summary for a list of discharge medications.  Relevant Imaging Results:  Relevant Lab Results:   Additional Information SSN: [id number], LCSW

## 2019-03-11 NOTE — NC FL2 (Deleted)
[facility] LEVEL OF CARE SCREENING TOOL     IDENTIFICATION  Patient Name: Phillip Taylor Birthdate: 10/29/1924 Sex: male Admission Date (Current Location): 03/08/2019  [facility] and [place] Number:  [employer] and Address:  [place] [address], [place]      Provider Number: [id number]  Attending Physician Name and Address:  Rodena Goldmann, DO  Relative Name and Phone Number:  Rondell Pardon (son) [phone]    Current Level of Care: Hospital Recommended Level of Care: [place] Prior Approval Number:    Date Approved/Denied:   PASRR Number: [id number]  Discharge Plan: SNF    Current Diagnoses: Patient Active Problem List   Diagnosis Date Noted  . Intertrochanteric fracture of right femur, closed, initial encounter ([place]) 03/09/2019  . Dehydration 03/09/2019  . Osteoporosis 03/09/2019  . Essential hypertension 03/09/2019  . Hypothyroidism 03/09/2019  . Intertrochanteric fracture ([place]) 03/09/2019  . Heme positive stool 10/17/2012    Orientation RESPIRATION BLADDER Height & Weight     Self, Place  Normal Incontinent Weight: 152 lb 5.4 oz (69.1 kg) Height:  5\' 9"  (175.3 cm)  BEHAVIORAL SYMPTOMS/MOOD NEUROLOGICAL BOWEL NUTRITION STATUS      Continent Diet(see dc summary)  AMBULATORY STATUS COMMUNICATION OF NEEDS Skin   Extensive Assist Verbally Surgical wounds                       Personal Care Assistance Level of Assistance  Bathing, Feeding, Dressing Bathing Assistance: Limited assistance Feeding assistance: Limited assistance Dressing Assistance: Limited assistance     Functional Limitations Info  Sight, Hearing, Speech Sight Info: Adequate Hearing Info: Adequate Speech Info: Adequate    SPECIAL CARE FACTORS FREQUENCY  PT (By licensed PT)     PT Frequency: 5 times week              Contractures Contractures Info: Not present    Additional Factors Info  Code Status,  Allergies Code Status Info: full Allergies Info: Prevacid, Prilosec, Codeine, Penicillins           Current Medications (03/11/2019):  This is the current hospital active medication list Current Facility-Administered Medications  Medication Dose Route Frequency Provider Last Rate Last Dose  . 0.9 %  sodium chloride infusion   Intravenous Continuous Carole Civil, MD 75 mL/hr at 03/09/19 2216    . carvedilol (COREG) tablet 3.125 mg  3.125 mg Oral BID WC Carole Civil, MD   3.125 mg at 03/11/19 0844  . diphenhydrAMINE (BENADRYL) injection 25 mg  25 mg Intravenous Q6H PRN Carole Civil, MD      . docusate sodium (COLACE) capsule 100 mg  100 mg Oral BID Carole Civil, MD   100 mg at 03/11/19 0844  . enoxaparin (LOVENOX) injection 40 mg  40 mg Subcutaneous Q24H Manuella Ghazi, Pratik D, DO   40 mg at 03/11/19 0844  . HYDROmorphone (DILAUDID) injection 0.5 mg  0.5 mg Intravenous Q4H PRN Carole Civil, MD   0.5 mg at 03/10/19 1052  . levothyroxine (SYNTHROID) tablet 75 mcg  75 mcg Oral Daily Carole Civil, MD   75 mcg at 03/11/19 0556  . menthol-cetylpyridinium (CEPACOL) lozenge 3 mg  1 lozenge Oral PRN Carole Civil, MD       Or  . phenol (CHLORASEPTIC) mouth spray 1 spray  1 spray Mouth/Throat PRN Carole Civil, MD      . metoCLOPramide [facility]) tablet  5-10 mg  5-10 mg Oral Q8H PRN Carole Civil, MD       Or  . metoCLOPramide (REGLAN) injection 5-10 mg  5-10 mg Intravenous Q8H PRN Carole Civil, MD   10 mg at 03/09/19 1739  . ondansetron (ZOFRAN) injection 4 mg  4 mg Intravenous Q6H PRN Carole Civil, MD   4 mg at 03/10/19 1413  . ondansetron (ZOFRAN) tablet 4 mg  4 mg Oral Q6H PRN Carole Civil, MD       Or  . ondansetron [facility]) injection 4 mg  4 mg Intravenous Q6H PRN Carole Civil, MD      . polyethylene glycol (MIRALAX / GLYCOLAX) packet 17 g  17 g Oral Daily Carole Civil, MD   17 g at 03/11/19 2010     Discharge  Medications: Please see discharge summary for a list of discharge medications.  Relevant Imaging Results:  Relevant Lab Results:   Additional Information SSN: [id number], LCSW

## 2019-03-11 NOTE — Evaluation (Signed)
Clinical/Bedside Swallow Evaluation Patient Details  Name: Phillip Taylor MRN: [id number] Date of Birth: 1925/01/23  Today's Date: 03/11/2019 Time: SLP Start Time (ACUTE ONLY): 1104 SLP Stop Time (ACUTE ONLY): 1126 SLP Time Calculation (min) (ACUTE ONLY): 22 min  Past Medical History:  Past Medical History:  Diagnosis Date  . BPH (benign prostatic hyperplasia)   . Carotid sinus hypersensitivity   . Carotid stenosis, asymptomatic, bilateral   . GERD (gastroesophageal reflux disease)   . Hypertension   . Hypothyroidism   . Osteoporosis   . PONV (postoperative nausea and vomiting)   . Vocal cord cancer (HCC)    at least 13 years ago   Past Surgical History:  Past Surgical History:  Procedure Laterality Date  . COLONOSCOPY  10/25/2012   Procedure: COLONOSCOPY;  Surgeon: Daneil Dolin, MD;  Location: [facility] SUITE;  Service: Endoscopy;  Laterality: N/A;  1:30  . HERNIA REPAIR    . NISSEN FUNDOPLICATION    . PACEMAKER INSERTION    . TRANSURETHRAL RESECTION OF PROSTATE    . vocal cord cancer removal     HPI:  [name] a89 y.o.male,w hypertension, carotid artery stenosis (R 60-90%, L 40-59% 2018), carotid sinus hypersensitivity s/p SJM DC-PPM implantation, h/o carcinoma of the larynx s/p resection and XRT 1995, Gerd, h/o carcinoma insitu esophagus, BPH s/p TURP x2, hypothyroidism, osteoporosis, apparently had fall w subsequent right hip pain. Pt was putting on his pajamas near the bed when he fell according to his son. No syncope. Pt denies cp, palp, sob, n/v, abd pain, diarrhea, brbpr, dysuria. Pt's son says that the patient has some underlying dementia. Patient was admitted with right intertrochanteric fracture secondary to mechanical fall and also has some mild AKI secondary to dehydration and multiple medical issues.Patient is doing well on the morning of 6/7 with pain that is well controlled after ORIF of the right hip on 6/6. Kidney function appears to be improving as  well.  Physical therapy has seen patient on 6/7 and has recommended SNF/rehab on discharge.  He is otherwise stable and doing well with good pain control noted.  No other acute events noted during the course of this admission. BSE requested.    Assessment / Plan / Recommendation Clinical Impression  Clinical swallow evaluation completed at bedside. Pt alert and cooperative. He endorses previous procedure to his vocal folds ~10 years ago at [facility] and states that he gets "strangled easily" if he doesn't take his time. Oral motor examination is WNL with the exception of xerostomia (Pt reports this is baseline) and mild dysphonia/hoarse vocal quality which Pt also reports is baseline. Pt with reduced rotary mastication with solids and prolonged oral transit. Pt with one episode of coughing after self presenting graham crackers and straw sips thin liquid when staff entered room and spoke to Pt. Pt acknowledges need to take his time and take small sips. Will change to D3/mech soft and thin liquids with standard aspiration and reflux precautions; po medications whole with water. SLP will follow during acute stay.   SLP Visit Diagnosis: Dysphagia, unspecified (R13.10)    Aspiration Risk  Mild aspiration risk    Diet Recommendation Dysphagia 3 (Mech soft);Thin liquid   Liquid Administration via: Cup;Straw Medication Administration: Whole meds with liquid Supervision: Patient able to self feed;Intermittent supervision to cue for compensatory strategies Compensations: Slow rate;Small sips/bites Postural Changes: Seated upright at 90 degrees;Remain upright for at least 30 minutes after po intake    Other  Recommendations Oral Care Recommendations: Oral  care BID;Staff/trained caregiver to provide oral care Other Recommendations: Clarify dietary restrictions   Follow up Recommendations Skilled Nursing facility      Frequency and Duration min 2x/week  1 week       Prognosis Prognosis for Safe Diet  Advancement: Good      Swallow Study   General Date of Onset: 03/09/19 HPI: [name] a89 y.o.male,w hypertension, carotid artery stenosis (R 60-90%, L 40-59% 2018), carotid sinus hypersensitivity s/p SJM DC-PPM implantation, h/o carcinoma of the larynx s/p resection and XRT 1995, Gerd, h/o carcinoma insitu esophagus, BPH s/p TURP x2, hypothyroidism, osteoporosis, apparently had fall w subsequent right hip pain. Pt was putting on his pajamas near the bed when he fell according to his son. No syncope. Pt denies cp, palp, sob, n/v, abd pain, diarrhea, brbpr, dysuria. Pt's son says that the patient has some underlying dementia. Patient was admitted with right intertrochanteric fracture secondary to mechanical fall and also has some mild AKI secondary to dehydration and multiple medical issues.Patient is doing well on the morning of 6/7 with pain that is well controlled after ORIF of the right hip on 6/6. Kidney function appears to be improving as well.  Physical therapy has seen patient on 6/7 and has recommended SNF/rehab on discharge.  He is otherwise stable and doing well with good pain control noted.  No other acute events noted during the course of this admission. BSE requested.  Type of Study: Bedside Swallow Evaluation Previous Swallow Assessment: none on record Diet Prior to this Study: Regular;Thin liquids Temperature Spikes Noted: No Respiratory Status: Room air History of Recent Intubation: Yes Length of Intubations (days): 1 days Date extubated: 03/10/19 Behavior/Cognition: Alert;Cooperative;Pleasant mood Oral Cavity Assessment: Within Functional Limits Oral Care Completed by SLP: No Oral Cavity - Dentition: Adequate natural dentition Vision: Functional for self-feeding Self-Feeding Abilities: Able to feed self Patient Positioning: Upright in bed Baseline Vocal Quality: Hoarse Volitional Cough: Strong Volitional Swallow: Able to elicit    Oral/Motor/Sensory Function  Overall Oral Motor/Sensory Function: Within functional limits   Ice Chips Ice chips: Within functional limits Presentation: Spoon   Thin Liquid Thin Liquid: Within functional limits Presentation: Cup;Self Fed;Straw    Nectar Thick Nectar Thick Liquid: Not tested   Honey Thick Honey Thick Liquid: Not tested   Puree Puree: Within functional limits Presentation: Spoon   Solid     Solid: Impaired Presentation: Self Fed Oral Phase Impairments: Impaired mastication Oral Phase Functional Implications: Impaired mastication Pharyngeal Phase Impairments: Cough - Delayed     Thank you,  Genene Churn, [place]  , 03/11/2019,11:29 AM

## 2019-03-11 NOTE — Plan of Care (Signed)
  Problem: Acute Rehab OT Goals (only OT should resolve) Goal: Pt. Will Perform Upper Body Bathing Flowsheets (Taken 03/11/2019 1056) Pt Will Perform Upper Body Bathing: with modified independence; sitting Goal: Pt. Will Perform Lower Body Bathing Flowsheets (Taken 03/11/2019 1056) Pt Will Perform Lower Body Bathing: with mod assist; sitting/lateral leans; sit to/from stand Goal: Pt. Will Perform Upper Body Dressing Flowsheets (Taken 03/11/2019 1056) Pt Will Perform Upper Body Dressing: with modified independence; sitting Goal: Pt. Will Perform Lower Body Dressing Flowsheets (Taken 03/11/2019 1056) Pt Will Perform Lower Body Dressing: with mod assist; sit to/from stand; sitting/lateral leans Goal: Pt. Will Transfer To Toilet Flowsheets (Taken 03/11/2019 1056) Pt Will Transfer to Toilet: with min assist; ambulating; bedside commode Goal: Pt. Will Perform Toileting-Clothing Manipulation Flowsheets (Taken 03/11/2019 1056) Pt Will Perform Toileting - Clothing Manipulation and hygiene: with supervision; sitting/lateral leans; sit to/from stand

## 2019-03-11 NOTE — Anesthesia Postprocedure Evaluation (Signed)
Anesthesia Post Note  Patient: Phillip Taylor  Procedure(s) Performed: INTRAMEDULLARY (IM) NAIL INTERTROCHANTRIC (Right )  Patient location during evaluation: Nursing Unit Anesthesia Type: General Level of consciousness: awake and alert and oriented Pain management: pain level controlled Vital Signs Assessment: post-procedure vital signs reviewed and stable Respiratory status: spontaneous breathing Cardiovascular status: stable : Some nausea over weekend; IVmed given; better now. Anesthetic complications: no     Last Vitals:  Vitals:   03/10/19 2112 03/11/19 0550  BP: 115/64 (!) 120/53  Pulse: 88 86  Resp: 16 20  Temp: 37.1 C 37.1 C  SpO2: 95% 95%    Last Pain:  Vitals:   03/11/19 0550  TempSrc: Oral  PainSc:                  ADAMS, AMY A

## 2019-03-11 NOTE — Care Management Important Message (Signed)
Important Message  Patient Details  Name: Phillip Taylor MRN: [id number] Date of Birth: 1925-03-06   Medicare Important Message Given:  Yes    [name] 03/11/2019, 12:35 PM

## 2019-03-11 NOTE — Progress Notes (Signed)
Patient ID: Phillip Taylor, male   DOB: April 16, 1925, 85 y.o.   MRN: [id number] Note for March 10, 2019 done on March 11, 2019  Patient's vital signs were stable.  He was awake and alert he was resting in bed.  I changed his dressing as it had soaked completely through.  I placed cotton balls to absorb any further drainage after observing it for several seconds to see if any drainage was seen there was not  New dressing was applied  The patient was to start physical therapy weightbearing as tolerated.  Hemoglobin was 10

## 2019-03-11 NOTE — Progress Notes (Signed)
Patient ID: Phillip Taylor, male   DOB: 06-24-1925, 85 y.o.   MRN: [id number] Postoperative day 2 status post intramedullary nailing right hip for inotrope fracture  Postoperative plan Weightbearing status as tolerated DVT prophylaxis for 35 days with Lovenox There are no sutures to take out the first postop appointment can be made 4 weeks after discharge unless there are any issues No hip precautions are needed   CBC Latest Ref Rng & Units 03/11/2019 03/10/2019 03/09/2019  WBC 4.0 - 10.5 K/uL 7.9 8.8 10.5  Hemoglobin 13.0 - 17.0 g/dL 10.0(L) 10.3(L) 13.2  Hematocrit 39.0 - 52.0 % 30.4(L) 31.4(L) 38.9(L)  Platelets 150 - 400 K/uL 133(L) 123(L) 157    Hemoglobin seems to be stable  Patient should be weightbearing as tolerated

## 2019-03-11 NOTE — Progress Notes (Signed)
Nsg Discharge Note  Admit Date:  03/08/2019 Discharge date: 03/11/2019   Donnelly Angelica to be D/C'd to [facility] per MD order.  AVS completed.  Copy for chart. Report called and given to Imagene Sheller LPN, [facility].    Discharge Medication: Allergies as of 03/11/2019      Reactions   Prevacid [lansoprazole] Other (See Comments)   Does not remeber   Prilosec [omeprazole] Other (See Comments)   Does not remeber   Codeine Other (See Comments)   Does not know   Penicillins Rash      Medication List    STOP taking these medications   triamterene-hydrochlorothiazide 37.5-25 MG tablet Commonly known as:  MAXZIDE-25     TAKE these medications   carvedilol 3.125 MG tablet Commonly known as:  COREG Take 1 tablet (3.125 mg total) by mouth 2 (two) times daily with a meal for 30 days.   docusate sodium 100 MG capsule Commonly known as:  COLACE Take 1 capsule (100 mg total) by mouth 2 (two) times daily.   enoxaparin 40 MG/0.4ML injection Commonly known as:  LOVENOX Inject 0.4 mLs (40 mg total) into the skin daily. Start taking on:  March 12, 2019   HYDROcodone-acetaminophen 5-325 MG tablet Commonly known as:  NORCO/VICODIN Take 1 tablet by mouth every 4 (four) hours as needed for up to 10 days for moderate pain.   levothyroxine 75 MCG tablet Commonly known as:  Synthroid Take 1 tablet (75 mcg total) by mouth daily for 30 days. Start taking on:  March 12, 2019 What changed:  Another medication with the same name was removed. Continue taking this medication, and follow the directions you see here.       Discharge Assessment: Vitals:   03/10/19 2112 03/11/19 0550  BP: 115/64 (!) 120/53  Pulse: 88 86  Resp: 16 20  Temp: 98.8 F (37.1 C) 98.7 F (37.1 C)  SpO2: 95% 95%   Skin clean, dry and intact without evidence of skin break down, no evidence of skin tears noted. IV catheter discontinued intact. Site without signs and symptoms of complications - no redness  or edema noted at insertion site, patient denies c/o pain - only slight tenderness at site.  Dressing with slight pressure applied.  D/c Instructions-Education: Report given to Delphina Tembo. [place]. Alll questions were answered and no further questions at this time. Patient in stable condition and in no acute distress at this time. Patient will be escorted to [facility] via [facility] staff member via wheelchair.  Passion Lavin C, RN 03/11/2019 11:59 AM

## 2019-03-11 NOTE — TOC Transition Note (Signed)
Transition of Care [facility]) - CM/SW Discharge Note   Patient Details  Name: [name] MRN: [id number] Date of Birth: 1924/11/25  Transition of Care [facility]) CM/SW Contact:  Shade Flood, LCSW Phone Number: 03/11/2019, 11:45 AM   Clinical Narrative:     Pt accepted at [facility] and can transfer today. Updated pt's son by phone. Pt will go to room 160. Phone number for report is [phone]. DC clinical sent electronically. Updated pt's RN. There are no other TOC needs for dc.  Final next level of care: Skilled Nursing Facility Barriers to Discharge: Barriers Resolved   Patient Goals and CMS Choice Patient states their goals for this hospitalization and ongoing recovery are:: rehab and return home CMS Medicare.gov Compare Post Acute Care list provided to:: Patient Represenative (must comment) Choice offered to / list presented to : Adult Children  Discharge Placement PASRR number recieved: 03/11/19            Patient chooses bed at: [facility] Patient to be transferred to facility by: wheelchair Name of family member notified: [name] Patient and family notified of of transfer: 03/11/19  Discharge Plan and Services In-house Referral: Clinical Social Work   Post Acute Care Choice: [place]                               Social Determinants of Health (SDOH) Interventions     Readmission Risk Interventions No flowsheet data found.

## 2019-03-11 NOTE — Discharge Summary (Addendum)
Physician Discharge Summary  Phillip Taylor BTD:[id number] DOB: August 15, 1925 DOA: 03/08/2019  PCP: Phillip Blitz, MD  Admit date: 03/08/2019  Discharge date: 03/11/2019  Admitted From:Home  Disposition:  SNF/Rehab  Recommendations for Outpatient Follow-up:  1. Follow up with PCP in 1-2 weeks 2. Follow-up with Dr. Aline Brochure of orthopedics in 4 weeks 3. Remain on Lovenox as prescribed for the next 35 days 4. Norco given as needed for severe pain  Home Health: None  Equipment/Devices: None  Discharge Condition: Stable  CODE STATUS: Full  Diet recommendation: Dysphagia 3  Brief/Interim Summary: Per HPI: Phillip Taylor a85 y.o.male,w hypertension, carotid artery stenosis (R 60-90%, L 40-59% 2018), carotid sinus hypersensitivity s/p SJM DC-PPM implantation, h/o carcinoma of the larynx s/p resection and XRT 1995, Gerd, h/o carcinoma insitu esophagus, BPH s/p TURP x2, hypothyroidism, osteoporosis, apparently had fall w subsequent right hip pain. Pt was putting on his pajamas near the bed when he fell according to his son. No syncope. Pt denies cp, palp, sob, n/v, abd pain, diarrhea, brbpr, dysuria. Pt's son says that the patient has some underlying dementia.  Patient was admitted with right intertrochanteric fracture secondary to mechanical fall and also has some mild AKI secondary to dehydration and multiple medical issues.  Patient is doing well on the morning of 6/7 with pain that is well controlled after ORIF of the right hip on 6/6.  Kidney function appears to be improving as well.  Physical therapy has seen patient on 6/7 and has recommended SNF/rehab on discharge.  He is otherwise stable and doing well with good pain control noted.  No other acute events noted during the course of this admission.   Discharge Diagnoses:  Principal Problem:   Intertrochanteric fracture of right femur, closed, initial encounter [facility]) Active Problems:   Dehydration   Osteoporosis   Essential  hypertension   Hypothyroidism   Intertrochanteric fracture (HCC)  Principal discharge diagnosis: Right intertrochanteric femur fracture status post mechanical fall status post ORIF with gamma nail POD #2.  Discharge Instructions  Discharge Instructions    Diet - low sodium heart healthy   Complete by:  As directed    Increase activity slowly   Complete by:  As directed      Allergies as of 03/11/2019      Reactions   Prevacid [lansoprazole] Other (See Comments)   Does not remeber   Prilosec [omeprazole] Other (See Comments)   Does not remeber   Codeine Other (See Comments)   Does not know   Penicillins Rash      Medication List    STOP taking these medications   triamterene-hydrochlorothiazide 37.5-25 MG tablet Commonly known as:  MAXZIDE-25     TAKE these medications   carvedilol 3.125 MG tablet Commonly known as:  COREG Take 1 tablet (3.125 mg total) by mouth 2 (two) times daily with a meal for 30 days.   docusate sodium 100 MG capsule Commonly known as:  COLACE Take 1 capsule (100 mg total) by mouth 2 (two) times daily.   enoxaparin 40 MG/0.4ML injection Commonly known as:  LOVENOX Inject 0.4 mLs (40 mg total) into the skin daily. Start taking on:  March 12, 2019   HYDROcodone-acetaminophen 5-325 MG tablet Commonly known as:  NORCO/VICODIN Take 1 tablet by mouth every 4 (four) hours as needed for up to 10 days for moderate pain.   levothyroxine 75 MCG tablet Commonly known as:  Synthroid Take 1 tablet (75 mcg total) by mouth daily for 30 days. Start taking  on:  March 12, 2019 What changed:  Another medication with the same name was removed. Continue taking this medication, and follow the directions you see here.      Follow-up Information    Phillip Blitz, MD Follow up in 2 week(s).   Specialty:  Internal Medicine Contact information: [place] [address] [phone]        Carole Civil, MD Follow up in 4 week(s).   Specialties:   Orthopedic Surgery, Radiology Contact information: [address] [place] [address] [phone]          Allergies  Allergen Reactions  . Prevacid [Lansoprazole] Other (See Comments)    Does not remeber  . Prilosec [Omeprazole] Other (See Comments)    Does not remeber  . Codeine Other (See Comments)    Does not know   . Penicillins Rash    Consultations:  Orthopedics   Procedures/Studies: Dg Chest 1 View  Result Date: 03/09/2019 CLINICAL DATA:  Fall EXAM: CHEST  1 VIEW COMPARISON:  None. FINDINGS: The heart size and mediastinal contours are within normal limits. Both lungs are clear. The visualized skeletal structures are unremarkable. Left chest wall pacemaker. IMPRESSION: No active disease. Electronically Signed   By: Ulyses Jarred M.D.   On: 03/09/2019 01:54   Dg Hip Operative Unilat With Pelvis Right  Result Date: 03/09/2019 CLINICAL DATA:  Right hip fracture. EXAM: OPERATIVE RIGHT HIP TECHNIQUE: Fluoroscopic spot image(s) were submitted for interpretation post-operatively. COMPARISON:  03/09/2019 FINDINGS: Fluoroscopic images were obtained for internal fixation of the right intertrochanteric femur fracture. Placement of intramedullary nail in the proximal right femur with a hip screw extending into the right femoral head and neck. IMPRESSION: Internal fixation of the right femur fracture. Electronically Signed   By: Markus Daft M.D.   On: 03/09/2019 17:12   Dg Hip Unilat With Pelvis 2-3 Views Right  Result Date: 03/09/2019 CLINICAL DATA:  85 year old male with fall and right hip fracture. EXAM: DG HIP (WITH OR WITHOUT PELVIS) 2-3V RIGHT COMPARISON:  None. FINDINGS: There is a comminuted intertrochanteric fracture of the right femoral neck with mild valgus angulation. There is no dislocation. The bones are osteopenic. A hernia repair mesh is noted. The soft tissues are unremarkable. IMPRESSION: Comminuted intertrochanteric fracture of the right femoral neck.  Electronically Signed   By: Anner Crete M.D.   On: 03/09/2019 01:55     Subjective:   Discharge Exam: Vitals:   03/10/19 2112 03/11/19 0550  BP: 115/64 (!) 120/53  Pulse: 88 86  Resp: 16 20  Temp: 98.8 F (37.1 C) 98.7 F (37.1 C)  SpO2: 95% 95%   Vitals:   03/10/19 0816 03/10/19 1715 03/10/19 2112 03/11/19 0550  BP: 118/66 134/71 115/64 (!) 120/53  Pulse: 89 93 88 86  Resp: 20 15 16 20   Temp: (!) 97.5 F (36.4 C)  98.8 F (37.1 C) 98.7 F (37.1 C)  TempSrc:   Oral Oral  SpO2: 95%  95% 95%  Weight:      Height:        General: Pt is alert, awake, not in acute distress Cardiovascular: RRR, S1/S2 +, no rubs, no gallops Respiratory: CTA bilaterally, no wheezing, no rhonchi Abdominal: Soft, NT, ND, bowel sounds + Extremities: no edema, no cyanosis    The results of significant diagnostics from this hospitalization (including imaging, microbiology, ancillary and laboratory) are listed below for reference.     Microbiology: Recent Results (from the past 240 hour(s))  SARS Coronavirus 2 (CEPHEID - Performed in [place] hospital lab), Hosp Order     Status: None   Collection Time: 03/09/19  1:26 AM  Result Value Ref Range Status   SARS Coronavirus 2 NEGATIVE NEGATIVE Final    Comment: (NOTE) If result is NEGATIVE SARS-CoV-2 target nucleic acids are NOT DETECTED. The SARS-CoV-2 RNA is generally detectable in upper and lower  respiratory specimens during the acute phase of infection. The lowest  concentration of SARS-CoV-2 viral copies this assay can detect is 250  copies / mL. A negative result does not preclude SARS-CoV-2 infection  and should not be used as the sole basis for treatment or other  patient management decisions.  A negative result may occur with  improper specimen collection / handling, submission of specimen other  than nasopharyngeal swab, presence of viral mutation(s) within the  areas targeted by this assay, and inadequate number of viral  copies  (<250 copies / mL). A negative result must be combined with clinical  observations, patient history, and epidemiological information. If result is POSITIVE SARS-CoV-2 target nucleic acids are DETECTED. The SARS-CoV-2 RNA is generally detectable in upper and lower  respiratory specimens dur ing the acute phase of infection.  Positive  results are indicative of active infection with SARS-CoV-2.  Clinical  correlation with patient history and other diagnostic information is  necessary to determine patient infection status.  Positive results do  not rule out bacterial infection or co-infection with other viruses. If result is PRESUMPTIVE POSTIVE SARS-CoV-2 nucleic acids MAY BE PRESENT.   A presumptive positive result was obtained on the submitted specimen  and confirmed on repeat testing.  While 2019 novel coronavirus  (SARS-CoV-2) nucleic acids may be present in the submitted sample  additional confirmatory testing may be necessary for epidemiological  and / or clinical management purposes  to differentiate between  SARS-CoV-2 and other Sarbecovirus currently known to infect humans.  If clinically indicated additional testing with an alternate test  methodology [phone]) is advised. The SARS-CoV-2 RNA is generally  detectable in upper and lower respiratory sp ecimens during the acute  phase of infection. The expected result is Negative. Fact Sheet for Patients:  StrictlyIdeas.no Fact Sheet for Healthcare Providers: BankingDealers.co.za This test is not yet approved or cleared by the [place] FDA and has been authorized for detection and/or diagnosis of SARS-CoV-2 by FDA under an Emergency Use Authorization (EUA).  This EUA will remain in effect (meaning this test can be used) for the duration of the COVID-19 declaration under Section 564(b)(1) of the Act, 21 U.S.C. section 360bbb-3(b)(1), unless the authorization is terminated  or revoked sooner. Performed at [facility], [address], [place], [place] [address]   Surgical pcr screen     Status: None   Collection Time: 03/09/19 12:00 PM  Result Value Ref Range Status   MRSA, PCR NEGATIVE NEGATIVE Final   Staphylococcus aureus NEGATIVE NEGATIVE Final    Comment: (NOTE) The Xpert SA Assay (FDA approved for NASAL specimens in patients 17 years of age and older), is one component of a comprehensive surveillance program. It is not intended to diagnose infection nor to guide or monitor treatment. Performed at [facility], [address] [place], [place] [address]      Labs: BNP (last 3 results) No results for input(s): BNP in the last 8760 hours. Basic Metabolic Panel: Recent Labs  Lab 03/08/19 2358 03/09/19 0629 03/10/19 0706  NA 139 141 137  K 3.5 3.7 3.5  CL 105 105 105  CO2 23 23 22   GLUCOSE 188* 168* 130*  BUN 28* 27* 22  CREATININE 1.46* 1.21 1.14  CALCIUM 8.7* 8.6* 7.4*   Liver Function Tests: Recent Labs  Lab 03/09/19 0629  AST 35  ALT 24  ALKPHOS 82  BILITOT 1.1  PROT 6.8  ALBUMIN 3.7   No results for input(s): LIPASE, AMYLASE in the last 168 hours. No results for input(s): AMMONIA in the last 168 hours. CBC: Recent Labs  Lab 03/08/19 2358 03/09/19 0629 03/10/19 0706 03/11/19 0602  WBC 12.6* 10.5 8.8 7.9  NEUTROABS 10.8*  --   --   --   HGB 14.6 13.2 10.3* 10.0*  HCT 44.8 38.9* 31.4* 30.4*  MCV 100.7* 99.7 100.3* 100.7*  PLT 173 157 123* 133*   Cardiac Enzymes: Recent Labs  Lab 03/09/19 0323  TROPONINI <0.03   BNP: Invalid input(s): POCBNP CBG: No results for input(s): GLUCAP in the last 168 hours. D-Dimer No results for input(s): DDIMER in the last 72 hours. Hgb A1c Recent Labs    03/09/19 0629  HGBA1C 5.3   Lipid Profile No results for input(s): CHOL, HDL, LDLCALC, TRIG, CHOLHDL, LDLDIRECT in the last 72 hours. Thyroid function studies No results for input(s): TSH, T4TOTAL, T3FREE, THYROIDAB in the  last 72 hours.  Invalid input(s): FREET3 Anemia work up No results for input(s): VITAMINB12, FOLATE, FERRITIN, TIBC, IRON, RETICCTPCT in the last 72 hours. Urinalysis No results found for: COLORURINE, APPEARANCEUR, [place], [place], [place], [place], [place], [place], PROTEINUR, UROBILINOGEN, NITRITE, LEUKOCYTESUR Sepsis Labs Invalid input(s): PROCALCITONIN,  WBC,  LACTICIDVEN Microbiology Recent Results (from the past 240 hour(s))  SARS Coronavirus 2 (CEPHEID - Performed in [place] hospital lab), Hosp Order     Status: None   Collection Time: 03/09/19  1:26 AM  Result Value Ref Range Status   SARS Coronavirus 2 NEGATIVE NEGATIVE Final    Comment: (NOTE) If result is NEGATIVE SARS-CoV-2 target nucleic acids are NOT DETECTED. The SARS-CoV-2 RNA is generally detectable in upper and lower  respiratory specimens during the acute phase of infection. The lowest  concentration of SARS-CoV-2 viral copies this assay can detect is 250  copies / mL. A negative result does not preclude SARS-CoV-2 infection  and should not be used as the sole basis for treatment or other  patient management decisions.  A negative result may occur with  improper specimen collection / handling, submission of specimen other  than nasopharyngeal swab, presence of viral mutation(s) within the  areas targeted by this assay, and inadequate number of viral copies  (<250 copies / mL). A negative result must be combined with clinical  observations, patient history, and epidemiological information. If result is POSITIVE SARS-CoV-2 target nucleic acids are DETECTED. The SARS-CoV-2 RNA is generally detectable in upper and lower  respiratory specimens dur ing the acute phase of infection.  Positive  results are indicative of active infection with SARS-CoV-2.  Clinical  correlation with patient history and other diagnostic information is  necessary to determine patient infection status.  Positive results do  not  rule out bacterial infection or co-infection with other viruses. If result is PRESUMPTIVE POSTIVE SARS-CoV-2 nucleic acids MAY BE PRESENT.   A presumptive positive result was obtained on the submitted specimen  and confirmed on repeat testing.  While 2019 novel coronavirus  (SARS-CoV-2) nucleic acids may be present in the submitted sample  additional confirmatory testing may be necessary for epidemiological  and / or clinical management purposes  to  differentiate between  SARS-CoV-2 and other Sarbecovirus currently known to infect humans.  If clinically indicated additional testing with an alternate test  methodology [phone]) is advised. The SARS-CoV-2 RNA is generally  detectable in upper and lower respiratory sp ecimens during the acute  phase of infection. The expected result is Negative. Fact Sheet for Patients:  StrictlyIdeas.no Fact Sheet for Healthcare Providers: BankingDealers.co.za This test is not yet approved or cleared by the [place] FDA and has been authorized for detection and/or diagnosis of SARS-CoV-2 by FDA under an Emergency Use Authorization (EUA).  This EUA will remain in effect (meaning this test can be used) for the duration of the COVID-19 declaration under Section 564(b)(1) of the Act, 21 U.S.C. section 360bbb-3(b)(1), unless the authorization is terminated or revoked sooner. Performed at [facility], [address] [place], [place] [address]   Surgical pcr screen     Status: None   Collection Time: 03/09/19 12:00 PM  Result Value Ref Range Status   MRSA, PCR NEGATIVE NEGATIVE Final   Staphylococcus aureus NEGATIVE NEGATIVE Final    Comment: (NOTE) The Xpert SA Assay (FDA approved for NASAL specimens in patients 21 years of age and older), is one component of a comprehensive surveillance program. It is not intended to diagnose infection nor to guide or monitor treatment. Performed at [facility], [address] [place], [place] [address]      Time coordinating discharge: 35 minutes  SIGNED:   Rodena Goldmann, DO [facility] 03/11/2019, 10:23 AM  If 7PM-7AM, please contact night-coverage [email or web address] Password [email or web address]

## 2019-03-11 NOTE — Progress Notes (Signed)
Physical Therapy Treatment Patient Details Name: Phillip Taylor MRN: [id number] DOB: 10-03-1925 Today's Date: 03/11/2019    History of Present Illness Phillip Taylor  is a 85 y.o. 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  Pt was putting on his pajamas near the bed when he fell according to his son.  No syncope. Pt denies cp, palp, sob, n/v, abd pain, diarrhea, brbpr, dysuria.  Pt's son says that the patient has some underlying dementia.    PT Comments    Patient demonstrates slow labored movement for sitting up at bedside with assistance to move RLE, limited for performing exercises with RLE due to increased pain and required repeated verbal/tactile cueing to safely complete sit to stands and transfers.  Patient demonstrates increased endurance for taking steps, but limited to bedside due to poor standing balance with increased fall risk.  Patient tolerated sitting up in chair for approximately 2 hours before requesting to go back to bed.  Patient will benefit from continued physical therapy in hospital and recommended venue below to increase strength, balance, endurance for safe ADLs and gait.    Follow Up Recommendations  SNF     Equipment Recommendations  None recommended by PT    Recommendations for Other Services       Precautions / Restrictions Precautions Precautions: Fall Restrictions Weight Bearing Restrictions: Yes RLE Weight Bearing: Weight bearing as tolerated    Mobility  Bed Mobility Overal bed mobility: Needs Assistance Bed Mobility: Supine to Sit;Sit to Supine     Supine to sit: Max assist Sit to supine: Mod assist;Max assist   General bed mobility comments: slow labored movement, difficulty  moving RLE secondary to pain  Transfers Overall transfer level: Needs assistance Equipment used: Rolling walker (2 wheeled) Transfers: Sit to/from [facility] Sit to Stand: Mod assist;Max assist Stand pivot transfers: Mod assist;Max assist       General transfer comment: requires repeated verbal/tactile cueing for proper hand/foot placement during sit to stands/transfers  Ambulation/Gait Ambulation/Gait assistance: Mod assist Gait Distance (Feet): 4 Feet Assistive device: Rolling walker (2 wheeled) Gait Pattern/deviations: Decreased step length - right;Decreased step length - left;Decreased stance time - left;Decreased stride length Gait velocity: slow   General Gait Details: limited to 5-6 slow unsteady side steps with difficulty advancing RLE due to pain/weakness   Stairs             Wheelchair Mobility    Modified Rankin (Stroke Patients Only)       Balance Overall balance assessment: Needs assistance Sitting-balance support: Feet supported;Bilateral upper extremity supported Sitting balance-Leahy Scale: Fair Sitting balance - Comments: seated at bedside   Standing balance support: Bilateral upper extremity supported;During functional activity Standing balance-Leahy Scale: Poor Standing balance comment: fair/poor with RW                            Cognition Arousal/Alertness: Awake/alert Behavior During Therapy: WFL for tasks assessed/performed Overall Cognitive Status: Within Functional Limits for tasks assessed                                        Exercises Total  Joint Exercises Ankle Circles/Pumps: AROM;Strengthening;10 reps;Supine Heel Slides: Supine;AAROM;Strengthening;Right;5 reps    General Comments        Pertinent Vitals/Pain Pain Assessment: Faces Faces Pain Scale: Hurts even more Pain Location: righ hip with movement Pain Descriptors / Indicators: Aching;Grimacing;Guarding Pain  Intervention(s): Limited activity within patient's tolerance;Monitored during session    Home Living Family/patient expects to be discharged to:: Skilled nursing facility Living Arrangements: Alone Available Help at Discharge: Available PRN/intermittently;Family Type of Home: House Home Access: Stairs to enter Entrance Stairs-Rails: Can reach both Home Layout: One level Home Equipment: Environmental consultant - 2 wheels      Prior Function Level of Independence: Independent      Comments: pt reports independence with all ADLs, IADLs - states he could walk 0.84miles without AD at baseline   PT Goals (current goals can now be found in the care plan section) Acute Rehab PT Goals Patient Stated Goal: go home PT Goal Formulation: With patient Time For Goal Achievement: 03/17/19 Potential to Achieve Goals: Good Progress towards PT goals: Progressing toward goals    Frequency    7X/week      PT Plan Current plan remains appropriate    Co-evaluation PT/OT/SLP Co-Evaluation/Treatment: Yes Reason for Co-Treatment: For patient/therapist safety PT goals addressed during session: Mobility/safety with mobility;Proper use of DME;Strengthening/ROM OT goals addressed during session: ADL's and self-care;Strengthening/ROM      AM-PAC PT "6 Clicks" Mobility   Outcome Measure  Help needed turning from your back to your side while in a flat bed without using bedrails?: A Little Help needed moving from lying on your back to sitting on the side of a flat bed without using bedrails?: A Lot Help needed moving to and from a bed to a chair (including a wheelchair)?: A Lot Help needed standing up from a chair using your arms (e.g., wheelchair or bedside chair)?: A Lot Help needed to walk in hospital room?: A Lot Help needed climbing 3-5 steps with a railing? : Total 6 Click Score: 12    End of Session Equipment Utilized During Treatment: Gait belt Activity Tolerance: Patient tolerated treatment  well;Patient limited by fatigue Patient left: in chair;with call bell/phone within reach;with chair alarm set Nurse Communication: Mobility status PT Visit Diagnosis: Muscle weakness (generalized) (M62.81);Difficulty in walking, not elsewhere classified (R26.2);Unsteadiness on feet (R26.81);Other abnormalities of gait and mobility (R26.89);Pain Pain - Right/Left: Right Pain - part of body: Hip     Time: 9622-2979 PT Time Calculation (min) (ACUTE ONLY): 26 min  Charges:  $Therapeutic Activity: 23-37 mins                     1:25 PM, 03/11/19 Lonell Grandchild, MPT Physical Therapist with [facility] [phone] office [phone] mobile phone

## 2019-03-11 NOTE — TOC Initial Note (Signed)
Transition of Care [facility]) - Initial/Assessment Note    Patient Details  Name: Phillip Taylor MRN: [id number] Date of Birth: 1925/06/23  Transition of Care [facility]) CM/SW Contact:    Shade Flood, LCSW Phone Number: 03/11/2019, 10:43 AM  Clinical Narrative:                  PT recommending SNF. Spoke with pt's son/POA to discuss. He is in agreement. Will refer to requested facilities. Per MD, pt can dc today.   Expected Discharge Plan: Skilled Nursing Facility Barriers to Discharge: Barriers Resolved   Patient Goals and CMS Choice Patient states their goals for this hospitalization and ongoing recovery are:: rehab and return home CMS Medicare.gov Compare Post Acute Care list provided to:: Patient Represenative (must comment) Choice offered to / list presented to : Adult Children  Expected Discharge Plan and Services Expected Discharge Plan: [place] In-house Referral: Clinical Social Work   Post Acute Care Choice: [place] Living arrangements for the past 2 months: [place] Expected Discharge Date: 03/11/19                                    Prior Living Arrangements/Services Living arrangements for the past 2 months: Single Family Home Lives with:: Self Patient language and need for interpreter reviewed:: Yes Do you feel safe going back to the place where you live?: Yes      Need for Family Participation in Patient Care: Yes (Comment) Care giver support system in place?: Yes (comment)   Criminal Activity/Legal Involvement Pertinent to Current Situation/Hospitalization: No - Comment as needed  Activities of Daily Living Home Assistive Devices/Equipment: Eyeglasses, Hearing aid ADL Screening (condition at time of admission) Patient's cognitive ability adequate to safely complete daily activities?: Yes Is the patient deaf or have difficulty hearing?: Yes(Hearing aids at home ) Does the patient have difficulty seeing, even when  wearing glasses/contacts?: No Does the patient have difficulty concentrating, remembering, or making decisions?: No Patient able to express need for assistance with ADLs?: Yes Does the patient have difficulty dressing or bathing?: No Independently performs ADLs?: No Communication: Independent Dressing (OT): Needs assistance Is this a change from baseline?: Change from baseline, expected to last <3days Grooming: Independent Feeding: Independent Bathing: Needs assistance Is this a change from baseline?: Change from baseline, expected to last <3 days Toileting: Needs assistance Is this a change from baseline?: Change from baseline, expected to last <3 days In/Out Bed: Needs assistance Is this a change from baseline?: Change from baseline, expected to last <3 days Walks in Home: Independent Does the patient have difficulty walking or climbing stairs?: Yes(R femur fx) Weakness of Legs: Right Weakness of Arms/Hands: None  Permission Sought/Granted Permission sought to share information with : Facility Art therapist granted to share information with : Yes, Verbal Permission Granted     Permission granted to share info w AGENCY: [facility]        Emotional Assessment Appearance:: Appears stated age Attitude/Demeanor/Rapport: Engaged Affect (typically observed): Calm Orientation: : Oriented to Self, Oriented to Place Alcohol / Substance Use: Not Applicable Psych Involvement: No (comment)  Admission diagnosis:  Pain [R52] Fall [W19.XXXA] Patient Active Problem List   Diagnosis Date Noted  . Intertrochanteric fracture of right femur, closed, initial encounter ([place]) 03/09/2019  . Dehydration 03/09/2019  . Osteoporosis 03/09/2019  . Essential hypertension 03/09/2019  . Hypothyroidism 03/09/2019  . Intertrochanteric fracture (  [place]) 03/09/2019  . Heme positive stool 10/17/2012   PCP:  [name], MD Pharmacy:   [facility], [place] AT [place] [address] [place] [address] Phone: [phone] Fax: [phone]     Social Determinants of Health (SDOH) Interventions    Readmission Risk Interventions No flowsheet data found.

## 2019-03-11 NOTE — Evaluation (Signed)
Occupational Therapy Evaluation Patient Details Name: Phillip Taylor MRN: [id number] DOB: 02/10/1925 Today's Date: 03/11/2019    History of Present Illness Phillip Taylor  is a 85 y.o. 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  Pt was putting on his pajamas near the bed when he fell according to his son.  No syncope. Pt denies cp, palp, sob, n/v, abd pain, diarrhea, brbpr, dysuria.  Pt's son says that the patient has some underlying dementia.   Clinical Impression   Pt in bed upon therapy arrival and agreeable to participate in OT evaluation. Patient presents with anxiety with movement due to right hip pain. Required increased time and max verbal and physical cueing to complete functional mobility tasks. Pt presents with decrease BUE strength and endurance resulting in difficulty completing ADL tasks and functional transfers. Recommend SNF at discharge. Skilled OT will following patient acutely.     Follow Up Recommendations  SNF    Equipment Recommendations  Other (comment)(Defer to next venue of care)       Precautions / Restrictions Precautions Precautions: Fall Restrictions Weight Bearing Restrictions: Yes RLE Weight Bearing: Weight bearing as tolerated      Mobility Bed Mobility Overal bed mobility: Needs Assistance Bed Mobility: Supine to Sit     Supine to sit: Max assist        Transfers Overall transfer level: Needs assistance Equipment used: Rolling walker (2 wheeled) Transfers: Sit to/from [facility] Sit to Stand: Max assist Stand pivot transfers: Max assist            Balance Overall balance assessment: Needs assistance Sitting-balance support: Feet supported Sitting  balance-Leahy Scale: Fair     Standing balance support: Bilateral upper extremity supported;During functional activity Standing balance-Leahy Scale: Zero        ADL either performed or assessed with clinical judgement   ADL Overall ADL's : Needs assistance/impaired Eating/Feeding: Set up;Sitting   Grooming: Wash/dry face;Wash/dry hands;Set up;Sitting   Upper Body Bathing: Set up;Sitting   Lower Body Bathing: Maximal assistance;Bed level   Upper Body Dressing : Set up;Sitting   Lower Body Dressing: Maximal assistance;Bed level   Toilet Transfer: Maximal assistance;RW;Ambulation Toilet Transfer Details (indicate cue type and reason): simulated from bed to recliner         Functional mobility during ADLs: Maximal assistance;Rolling walker       Vision Baseline Vision/History: No visual deficits Patient Visual Report: No change from baseline              Pertinent Vitals/Pain Pain Assessment: Faces Faces Pain Scale: Hurts even more Pain Location: righ hip with movement Pain Descriptors / Indicators: Aching;Grimacing;Guarding Pain Intervention(s): Limited activity within patient's tolerance;Patient requesting pain meds-RN notified;Monitored during session;Repositioned     Hand Dominance Right   Extremity/Trunk Assessment Upper Extremity Assessment Upper Extremity Assessment: Generalized weakness   Lower Extremity Assessment Lower Extremity Assessment: Defer to PT evaluation       Communication Communication Communication: HOH   Cognition Arousal/Alertness: Awake/alert Behavior During Therapy: WFL for tasks assessed/performed Overall Cognitive Status: Within Functional Limits for tasks assessed                  Home Living Family/patient expects to be discharged to:: Skilled nursing facility Living Arrangements: Alone  Available Help at Discharge: Available PRN/intermittently;Family Type of Home: House Home Access: Stairs to enter [employer] of Steps: 1 Entrance Stairs-Rails: Can reach both Home Layout: One level     Bathroom Shower/Tub: Teacher, early years/pre: Standard     Home Equipment: Environmental consultant - 2 wheels          Prior Functioning/Environment Level of Independence: Independent        Comments: pt reports independence with all ADLs, IADLs - states he could walk 0.37miles without AD at baseline        OT Problem List: Decreased strength;Decreased knowledge of use of DME or AE;Decreased activity tolerance;Pain;Impaired balance (sitting and/or standing)      OT Treatment/Interventions: Self-care/ADL training;Modalities;Balance training;Therapeutic exercise;Therapeutic activities;Neuromuscular education;DME and/or AE instruction;Manual therapy;Patient/family education;Energy conservation    OT Goals(Current goals can be found in the care plan section) Acute Rehab OT Goals Patient Stated Goal: go home OT Goal Formulation: With patient Time For Goal Achievement: 03/25/19 Potential to Achieve Goals: Good  OT Frequency: Min 2X/week   Barriers to D/C: Decreased caregiver support  Pt lives alone       Co-evaluation PT/OT/SLP Co-Evaluation/Treatment: Yes Reason for Co-Treatment: To address functional/ADL transfers   OT goals addressed during session: ADL's and self-care;Strengthening/ROM      AM-PAC OT "6 Clicks" Daily Activity     Outcome Measure Help from another person eating meals?: None Help from another person taking care of personal grooming?: A Little Help from another person toileting, which includes using toliet, bedpan, or urinal?: A Lot Help from another person bathing (including washing, rinsing, drying)?: A Lot Help from another person to put on and taking off regular upper body clothing?: A Little Help from another person to put on and taking off regular lower body clothing?: A Lot 6 Click Score: 16   End of Session Equipment Utilized During Treatment: Gait  belt;Rolling walker Nurse Communication: Mobility status;Patient requests pain meds  Activity Tolerance: Patient tolerated treatment well;Patient limited by pain Patient left: in chair;with call bell/phone within reach;with chair alarm set;with nursing/sitter in room  OT Visit Diagnosis: Muscle weakness (generalized) (M62.81)                Time: 5929-2446 OT Time Calculation (min): 30 min Charges:  OT General Charges $OT Visit: 1 Visit OT Evaluation $OT Eval Moderate Complexity: [place], OTR/L,CBIS  [phone]   Gal Feldhaus, Clarene Duke 03/11/2019, 10:54 AM

## 2019-03-12 ENCOUNTER — Non-Acute Institutional Stay (SKILLED_NURSING_FACILITY): Payer: Medicare Other | Admitting: Adult Health

## 2019-03-12 ENCOUNTER — Encounter: Payer: Self-pay | Admitting: Adult Health

## 2019-03-12 ENCOUNTER — Other Ambulatory Visit: Payer: Self-pay | Admitting: Adult Health

## 2019-03-12 DIAGNOSIS — D62 Acute posthemorrhagic anemia: Secondary | ICD-10-CM | POA: Insufficient documentation

## 2019-03-12 DIAGNOSIS — F0390 Unspecified dementia without behavioral disturbance: Secondary | ICD-10-CM | POA: Diagnosis not present

## 2019-03-12 DIAGNOSIS — G9001 Carotid sinus syncope: Secondary | ICD-10-CM

## 2019-03-12 DIAGNOSIS — I6523 Occlusion and stenosis of bilateral carotid arteries: Secondary | ICD-10-CM | POA: Diagnosis not present

## 2019-03-12 DIAGNOSIS — I6529 Occlusion and stenosis of unspecified carotid artery: Secondary | ICD-10-CM | POA: Insufficient documentation

## 2019-03-12 DIAGNOSIS — I10 Essential (primary) hypertension: Secondary | ICD-10-CM | POA: Diagnosis not present

## 2019-03-12 DIAGNOSIS — E039 Hypothyroidism, unspecified: Secondary | ICD-10-CM

## 2019-03-12 DIAGNOSIS — K5909 Other constipation: Secondary | ICD-10-CM | POA: Diagnosis not present

## 2019-03-12 DIAGNOSIS — S72144S Nondisplaced intertrochanteric fracture of right femur, sequela: Secondary | ICD-10-CM | POA: Diagnosis not present

## 2019-03-12 MED ORDER — HYDROCODONE-ACETAMINOPHEN 5-325 MG PO TABS: 1.0000 | ORAL_TABLET | ORAL | 0 refills | Status: AC | PRN

## 2019-03-12 NOTE — Progress Notes (Signed)
Location:   [facility] Room Number: 160 P Place of Service:  SNF (31)   CODE STATUS: Full Code  Allergies  Allergen Reactions  . Prevacid [Lansoprazole] Other (See Comments)    Does not remeber  . Prilosec [Omeprazole] Other (See Comments)    Does not remeber  . Codeine Other (See Comments)    Does not know   . Penicillins Rash    Chief Complaint  Patient presents with  . Hospitalization Follow-up    hospital follow up    HPI:  He is a 85 year old man who has been hospitalized from 03-08-19 through 03-11-19. He had a fall at home and suffered a right hip fracture. He had a nailing performed. He is here for short term rehab with his goal to return back home. He does have right hip pain. He denies any insomnia; no heart burn; no anxiety. He will continue to be followed for his chronic illnesses including: hypertension; hypothyroidism; constipation.   Past Medical History:  Diagnosis Date  . BPH (benign prostatic hyperplasia)   . Carotid sinus hypersensitivity   . Carotid stenosis, asymptomatic, bilateral   . Dementia ([place])   . GERD (gastroesophageal reflux disease)   . Hypertension   . Hypothyroidism   . Osteoporosis   . PONV (postoperative nausea and vomiting)   . Vocal cord cancer (HCC)    at least 13 years ago    Past Surgical History:  Procedure Laterality Date  . COLONOSCOPY  10/25/2012   Procedure: COLONOSCOPY;  Surgeon: Daneil Dolin, MD;  Location: [facility] ENDO SUITE;  Service: Endoscopy;  Laterality: N/A;  1:30  . HERNIA REPAIR    . INTRAMEDULLARY (IM) NAIL INTERTROCHANTERIC Right 03/09/2019   Procedure: INTRAMEDULLARY (IM) NAIL INTERTROCHANTRIC;  Surgeon: Carole Civil, MD;  Location: [facility] ORS;  Service: Orthopedics;  Laterality: Right;  . NISSEN FUNDOPLICATION    . PACEMAKER INSERTION    . TRANSURETHRAL RESECTION OF PROSTATE    . vocal cord cancer removal      Social History   Socioeconomic History  . Marital status: Married     Spouse name: Not on file  . Number of children: Not on file  . Years of education: Not on file  . Highest education level: Not on file  Occupational History  . 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 Food insecurity:    Worry: Not on file    Inability: Not on file  . Transportation needs:    Medical: Not on file    Non-medical: Not on file  Tobacco Use  . Smoking status: Never Smoker  . Smokeless tobacco: Never Used  Substance and Sexual Activity  . Alcohol use: No    Frequency: Never  . Drug use: No  . Sexual activity: Not on file  Lifestyle  . Physical activity:    Days per week: Not on file    Minutes per session: Not on file  . Stress: Not on file  Relationships  . Social connections:    Talks on phone: Not on file    Gets together: Not on file    Attends religious service: Not on file    Active member of club or organization: Not on file    Attends meetings of clubs or organizations: Not on file    Relationship status: Not on file  . Intimate partner violence:  Fear of current or ex partner: Not on file    Emotionally abused: Not on file    Physically abused: Not on file    Forced sexual activity: Not on file  Other Topics Concern  . Not on file  Social History Narrative   ** Merged History Encounter **       Family History  Problem Relation Age of Onset  . Colon cancer Neg Hx       VITAL SIGNS BP 118/60   Pulse 80   Temp 97.8 F (36.6 C)   Resp 20   Ht 5\' 9"  (1.753 m)   Wt 151 lb 14.4 oz (68.9 kg)   BMI 22.43 kg/m   Outpatient Encounter Medications as of 03/12/2019  Medication Sig  . carvedilol (COREG) 3.125 MG tablet Take 1 tablet (3.125 mg total) by mouth 2 (two) times daily with a meal for 30 days.  Marland Kitchen docusate sodium (COLACE) 100 MG capsule Take 1 capsule (100 mg total) by mouth 2 (two) times daily.  Marland Kitchen enoxaparin (LOVENOX) 40 MG/0.4ML injection Inject 0.4 mLs  (40 mg total) into the skin daily.  Marland Kitchen HYDROcodone-acetaminophen (NORCO/VICODIN) 5-325 MG tablet Take 1 tablet by mouth every 4 (four) hours as needed for up to 10 days for moderate pain.  Marland Kitchen levothyroxine (SYNTHROID) 75 MCG tablet Take 1 tablet (75 mcg total) by mouth daily for 30 days.  . NON FORMULARY Diet Type:  Dysphagia 3, Regular Liquids   No facility-administered encounter medications on file as of 03/12/2019.      SIGNIFICANT DIAGNOSTIC EXAMS  TODAY;   03-09-19: right hip x-ray: Comminuted intertrochanteric fracture of the right femoral neck.  03-09-19: chest x-ray: no active disease   LABS REVIEWED TODAY;   03-08-19: wbc 12.6; hgb 14.6; hct 44.8; mcv 100.7 ;plt 173; glucose 188; bun 28; creat 1.46; k+ 3.5; na++ 139; ca 8.7  03-09-19; wbc 10.5; hgb 13.2; hct 38.9; mcv 99.7; plt 157 glucose 168; bun 27; creat 1.21; k+ 3.7; na++ 141; ca 8.6 liver normal albumin 3.7 hgb a1c 5.3  03-10-19: glucose 130; bun 22; creat 1.14; k+ 3.5; an++ 137; ca 7.4  03-11-19: wbc 7.9; hgb 10.0; hct 30.4 mcv 1001.7; plt 133   Review of Systems  Constitutional: Negative for malaise/fatigue.  Respiratory: Negative for cough and shortness of breath.   Cardiovascular: Negative for chest pain, palpitations and leg swelling.  Gastrointestinal: Negative for abdominal pain, constipation and heartburn.  Musculoskeletal: Positive for joint pain. Negative for back pain and myalgias.       Right hip pain   Skin: Negative.   Neurological: Negative for dizziness.  Psychiatric/Behavioral: The patient is not nervous/anxious.     Physical Exam Constitutional:      General: He is not in acute distress.    Appearance: He is well-developed. He is not diaphoretic.  HENT:     Mouth/Throat:     Comments: History of vocal cord cancer removal  Neck:     Musculoskeletal: Neck supple.     Thyroid: No thyromegaly.  Cardiovascular:     Rate and Rhythm: Normal rate and regular rhythm.     Pulses: Normal pulses.     Heart  sounds: Normal heart sounds.     Comments: Pacemaker  Pulmonary:     Effort: Pulmonary effort is normal. No respiratory distress.     Breath sounds: Normal breath sounds.  Abdominal:     General: Bowel sounds are normal. There is no distension.     Palpations:  Abdomen is soft.     Tenderness: There is no abdominal tenderness.  Genitourinary:    Comments: History of TURP  Musculoskeletal:     Right lower leg: No edema.     Left lower leg: No edema.     Comments: Is able to move all extremities Is status post right hip fracture   Lymphadenopathy:     Cervical: No cervical adenopathy.  Skin:    General: Skin is warm and dry.     Comments: Right hip incision lines without signs of infection present  Bilateral lower extremities discolored   Neurological:     Mental Status: He is alert. Mental status is at baseline.  Psychiatric:        Mood and Affect: Mood normal.       ASSESSMENT/ PLAN:  TODAY:   1. Essential hypertension: is stable b/p 118/60: will continue coreg 3.125 mg twice daily   2. Hypothyroidism unspecified type: is stable will continue synthroid 75 mcg daily   3. Chronic constipation: is stable will continue colace twice daily   4. Closed nondisplaced intertrochanteric fracture of right femur, sequela: is stable will continue therapy as directed will follow up with orthopedics. Will continue lovenox 40 mg daily for 35 days then asa 81 mg daily has vicodin 5/325 mg every 4 hours as needed for pain will monitor   5. Carotid artery stenosis bilateral  (R 60-90%; L 40-59% 2018) is stable will monitor   6. Carotid sinus hypersensitivity: is status post pace maker insertion will monitor   7. Carcinoma of larynx: status post resection and radiation therapy 1995. Will monitor   8. GERD without esophagitis is stable will monitor   9. Hypocalcemia: is without change ca 7.4 will begin tums 750 mg twice daily   10. Acute blood loss anemia: hgb 10.0; will monitor    11. Dementia without behavioral disturbance; is without change: weight is 151 pounds will monitor     MD is aware of resident's narcotic use and is in agreement with current plan of care. We will attempt to wean resident as apropriate   [name] NP [facility]  Contact [phone] Monday through Friday 8am- 5pm  After hours call [phone]

## 2019-03-13 ENCOUNTER — Non-Acute Institutional Stay (SKILLED_NURSING_FACILITY): Payer: Medicare Other | Admitting: Adult Health

## 2019-03-13 ENCOUNTER — Encounter: Payer: Self-pay | Admitting: Adult Health

## 2019-03-13 DIAGNOSIS — I10 Essential (primary) hypertension: Secondary | ICD-10-CM

## 2019-03-13 DIAGNOSIS — S72144S Nondisplaced intertrochanteric fracture of right femur, sequela: Secondary | ICD-10-CM

## 2019-03-13 DIAGNOSIS — F0390 Unspecified dementia without behavioral disturbance: Secondary | ICD-10-CM | POA: Diagnosis not present

## 2019-03-13 LAB — TYPE AND SCREEN
ABO/RH(D): A POS
Antibody Screen: NEGATIVE
Unit division: 0
Unit division: 0

## 2019-03-13 LAB — BPAM RBC
Blood Product Expiration Date: 202006292359
Blood Product Expiration Date: 202006292359
Unit Type and Rh: 6200
Unit Type and Rh: 6200

## 2019-03-13 NOTE — Progress Notes (Signed)
Location:   [place] Room Number: 160 P Place of Service:  SNF (31)   CODE STATUS: Full Code  Allergies  Allergen Reactions  . Prevacid [Lansoprazole] Other (See Comments)    Does not remeber  . Prilosec [Omeprazole] Other (See Comments)    Does not remeber  . Codeine Other (See Comments)    Does not know   . Penicillins Rash    Chief Complaint  Patient presents with  . Acute Visit    72 Hour Care Plan Meeting    HPI:  We have come together for his 58 hour care plan meeting. His goal is to return back home. His family is his neighbors. He has a walker at home. He doe shave a fear of falling; which right now does inhibit his ability to participate fully in therapy. He denies any pain; he states that he has a good appetite. He denies any insomnia. There are no reports of fevers present.   Past Medical History:  Diagnosis Date  . BPH (benign prostatic hyperplasia)   . Carotid sinus hypersensitivity   . Carotid stenosis, asymptomatic, bilateral   . Dementia ([place])   . GERD (gastroesophageal reflux disease)   . Hypertension   . Hypothyroidism   . Osteoporosis   . PONV (postoperative nausea and vomiting)   . Vocal cord cancer (HCC)    at least 13 years ago    Past Surgical History:  Procedure Laterality Date  . COLONOSCOPY  10/25/2012   Procedure: COLONOSCOPY;  Surgeon: Daneil Dolin, MD;  [facility];  Service: Endoscopy;  Laterality: N/A;  1:30  . HERNIA REPAIR    . INTRAMEDULLARY (IM) NAIL INTERTROCHANTERIC Right 03/09/2019   Procedure: INTRAMEDULLARY (IM) NAIL INTERTROCHANTRIC;  Surgeon: Carole Civil, MD;  Location: [facility];  Service: Orthopedics;  Laterality: Right;  . NISSEN FUNDOPLICATION    . PACEMAKER INSERTION    . TRANSURETHRAL RESECTION OF PROSTATE    . vocal cord cancer removal      Social History   Socioeconomic History  . Marital status: Married    Spouse name: Not on file  . Number of children: Not  on file  . Years of education: Not on file  . Highest education level: Not on file  Occupational History  . 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 Food insecurity:    Worry: Not on file    Inability: Not on file  . Transportation needs:    Medical: Not on file    Non-medical: Not on file  Tobacco Use  . Smoking status: Never Smoker  . Smokeless tobacco: Never Used  Substance and Sexual Activity  . Alcohol use: No    Frequency: Never  . Drug use: No  . Sexual activity: Not on file  Lifestyle  . Physical activity:    Days per week: Not on file    Minutes per session: Not on file  . Stress: Not on file  Relationships  . Social connections:    Talks on phone: Not on file    Gets together: Not on file    Attends religious service: Not on file    Active member of club or organization: Not on file    Attends meetings of clubs or organizations: Not on file    Relationship status: Not on file  . Intimate partner violence:  Fear of current or ex partner: Not on file    Emotionally abused: Not on file    Physically abused: Not on file    Forced sexual activity: Not on file  Other Topics Concern  . Not on file  Social History Narrative   ** Merged History Encounter **       Family History  Problem Relation Age of Onset  . Colon cancer Neg Hx       VITAL SIGNS BP 136/76   Pulse 84   Temp 98.4 F (36.9 C)   Resp 18   Ht 6' (1.829 m)   Wt 149 lb (67.6 kg)   BMI 20.21 kg/m   Outpatient Encounter Medications as of 03/13/2019  Medication Sig  . calcium carbonate (TUMS EX) 750 MG chewable tablet Chew 1 tablet by mouth 2 (two) times daily.  . carvedilol (COREG) 3.125 MG tablet Take 1 tablet (3.125 mg total) by mouth 2 (two) times daily with a meal for 30 days.  Marland Kitchen docusate sodium (COLACE) 100 MG capsule Take 1 capsule (100 mg total) by mouth 2 (two) times daily.  Marland Kitchen enoxaparin (LOVENOX) 40  MG/0.4ML injection Inject 0.4 mLs (40 mg total) into the skin daily.  Marland Kitchen HYDROcodone-acetaminophen (NORCO/VICODIN) 5-325 MG tablet Take 1 tablet by mouth every 4 (four) hours as needed for up to 7 days for moderate pain.  Marland Kitchen levothyroxine (SYNTHROID) 75 MCG tablet Take 1 tablet (75 mcg total) by mouth daily for 30 days.  . NON FORMULARY Diet Type:  Dysphagia 3, Regular Liquids   No facility-administered encounter medications on file as of 03/13/2019.      SIGNIFICANT DIAGNOSTIC EXAMS  PREVIOUS;   03-09-19: right hip x-ray: Comminuted intertrochanteric fracture of the right femoral neck.  03-09-19: chest x-ray: no active disease   NO NEW EXAMS   LABS REVIEWED PREVIOUS;   03-08-19: wbc 12.6; hgb 14.6; hct 44.8; mcv 100.7 ;plt 173; glucose 188; bun 28; creat 1.46; k+ 3.5; na++ 139; ca 8.7  03-09-19; wbc 10.5; hgb 13.2; hct 38.9; mcv 99.7; plt 157 glucose 168; bun 27; creat 1.21; k+ 3.7; na++ 141; ca 8.6 liver normal albumin 3.7 hgb a1c 5.3  03-10-19: glucose 130; bun 22; creat 1.14; k+ 3.5; an++ 137; ca 7.4  03-11-19: wbc 7.9; hgb 10.0; hct 30.4 mcv 1001.7; plt 133  NO NEW LABS.   Review of Systems  Constitutional: Negative for malaise/fatigue.  Respiratory: Negative for cough and shortness of breath.   Cardiovascular: Negative for chest pain, palpitations and leg swelling.  Gastrointestinal: Negative for abdominal pain, constipation and heartburn.  Musculoskeletal: Negative for back pain, joint pain and myalgias.  Skin: Negative.   Neurological: Negative for dizziness.  Psychiatric/Behavioral: The patient is not nervous/anxious.     Physical Exam Constitutional:      General: He is not in acute distress.    Appearance: He is well-developed. He is not diaphoretic.  HENT:     Mouth/Throat:     Comments: History of vocal cord cancer removal  Neck:     Musculoskeletal: Neck supple.     Thyroid: No thyromegaly.  Cardiovascular:     Rate and Rhythm: Normal rate and regular rhythm.      Pulses: Normal pulses.     Heart sounds: Normal heart sounds.     Comments: Psychologist, forensic present  Pulmonary:     Effort: Pulmonary effort is normal. No respiratory distress.     Breath sounds: Normal breath sounds.  Abdominal:  General: Bowel sounds are normal. There is no distension.     Palpations: Abdomen is soft.     Tenderness: There is no abdominal tenderness.  Genitourinary:    Comments: History of turp Musculoskeletal:     Right lower leg: No edema.     Left lower leg: No edema.     Comments: Is able to move all extremities Is status post right hip fracture    Lymphadenopathy:     Cervical: No cervical adenopathy.  Skin:    General: Skin is warm and dry.     Comments: Right hip incision lines without signs of infection present  Bilateral lower extremities discolored    Neurological:     Mental Status: He is alert. Mental status is at baseline.  Psychiatric:        Mood and Affect: Mood normal.     ASSESSMENT/ PLAN:  TODAY:   1. Dementia without behavioral disturbance, unspecified dementia type 2. Closed nondisplaced intertrochanteric fracture of right femur sequela 3. Essential hypertension  Will continue current plan of care Will continue therapy as directed and will follow up with orthopedics Will continue to monitor his status.       MD is aware of resident's narcotic use and is in agreement with current plan of care. We will attempt to wean resident as apropriate   [name] NP [facility] Adult Medicine  Contact [phone] Monday through Friday 8am- 5pm  After hours call [phone]

## 2019-03-14 ENCOUNTER — Encounter: Payer: Self-pay | Admitting: Internal Medicine

## 2019-03-14 ENCOUNTER — Non-Acute Institutional Stay (SKILLED_NURSING_FACILITY): Payer: Medicare Other | Admitting: Internal Medicine

## 2019-03-14 DIAGNOSIS — I10 Essential (primary) hypertension: Secondary | ICD-10-CM

## 2019-03-14 DIAGNOSIS — F0390 Unspecified dementia without behavioral disturbance: Secondary | ICD-10-CM

## 2019-03-14 DIAGNOSIS — D62 Acute posthemorrhagic anemia: Secondary | ICD-10-CM

## 2019-03-14 DIAGNOSIS — E039 Hypothyroidism, unspecified: Secondary | ICD-10-CM

## 2019-03-14 DIAGNOSIS — M8000XS Age-related osteoporosis with current pathological fracture, unspecified site, sequela: Secondary | ICD-10-CM

## 2019-03-14 DIAGNOSIS — S72141A Displaced intertrochanteric fracture of right femur, initial encounter for closed fracture: Secondary | ICD-10-CM

## 2019-03-14 NOTE — Progress Notes (Signed)
Provider:  Veleta Miners, MD Location:  [place] Room Number: 160 P Place of Service:  SNF (31)  PCP: [name], MD Patient Care Team: [name], MD as PCP - General (Internal Medicine) Gala Romney Cristopher Estimable, MD as Attending Physician (Gastroenterology)  Extended Emergency Contact Information Primary Emergency Contact: Eagen,Greg Mobile Phone: [phone] Relation: Son  Code Status: Full Code Goals of Care: Advanced Directive information Advanced Directives 03/14/2019  Does Patient Have a Medical Advance Directive? No  Type of Advance Directive -  Does patient want to make changes to medical advance directive? No - Patient declined  Copy of [place] in Chart? -  Would patient like information on creating a medical advance directive? No - Patient declined      Chief Complaint  Patient presents with  . New Admit To SNF    Admission    HPI: Patient is a 85 y.o. male seen today for admission to SNF for therapy. Patient was in [facility] from 6/5-6/8 for Right Intertrochanteric Fracture. He has history of Hypertension, Carotid artery stenosis, status post PPM, history of carcinoma of larynx, history of carcinoma in situ of esophagus, TURP x2, hypothyroidism, osteoporosis  Patient lives by himself and had a mechanical fall at home.  He was found to have a right intertrochanteric fracture.  He underwent ORIF on June 6.  Postop course was stable. Patient now is in SNF for therapy.  Only nurses say that he stays confused. Patient did remember falling.  But he keeps repeating things. Denies any pain.  No cough or shortness of breath He has a son and a daughter who live next door to him.  Patient states she was not using any cane or walker at home  Past Medical History:  Diagnosis Date  . BPH (benign prostatic hyperplasia)   . Carotid sinus hypersensitivity   . Carotid stenosis, asymptomatic, bilateral   . Dementia ([place])   . GERD  (gastroesophageal reflux disease)   . Hypertension   . Hypothyroidism   . Osteoporosis   . PONV (postoperative nausea and vomiting)   . Vocal cord cancer (HCC)    at least 13 years ago   Past Surgical History:  Procedure Laterality Date  . COLONOSCOPY  10/25/2012   Procedure: COLONOSCOPY;  Surgeon: Daneil Dolin, MD;  [facility];  Service: Endoscopy;  Laterality: N/A;  1:30  . HERNIA REPAIR    . INTRAMEDULLARY (IM) NAIL INTERTROCHANTERIC Right 03/09/2019   Procedure: INTRAMEDULLARY (IM) NAIL INTERTROCHANTRIC;  Surgeon: Carole Civil, MD;  Location: [facility];  Service: Orthopedics;  Laterality: Right;  . NISSEN FUNDOPLICATION    . PACEMAKER INSERTION    . TRANSURETHRAL RESECTION OF PROSTATE    . vocal cord cancer removal      reports that he has never smoked. He has never used smokeless tobacco. He reports that he does not drink alcohol or use drugs. Social History   Socioeconomic History  . Marital status: Married    Spouse name: Not on file  . Number of children: Not on file  . Years of education: Not on file  . Highest education level: Not on file  Occupational History  . 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 Food insecurity    Worry: Not on file    Inability: Not on file  . Transportation needs    Medical: Not on  file    Non-medical: Not on file  Tobacco Use  . Smoking status: Never Smoker  . Smokeless tobacco: Never Used  Substance and Sexual Activity  . Alcohol use: No    Frequency: Never  . Drug use: No  . Sexual activity: Not on file  Lifestyle  . Physical activity    Days per week: Not on file    Minutes per session: Not on file  . Stress: Not on file  Relationships  . Social Herbalist on phone: Not on file    Gets together: Not on file    Attends religious service: Not on file    Active member of club or organization: Not on file    Attends  meetings of clubs or organizations: Not on file    Relationship status: Not on file  . Intimate partner violence    Fear of current or ex partner: Not on file    Emotionally abused: Not on file    Physically abused: Not on file    Forced sexual activity: Not on file  Other Topics Concern  . Not on file  Social History Narrative   ** Merged History Encounter **        Functional Status Survey:    Family History  Problem Relation Age of Onset  . Colon cancer Neg Hx     Health Maintenance  Topic Date Due  . TETANUS/TDAP  04/11/2019 (Originally 01/06/1944)  . PNA vac Low Risk Adult (1 of 2 - PCV13) 04/11/2019 (Originally 01/07/1992)  . INFLUENZA VACCINE  05/04/2019    Allergies  Allergen Reactions  . Prevacid [Lansoprazole] Other (See Comments)    Does not remeber  . Prilosec [Omeprazole] Other (See Comments)    Does not remeber  . Codeine Other (See Comments)    Does not know   . Penicillins Rash    Outpatient Encounter Medications as of 03/14/2019  Medication Sig  . calcium carbonate (TUMS EX) 750 MG chewable tablet Chew 1 tablet by mouth 2 (two) times daily.  . carvedilol (COREG) 3.125 MG tablet Take 1 tablet (3.125 mg total) by mouth 2 (two) times daily with a meal for 30 days.  Marland Kitchen docusate sodium (COLACE) 100 MG capsule Take 1 capsule (100 mg total) by mouth 2 (two) times daily.  Marland Kitchen enoxaparin (LOVENOX) 40 MG/0.4ML injection Inject 0.4 mLs (40 mg total) into the skin daily.  Marland Kitchen HYDROcodone-acetaminophen (NORCO/VICODIN) 5-325 MG tablet Take 1 tablet by mouth every 4 (four) hours as needed for up to 7 days for moderate pain.  Marland Kitchen levothyroxine (SYNTHROID) 75 MCG tablet Take 1 tablet (75 mcg total) by mouth daily for 30 days.  . NON FORMULARY Diet Type:  Dysphagia 3, Regular Liquids   No facility-administered encounter medications on file as of 03/14/2019.     Review of Systems  Review of Systems  Constitutional: Negative for activity change, appetite change, chills,  diaphoresis, fatigue and fever.  HENT: Negative for mouth sores, postnasal drip, rhinorrhea, sinus pain and sore throat.   Respiratory: Negative for apnea, cough, chest tightness, shortness of breath and wheezing.   Cardiovascular: Negative for chest pain, palpitations and leg swelling.  Gastrointestinal: Negative for abdominal distention, abdominal pain, constipation, diarrhea, nausea and vomiting.  Genitourinary: Negative for dysuria and frequency.  Musculoskeletal: Negative for arthralgias, joint swelling and myalgias.  Skin: Negative for rash.  Neurological: Negative for dizziness, syncope, weakness, light-headedness and numbness.  Psychiatric/Behavioral: Negative for behavioral problems, confusion and sleep disturbance.  Vitals:   03/14/19 0949  BP: 128/70  Pulse: 60  Resp: 17  Temp: 99.1 F (37.3 C)  Weight: 149 lb (67.6 kg)  Height: 6' (1.829 m)   Body mass index is 20.21 kg/m. Physical Exam  Labs reviewed: Basic Metabolic Panel: Recent Labs    03/08/19 2358 03/09/19 0629 03/10/19 0706  NA 139 141 137  K 3.5 3.7 3.5  CL 105 105 105  CO2 23 23 22   GLUCOSE 188* 168* 130*  BUN 28* 27* 22  CREATININE 1.46* 1.21 1.14  CALCIUM 8.7* 8.6* 7.4*   Liver Function Tests: Recent Labs    03/09/19 0629  AST 35  ALT 24  ALKPHOS 82  BILITOT 1.1  PROT 6.8  ALBUMIN 3.7   No results for input(s): LIPASE, AMYLASE in the last 8760 hours. No results for input(s): AMMONIA in the last 8760 hours. CBC: Recent Labs    03/08/19 2358 03/09/19 0629 03/10/19 0706 03/11/19 0602  WBC 12.6* 10.5 8.8 7.9  NEUTROABS 10.8*  --   --   --   HGB 14.6 13.2 10.3* 10.0*  HCT 44.8 38.9* 31.4* 30.4*  MCV 100.7* 99.7 100.3* 100.7*  PLT 173 157 123* 133*   Cardiac Enzymes: Recent Labs    03/09/19 0323  TROPONINI <0.03   BNP: Invalid input(s): POCBNP Lab Results  Component Value Date   HGBA1C 5.3 03/09/2019   No results found for: TSH No results found for: VITAMINB12 No  results found for: FOLATE No results found for: IRON, TIBC, FERRITIN  Imaging and Procedures obtained prior to SNF admission: No results found.  Assessment/Plan Essential hypertension - Plan:  Stable on Coreg  Hypothyroidism,  - Plan: Check TSH On Supplement  Cognitive Impairment Was living by himself  He says he was driving Not sure Has son and Daughter who live next Door Continue to monitor Will need more care after discharge. Family aware of his Cognition Age-related osteoporosis On Fosamax Acute blood loss anemia ]:  HgB Stabe Will repeat CBC and Check B12 level  Intertrochanteric fracture of right femur, On Hydrocodone prn WBAT  On Lovenox Follow with Ortho    Family/ staff Communication:   Labs/tests ordered: CBC and Vit B 12 and BMP  Total time spent in this patient care encounter was  45_  minutes; greater than 50% of the visit spent counseling patient and staff, reviewing records , Labs and coordinating care for problems addressed at this encounter.

## 2019-03-14 NOTE — Progress Notes (Signed)
A user error has taken place.

## 2019-03-15 ENCOUNTER — Encounter (HOSPITAL_COMMUNITY)
Admission: RE | Admit: 2019-03-15 | Discharge: 2019-03-15 | Disposition: A | Discharge status: Home / Self Care | Payer: Medicare Other | Source: Skilled Nursing Facility | Attending: Internal Medicine | Admitting: Internal Medicine

## 2019-03-15 DIAGNOSIS — I110 Hypertensive heart disease with heart failure: Secondary | ICD-10-CM | POA: Insufficient documentation

## 2019-03-15 LAB — CBC WITH DIFFERENTIAL/PLATELET
Abs Immature Granulocytes: 0.04 10*3/uL (ref 0.00–0.07)
Basophils Absolute: 0 10*3/uL (ref 0.0–0.1)
Basophils Relative: 0 %
Eosinophils Absolute: 0.1 10*3/uL (ref 0.0–0.5)
Eosinophils Relative: 2 %
HCT: 30.7 % — ABNORMAL LOW (ref 39.0–52.0)
Hemoglobin: 10.4 g/dL — ABNORMAL LOW (ref 13.0–17.0)
Immature Granulocytes: 1 %
Lymphocytes Relative: 22 %
Lymphs Abs: 1.4 10*3/uL (ref 0.7–4.0)
MCH: 33.8 pg (ref 26.0–34.0)
MCHC: 33.9 g/dL (ref 30.0–36.0)
MCV: 99.7 fL (ref 80.0–100.0)
Monocytes Absolute: 0.9 10*3/uL (ref 0.1–1.0)
Monocytes Relative: 14 %
Neutro Abs: 3.8 10*3/uL (ref 1.7–7.7)
Neutrophils Relative %: 61 %
Platelets: 207 10*3/uL (ref 150–400)
RBC: 3.08 MIL/uL — ABNORMAL LOW (ref 4.22–5.81)
RDW: 15 % (ref 11.5–15.5)
WBC: 6.2 10*3/uL (ref 4.0–10.5)
nRBC: 0 % (ref 0.0–0.2)

## 2019-03-15 LAB — BASIC METABOLIC PANEL
Anion gap: 8 (ref 5–15)
BUN: 23 mg/dL (ref 8–23)
CO2: 23 mmol/L (ref 22–32)
Calcium: 7.8 mg/dL — ABNORMAL LOW (ref 8.9–10.3)
Chloride: 106 mmol/L (ref 98–111)
Creatinine, Ser: 0.86 mg/dL (ref 0.61–1.24)
GFR calc Af Amer: >60 mL/min (ref >60)
GFR calc non Af Amer: >60 mL/min (ref >60)
Glucose, Bld: 95 mg/dL (ref 70–99)
Potassium: 3.3 mmol/L — ABNORMAL LOW (ref 3.5–5.1)
Sodium: 137 mmol/L (ref 135–145)

## 2019-03-15 LAB — TSH: TSH: 4.413 u[IU]/mL (ref 0.350–4.500)

## 2019-03-15 LAB — BRAIN NATRIURETIC PEPTIDE: B Natriuretic Peptide: 195 pg/mL — ABNORMAL HIGH (ref 0.0–100.0)

## 2019-03-15 LAB — VITAMIN B12: Vitamin B-12: 300 pg/mL (ref 180–914)

## 2019-03-19 ENCOUNTER — Non-Acute Institutional Stay (SKILLED_NURSING_FACILITY): Payer: Medicare Other | Admitting: Adult Health

## 2019-03-19 ENCOUNTER — Encounter: Payer: Self-pay | Admitting: Adult Health

## 2019-03-19 ENCOUNTER — Other Ambulatory Visit: Payer: Self-pay | Admitting: *Deleted

## 2019-03-19 DIAGNOSIS — S72144S Nondisplaced intertrochanteric fracture of right femur, sequela: Secondary | ICD-10-CM | POA: Diagnosis not present

## 2019-03-19 DIAGNOSIS — E876 Hypokalemia: Secondary | ICD-10-CM | POA: Diagnosis not present

## 2019-03-19 NOTE — Progress Notes (Signed)
Location:  [place] Room Number: 160 p Place of Service:  SNF (31)   CODE STATUS: Full Code  Allergies  Allergen Reactions  . Prevacid [Lansoprazole] Other (See Comments)    Does not remeber  . Prilosec [Omeprazole] Other (See Comments)    Does not remeber  . Codeine Other (See Comments)    Does not know   . Penicillins Rash    Chief Complaint  Patient presents with  . Acute Visit    Pain Managment    HPI:  He is status post a right hip fracture with nailing. He has vicodin 5/325 mg ordered for pain management. He has not used this medication. He denies any uncontrolled pain. He denies any changes in his appetite; he is sleeping at night. There are no reports of fevers present.   Past Medical History:  Diagnosis Date  . BPH (benign prostatic hyperplasia)   . Carotid sinus hypersensitivity   . Carotid stenosis, asymptomatic, bilateral   . Dementia ([place])   . GERD (gastroesophageal reflux disease)   . Hypertension   . Hypothyroidism   . Osteoporosis   . PONV (postoperative nausea and vomiting)   . Vocal cord cancer (HCC)    at least 13 years ago    Past Surgical History:  Procedure Laterality Date  . COLONOSCOPY  10/25/2012   Procedure: COLONOSCOPY;  Surgeon: Daneil Dolin, MD;  [facility];  Service: Endoscopy;  Laterality: N/A;  1:30  . HERNIA REPAIR    . INTRAMEDULLARY (IM) NAIL INTERTROCHANTERIC Right 03/09/2019   Procedure: INTRAMEDULLARY (IM) NAIL INTERTROCHANTRIC;  Surgeon: Carole Civil, MD;  Location: [facility];  Service: Orthopedics;  Laterality: Right;  . NISSEN FUNDOPLICATION    . PACEMAKER INSERTION    . TRANSURETHRAL RESECTION OF PROSTATE    . vocal cord cancer removal      Social History   Socioeconomic History  . Marital status: Married    Spouse name: Not on file  . Number of children: Not on file  . Years of education: Not on file  . Highest education level: Not on file  Occupational History   . 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 Food insecurity    Worry: Not on file    Inability: Not on file  . Transportation needs    Medical: Not on file    Non-medical: Not on file  Tobacco Use  . Smoking status: Never Smoker  . Smokeless tobacco: Never Used  Substance and Sexual Activity  . Alcohol use: No    Frequency: Never  . Drug use: No  . Sexual activity: Not on file  Lifestyle  . Physical activity    Days per week: Not on file    Minutes per session: Not on file  . Stress: Not on file  Relationships  . Social Herbalist on phone: Not on file    Gets together: Not on file    Attends religious service: Not on file    Active member of club or organization: Not on file    Attends meetings of clubs or organizations: Not on file    Relationship status: Not on file  . Intimate partner violence    Fear of current or ex partner: Not on file    Emotionally abused: Not on file    Physically abused: Not on file  Forced sexual activity: Not on file  Other Topics Concern  . Not on file  Social History Narrative   ** Merged History Encounter **       Family History  Problem Relation Age of Onset  . Colon cancer Neg Hx       VITAL SIGNS BP 124/60   Pulse 71   Temp (!) 97 F (36.1 C)   Resp 20   Ht 6' (1.829 m)   Wt 147 lb 8 oz (66.9 kg)   BMI 20.00 kg/m   Outpatient Encounter Medications as of 03/19/2019  Medication Sig  . calcium carbonate (TUMS EX) 750 MG chewable tablet Chew 1 tablet by mouth 2 (two) times daily.  . carvedilol (COREG) 3.125 MG tablet Take 1 tablet (3.125 mg total) by mouth 2 (two) times daily with a meal for 30 days.  Marland Kitchen docusate sodium (COLACE) 100 MG capsule Take 1 capsule (100 mg total) by mouth 2 (two) times daily.  Marland Kitchen enoxaparin (LOVENOX) 40 MG/0.4ML injection Inject 0.4 mLs (40 mg total) into the skin daily.  Marland Kitchen HYDROcodone-acetaminophen  (NORCO/VICODIN) 5-325 MG tablet Take 1 tablet by mouth every 4 (four) hours as needed for up to 7 days for moderate pain.  Marland Kitchen levothyroxine (SYNTHROID) 75 MCG tablet Take 1 tablet (75 mcg total) by mouth daily for 30 days.  . NON FORMULARY Diet Type:  Dysphagia 3, Regular Liquids  . Nutritional Supplements (ENSURE ENLIVE PO) Take 1 Bottle by mouth daily.  . potassium chloride SA (K-DUR) 20 MEQ tablet Take 20 mEq by mouth daily.  . vitamin B-12 (CYANOCOBALAMIN) 1000 MCG tablet Take 1,000 mcg by mouth daily.   No facility-administered encounter medications on file as of 03/19/2019.      SIGNIFICANT DIAGNOSTIC EXAMS  PREVIOUS;   03-09-19: right hip x-ray: Comminuted intertrochanteric fracture of the right femoral neck.  03-09-19: chest x-ray: no active disease   NO NEW EXAMS   LABS REVIEWED PREVIOUS;   03-08-19: wbc 12.6; hgb 14.6; hct 44.8; mcv 100.7 ;plt 173; glucose 188; bun 28; creat 1.46; k+ 3.5; na++ 139; ca 8.7  03-09-19; wbc 10.5; hgb 13.2; hct 38.9; mcv 99.7; plt 157 glucose 168; bun 27; creat 1.21; k+ 3.7; na++ 141; ca 8.6 liver normal albumin 3.7 hgb a1c 5.3  03-10-19: glucose 130; bun 22; creat 1.14; k+ 3.5; an++ 137; ca 7.4  03-11-19: wbc 7.9; hgb 10.0; hct 30.4 mcv 1001.7; plt 133  TODAY;   03-15-19: wbc 6.2; hgb 10.4; hct 30.7; mcv 99.7; plt 207; glucose 95; bun 23; creat 0.86; k+ 3.3; na++ 137; ca 7.8; vit B 12: 300; BNP 195.0 tsh 4.413   Review of Systems  Constitutional: Negative for malaise/fatigue.  Respiratory: Negative for cough and shortness of breath.   Cardiovascular: Negative for chest pain, palpitations and leg swelling.  Gastrointestinal: Negative for abdominal pain, constipation and heartburn.  Musculoskeletal: Negative for back pain, joint pain and myalgias.  Skin: Negative.   Neurological: Negative for dizziness.  Psychiatric/Behavioral: The patient is not nervous/anxious.      Physical Exam Constitutional:      General: He is not in acute distress.     Appearance: He is well-developed. He is not diaphoretic.  HENT:     Head:     Comments: History of vocal cord cancer removal  Neck:     Musculoskeletal: Neck supple.     Thyroid: No thyromegaly.  Cardiovascular:     Rate and Rhythm: Normal rate and regular rhythm.  Pulses: Normal pulses.     Heart sounds: Normal heart sounds.     Comments: Psychologist, forensic present  Pulmonary:     Effort: Pulmonary effort is normal. No respiratory distress.     Breath sounds: Normal breath sounds.  Abdominal:     General: Bowel sounds are normal. There is no distension.     Palpations: Abdomen is soft.     Tenderness: There is no abdominal tenderness.  Genitourinary:    Comments: History of turp Musculoskeletal:     Right lower leg: No edema.     Left lower leg: No edema.     Comments: Is able to move all extremities Is status post right hip fracture   Lymphadenopathy:     Cervical: No cervical adenopathy.  Skin:    General: Skin is warm and dry.     Comments:  Right hip incision lines without signs of infection present  Bilateral lower extremities discolored     Neurological:     Mental Status: He is alert. Mental status is at baseline.  Psychiatric:        Mood and Affect: Mood normal.      ASSESSMENT/ PLAN:  TODAY:   1. Closed nondisplaced intertrochanteric fracture of right femur sequela 2. Hypokalemia: k+ 3.3   Will stop vicodin 5/325 mg tabs due to non use He does have prn tylenol ordered for pain Will check k+ level.        MD is aware of resident's narcotic use and is in agreement with current plan of care. We will attempt to wean resident as apropriate   [name] NP [facility] Adult Medicine  Contact [phone] Monday through Friday 8am- 5pm  After hours call [phone]

## 2019-03-19 NOTE — Patient Outreach (Signed)
Member discussed in telephonic IDT meeting with [facility] staff and [facility] UM RN.  Phillip Taylor is currently at [facility] receiving rehab therapy.  Member is being assessed for potential [facility] Care Management needs as a benefit of his [employer].  Facility reports [place] family  members live in very close proximity.   Will continue to follow for potential [facility] Care Management needs and collaborate with [facility] UM team and facility staff.    Marthenia Rolling, MSN-Ed, RN,BSN [facility] Acute Care Coordinator [phone]

## 2019-03-20 DIAGNOSIS — E876 Hypokalemia: Secondary | ICD-10-CM | POA: Insufficient documentation

## 2019-03-21 ENCOUNTER — Non-Acute Institutional Stay (SKILLED_NURSING_FACILITY): Payer: Medicare Other | Admitting: Adult Health

## 2019-03-21 ENCOUNTER — Encounter: Payer: Self-pay | Admitting: Adult Health

## 2019-03-21 ENCOUNTER — Encounter (HOSPITAL_COMMUNITY)
Admission: RE | Admit: 2019-03-21 | Discharge: 2019-03-21 | Disposition: A | Discharge status: Home / Self Care | Payer: Medicare Other | Source: Skilled Nursing Facility | Attending: *Deleted | Admitting: *Deleted

## 2019-03-21 DIAGNOSIS — E039 Hypothyroidism, unspecified: Secondary | ICD-10-CM

## 2019-03-21 DIAGNOSIS — I10 Essential (primary) hypertension: Secondary | ICD-10-CM

## 2019-03-21 DIAGNOSIS — S72144S Nondisplaced intertrochanteric fracture of right femur, sequela: Secondary | ICD-10-CM

## 2019-03-21 LAB — POTASSIUM: Potassium: 3.9 mmol/L (ref 3.5–5.1)

## 2019-03-21 NOTE — Progress Notes (Signed)
Location:   [facility] Room Number: 160 P Place of Service:  SNF (31)   CODE STATUS: Full Code  Allergies  Allergen Reactions  . Prevacid [Lansoprazole] Other (See Comments)    Does not remeber  . Prilosec [Omeprazole] Other (See Comments)    Does not remeber  . Codeine Other (See Comments)    Does not know   . Penicillins Rash    Chief Complaint  Patient presents with  . Medical Management of Chronic Issues      Closed nondisplaced intertrochanteric fracture of right femur sequela:Essential hypertension: Hypothyroidism: unspecified type:   Weekly follow up for the first 30 days post hospitalization.     HPI:  He is a 85 year old short term rehab patient being seen for the management of his chronic illnesses: right femur fracture; hypertension; hypothyroidism. He denies any uncontrolled pain; no headaches; no changes in appetite; no excessive fatigue. There are no reports of fevers present.   Past Medical History:  Diagnosis Date  . BPH (benign prostatic hyperplasia)   . Carotid sinus hypersensitivity   . Carotid stenosis, asymptomatic, bilateral   . Dementia ([place])   . GERD (gastroesophageal reflux disease)   . Hypertension   . Hypothyroidism   . Osteoporosis   . PONV (postoperative nausea and vomiting)   . Vocal cord cancer (HCC)    at least 13 years ago    Past Surgical History:  Procedure Laterality Date  . COLONOSCOPY  10/25/2012   Procedure: COLONOSCOPY;  Surgeon: Daneil Dolin, MD;  Location[facility];  Service: Endoscopy;  Laterality: N/A;  1:30  . HERNIA REPAIR    . INTRAMEDULLARY (IM) NAIL INTERTROCHANTERIC Right 03/09/2019   Procedure: INTRAMEDULLARY (IM) NAIL INTERTROCHANTRIC;  Surgeon: Carole Civil, MD;  Location: [facility] ORS;  Service: Orthopedics;  Laterality: Right;  . NISSEN FUNDOPLICATION    . PACEMAKER INSERTION    . TRANSURETHRAL RESECTION OF PROSTATE    . vocal cord cancer removal      Social History    Socioeconomic History  . Marital status: Married    Spouse name: Not on file  . Number of children: Not on file  . Years of education: Not on file  . Highest education level: Not on file  Occupational History  . 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 Food insecurity    Worry: Not on file    Inability: Not on file  . Transportation needs    Medical: Not on file    Non-medical: Not on file  Tobacco Use  . Smoking status: Never Smoker  . Smokeless tobacco: Never Used  Substance and Sexual Activity  . Alcohol use: No    Frequency: Never  . Drug use: No  . Sexual activity: Not on file  Lifestyle  . Physical activity    Days per week: Not on file    Minutes per session: Not on file  . Stress: Not on file  Relationships  . Social Herbalist on phone: Not on file    Gets together: Not on file    Attends religious service: Not on file    Active member of club or organization: Not on file    Attends meetings of clubs or organizations: Not on file    Relationship status: Not on file  . Intimate partner violence    Fear  of current or ex partner: Not on file    Emotionally abused: Not on file    Physically abused: Not on file    Forced sexual activity: Not on file  Other Topics Concern  . Not on file  Social History Narrative   ** Merged History Encounter **       Family History  Problem Relation Age of Onset  . Colon cancer Neg Hx       VITAL SIGNS BP 129/74   Pulse 79   Temp 98.4 F (36.9 C)   Resp 20   Ht 6' (1.829 m)   Wt 147 lb 8 oz (66.9 kg)   BMI 20.00 kg/m   Outpatient Encounter Medications as of 03/21/2019  Medication Sig  . calcium carbonate (TUMS EX) 750 MG chewable tablet Chew 1 tablet by mouth 2 (two) times daily.  . carvedilol (COREG) 3.125 MG tablet Take 1 tablet (3.125 mg total) by mouth 2 (two) times daily with a meal for 30 days.  Marland Kitchen docusate sodium  (COLACE) 100 MG capsule Take 1 capsule (100 mg total) by mouth 2 (two) times daily.  Marland Kitchen enoxaparin (LOVENOX) 40 MG/0.4ML injection Inject 0.4 mLs (40 mg total) into the skin daily.  Marland Kitchen levothyroxine (SYNTHROID) 75 MCG tablet Take 1 tablet (75 mcg total) by mouth daily for 30 days.  . NON FORMULARY Diet Type:  Dysphagia 3, Regular Liquids  . Nutritional Supplements (ENSURE ENLIVE PO) Take 1 Bottle by mouth daily.  . potassium chloride SA (K-DUR) 20 MEQ tablet Take 20 mEq by mouth daily.  . vitamin B-12 (CYANOCOBALAMIN) 1000 MCG tablet Take 1,000 mcg by mouth daily.   No facility-administered encounter medications on file as of 03/21/2019.      SIGNIFICANT DIAGNOSTIC EXAMS  PREVIOUS;   03-09-19: right hip x-ray: Comminuted intertrochanteric fracture of the right femoral neck.  03-09-19: chest x-ray: no active disease   NO NEW EXAMS   LABS REVIEWED PREVIOUS;   03-08-19: wbc 12.6; hgb 14.6; hct 44.8; mcv 100.7 ;plt 173; glucose 188; bun 28; creat 1.46; k+ 3.5; na++ 139; ca 8.7  03-09-19; wbc 10.5; hgb 13.2; hct 38.9; mcv 99.7; plt 157 glucose 168; bun 27; creat 1.21; k+ 3.7; na++ 141; ca 8.6 liver normal albumin 3.7 hgb a1c 5.3  03-10-19: glucose 130; bun 22; creat 1.14; k+ 3.5; an++ 137; ca 7.4  03-11-19: wbc 7.9; hgb 10.0; hct 30.4 mcv 1001.7; plt 133 03-15-19: wbc 6.2; hgb 10.4; hct 30.7; mcv 99.7; plt 207; glucose 95; bun 23; creat 0.86; k+ 3.3; na++ 137; ca 7.8; vit B 12: 300; BNP 195.0 tsh 4.413  TODAY;   03-21-19 k+ 3.9     Review of Systems  Constitutional: Negative for malaise/fatigue.  Respiratory: Negative for cough and shortness of breath.   Cardiovascular: Negative for chest pain, palpitations and leg swelling.  Gastrointestinal: Negative for abdominal pain, constipation and heartburn.  Musculoskeletal: Negative for back pain, joint pain and myalgias.  Skin: Negative.   Neurological: Negative for dizziness.  Psychiatric/Behavioral: The patient is not nervous/anxious.      Physical Exam Constitutional:      General: He is not in acute distress.    Appearance: He is well-developed. He is not diaphoretic.  HENT:     Head:     Comments: History of vocal cord cancer removal  Neck:     Musculoskeletal: Neck supple.     Thyroid: No thyromegaly.  Cardiovascular:     Rate and Rhythm: Normal rate and regular  rhythm.     Pulses: Normal pulses.     Heart sounds: Normal heart sounds.     Comments: Psychologist, forensic in place  Pulmonary:     Effort: Pulmonary effort is normal. No respiratory distress.     Breath sounds: Normal breath sounds.  Abdominal:     General: Bowel sounds are normal. There is no distension.     Palpations: Abdomen is soft.     Tenderness: There is no abdominal tenderness.  Genitourinary:    Comments: History of turp Musculoskeletal:     Right lower leg: No edema.     Left lower leg: No edema.     Comments: Is able to move all extremities Is status post right hip fracture    Lymphadenopathy:     Cervical: No cervical adenopathy.  Skin:    General: Skin is warm and dry.     Comments: Bilateral lower extremities discolored      Neurological:     Mental Status: He is alert. Mental status is at baseline.  Psychiatric:        Mood and Affect: Mood normal.     ASSESSMENT/ PLAN:  TODAY:    TODAY:   1. Closed nondisplaced intertrochanteric fracture of right femur sequela: is stable will continue therapy and will follow up with orthopedics; will continue lovenox 40 mg daily for total 35 days then asa 81 mg daily will monitor   2. Essential hypertension: is stable b/p 129/74: will continue coreg 3.125 mg twice daily   3. Hypothyroidism: unspecified type: is stable tsh 4.413 will continue synthroid 75 mcg daily   PREVIOUS   4. Chronic constipation: is stable will continue colace twice daily   5. Carotid artery stenosis bilateral  (R 60-90%; L 40-59% 2018) is stable will monitor   6. Carotid sinus hypersensitivity: is status post pace  maker insertion will monitor   7. Carcinoma of larynx: status post resection and radiation therapy 1995. Will monitor   8. GERD without esophagitis is stable will monitor   9. Hypocalcemia: is without change ca 7. will continue tums 750 mg twice daily   10. Acute blood loss anemia: hgb 10.4; will monitor   11. Dementia without behavioral disturbance; is without change: weight is 147 (previous 151)  pounds will monitor       MD is aware of resident's narcotic use and is in agreement with current plan of care. We will attempt to wean resident as apropriate   [name] NP [facility]  Contact [phone] Monday through Friday 8am- 5pm  After hours call [phone]

## 2019-03-26 ENCOUNTER — Non-Acute Institutional Stay (SKILLED_NURSING_FACILITY): Payer: Medicare Other | Admitting: Adult Health

## 2019-03-26 ENCOUNTER — Encounter: Payer: Self-pay | Admitting: Adult Health

## 2019-03-26 ENCOUNTER — Other Ambulatory Visit (HOSPITAL_COMMUNITY)
Admission: RE | Admit: 2019-03-26 | Discharge: 2019-03-26 | Disposition: A | Discharge status: Home / Self Care | Payer: Medicare Other | Source: Skilled Nursing Facility | Attending: Adult Health | Admitting: Adult Health

## 2019-03-26 DIAGNOSIS — R748 Abnormal levels of other serum enzymes: Secondary | ICD-10-CM | POA: Diagnosis not present

## 2019-03-26 DIAGNOSIS — R55 Syncope and collapse: Secondary | ICD-10-CM

## 2019-03-26 DIAGNOSIS — E860 Dehydration: Secondary | ICD-10-CM | POA: Diagnosis not present

## 2019-03-26 DIAGNOSIS — I10 Essential (primary) hypertension: Secondary | ICD-10-CM | POA: Insufficient documentation

## 2019-03-26 LAB — COMPREHENSIVE METABOLIC PANEL
ALT: 24 U/L (ref 0–44)
AST: 38 U/L (ref 15–41)
Albumin: 3.3 g/dL — ABNORMAL LOW (ref 3.5–5.0)
Alkaline Phosphatase: 192 U/L — ABNORMAL HIGH (ref 38–126)
Anion gap: 11 (ref 5–15)
BUN: 31 mg/dL — ABNORMAL HIGH (ref 8–23)
CO2: 21 mmol/L — ABNORMAL LOW (ref 22–32)
Calcium: 8.4 mg/dL — ABNORMAL LOW (ref 8.9–10.3)
Chloride: 105 mmol/L (ref 98–111)
Creatinine, Ser: 1.16 mg/dL (ref 0.61–1.24)
GFR calc Af Amer: >60 mL/min (ref >60)
GFR calc non Af Amer: 54 mL/min — ABNORMAL LOW (ref >60)
Glucose, Bld: 163 mg/dL — ABNORMAL HIGH (ref 70–99)
Potassium: 4.2 mmol/L (ref 3.5–5.1)
Sodium: 137 mmol/L (ref 135–145)
Total Bilirubin: 2 mg/dL — ABNORMAL HIGH (ref 0.3–1.2)
Total Protein: 7 g/dL (ref 6.5–8.1)

## 2019-03-26 LAB — CBC
HCT: 43 % (ref 39.0–52.0)
Hemoglobin: 13.8 g/dL (ref 13.0–17.0)
MCH: 33.7 pg (ref 26.0–34.0)
MCHC: 32.1 g/dL (ref 30.0–36.0)
MCV: 104.9 fL — ABNORMAL HIGH (ref 80.0–100.0)
Platelets: 286 10*3/uL (ref 150–400)
RBC: 4.1 MIL/uL — ABNORMAL LOW (ref 4.22–5.81)
RDW: 15.8 % — ABNORMAL HIGH (ref 11.5–15.5)
WBC: 9.9 10*3/uL (ref 4.0–10.5)
nRBC: 0 % (ref 0.0–0.2)

## 2019-03-26 NOTE — Progress Notes (Signed)
Location:   [place] Room Number: 160 P Place of Service:  SNF (31)   CODE STATUS: DNR  Allergies  Allergen Reactions  . Prevacid [Lansoprazole] Other (See Comments)    Does not remeber  . Prilosec [Omeprazole] Other (See Comments)    Does not remeber  . Codeine Other (See Comments)    Does not know   . Penicillins Rash    Chief Complaint  Patient presents with  . Acute Visit    Family Concerns    HPI:  He had a vasovagal episode in the bathroom with a blood pressure of 80/50. His po/fluid intake is poor. His son states that he has always been a poor drinker and eater. There are no reports of fevers.    Past Medical History:  Diagnosis Date  . BPH (benign prostatic hyperplasia)   . Carotid sinus hypersensitivity   . Carotid stenosis, asymptomatic, bilateral   . Dementia ([place])   . GERD (gastroesophageal reflux disease)   . Hypertension   . Hypothyroidism   . Osteoporosis   . PONV (postoperative nausea and vomiting)   . Vocal cord cancer (HCC)    at least 13 years ago    Past Surgical History:  Procedure Laterality Date  . COLONOSCOPY  10/25/2012   Procedure: COLONOSCOPY;  Surgeon: Daneil Dolin, MD;  Location: [facility];  Service: Endoscopy;  Laterality: N/A;  1:30  . HERNIA REPAIR    . INTRAMEDULLARY (IM) NAIL INTERTROCHANTERIC Right 03/09/2019   Procedure: INTRAMEDULLARY (IM) NAIL INTERTROCHANTRIC;  Surgeon: Carole Civil, MD;  Location: [facility] ORS;  Service: Orthopedics;  Laterality: Right;  . NISSEN FUNDOPLICATION    . PACEMAKER INSERTION    . TRANSURETHRAL RESECTION OF PROSTATE    . vocal cord cancer removal      Social History   Socioeconomic History  . Marital status: Married    Spouse name: Not on file  . Number of children: Not on file  . Years of education: Not on file  . Highest education level: Not on file  Occupational History  . 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 Financial resource strain: Not on file  . Food insecurity    Worry: Not on file    Inability: Not on file  . Transportation needs    Medical: Not on file    Non-medical: Not on file  Tobacco Use  . Smoking status: Never Smoker  . Smokeless tobacco: Never Used  Substance and Sexual Activity  . Alcohol use: No    Frequency: Never  . Drug use: No  . Sexual activity: Not on file  Lifestyle  . Physical activity    Days per week: Not on file    Minutes per session: Not on file  . Stress: Not on file  Relationships  . Social Herbalist on phone: Not on file    Gets together: Not on file    Attends religious service: Not on file    Active member of club or organization: Not on file    Attends meetings of clubs or organizations: Not on file    Relationship status: Not on file  . Intimate partner violence    Fear of current or ex partner: Not on file    Emotionally abused: Not on file    Physically abused: Not on file    Forced sexual activity: Not on file  Other  Topics Concern  . Not on file  Social History Narrative   ** Merged History Encounter **       Family History  Problem Relation Age of Onset  . Colon cancer Neg Hx       VITAL SIGNS BP (!) 80/50   Pulse 69   Temp (!) 97.2 F (36.2 C)   Resp 14   Ht 6' (1.829 m)   Wt 135 lb 3.2 oz (61.3 kg)   SpO2 98%   BMI 18.34 kg/m   Outpatient Encounter Medications as of 03/26/2019  Medication Sig  . calcium carbonate (TUMS EX) 750 MG chewable tablet Chew 1 tablet by mouth 2 (two) times daily.  . carvedilol (COREG) 3.125 MG tablet Take 1 tablet (3.125 mg total) by mouth 2 (two) times daily with a meal for 30 days.  Marland Kitchen docusate sodium (COLACE) 100 MG capsule Take 1 capsule (100 mg total) by mouth 2 (two) times daily.  Marland Kitchen enoxaparin (LOVENOX) 40 MG/0.4ML injection Inject 0.4 mLs (40 mg total) into the skin daily.  Marland Kitchen levothyroxine (SYNTHROID) 75 MCG tablet Take 1 tablet (75 mcg total) by mouth daily for  30 days.  . NON FORMULARY Diet Type:  Dysphagia 3, Regular Liquids  . Nutritional Supplements (ENSURE ENLIVE PO) Take 1 Bottle by mouth daily.  . potassium chloride SA (K-DUR) 20 MEQ tablet Take 20 mEq by mouth daily.  . vitamin B-12 (CYANOCOBALAMIN) 1000 MCG tablet Take 1,000 mcg by mouth daily.   No facility-administered encounter medications on file as of 03/26/2019.      SIGNIFICANT DIAGNOSTIC EXAMS  PREVIOUS;   03-09-19: right hip x-ray: Comminuted intertrochanteric fracture of the right femoral neck.  03-09-19: chest x-ray: no active disease   NO NEW EXAMS   LABS REVIEWED PREVIOUS;   03-08-19: wbc 12.6; hgb 14.6; hct 44.8; mcv 100.7 ;plt 173; glucose 188; bun 28; creat 1.46; k+ 3.5; na++ 139; ca 8.7  03-09-19; wbc 10.5; hgb 13.2; hct 38.9; mcv 99.7; plt 157 glucose 168; bun 27; creat 1.21; k+ 3.7; na++ 141; ca 8.6 liver normal albumin 3.7 hgb a1c 5.3  03-10-19: glucose 130; bun 22; creat 1.14; k+ 3.5; an++ 137; ca 7.4  03-11-19: wbc 7.9; hgb 10.0; hct 30.4 mcv 1001.7; plt 133 03-15-19: wbc 6.2; hgb 10.4; hct 30.7; mcv 99.7; plt 207; glucose 95; bun 23; creat 0.86; k+ 3.3; na++ 137; ca 7.8; vit B 12: 300; BNP 195.0 tsh 4.413 03-21-19 k+ 3.9  LABS REVIEWED  03-26-19; wbc 9.9; hgb 13.8; hct 43.0; mcv 104.9; plt 286; glucose 163; bun 31; creat 1.16 ;k+ 4.2; na++ 137; ca 8.4 alk phos 192; total bili 2.0 albumin 3.3      Review of Systems  Constitutional: Negative for malaise/fatigue.  Respiratory: Negative for cough and shortness of breath.   Cardiovascular: Negative for chest pain, palpitations and leg swelling.  Gastrointestinal: Negative for abdominal pain, constipation and heartburn.  Musculoskeletal: Negative for back pain, joint pain and myalgias.  Skin: Negative.   Neurological: Negative for dizziness.  Psychiatric/Behavioral: The patient is not nervous/anxious.       Physical Exam Constitutional:      General: He is not in acute distress.    Appearance: He is underweight. He  is not diaphoretic.  HENT:     Head:     Comments: History of vocal cord cancer removal     Mouth/Throat:     Comments: Oral membranes dry;  Neck:     Musculoskeletal: Neck supple.  Thyroid: No thyromegaly.  Cardiovascular:     Rate and Rhythm: Normal rate and regular rhythm.     Pulses: Normal pulses.     Heart sounds: Normal heart sounds.     Comments: Pace maker  Pulmonary:     Effort: Pulmonary effort is normal. No respiratory distress.     Breath sounds: Normal breath sounds.  Abdominal:     General: Bowel sounds are normal. There is no distension.     Palpations: Abdomen is soft.     Tenderness: There is no abdominal tenderness.  Genitourinary:    Comments: History of turp  Musculoskeletal:     Right lower leg: No edema.     Left lower leg: No edema.     Comments: Is able to move all extremities Is status post right hip fracture     Lymphadenopathy:     Cervical: No cervical adenopathy.  Skin:    General: Skin is warm and dry.     Comments: Poor skin turgor  Bilateral lower extremities discolored       Neurological:     Mental Status: He is alert. Mental status is at baseline.  Psychiatric:        Mood and Affect: Mood normal.     ASSESSMENT/ PLAN:  TODAY:  1. Acute dehydration 2. Elevated liver enzymes 3. Vaso-vagal stimuli   Will give NS 500 cc now then 75 cc per hour for 2 liters Liver ultrasound  In am will get live function and GGT        MD is aware of resident's narcotic use and is in agreement with current plan of care. We will attempt to wean resident as apropriate   Phillip Edwards NP [facility]  Contact [phone] Monday through Friday 8am- 5pm  After hours call [phone]

## 2019-03-27 ENCOUNTER — Other Ambulatory Visit (HOSPITAL_COMMUNITY)
Admission: RE | Admit: 2019-03-27 | Discharge: 2019-03-27 | Disposition: A | Discharge status: Home / Self Care | Payer: Medicare Other | Source: Ambulatory Visit | Attending: Adult Health | Admitting: Adult Health

## 2019-03-27 ENCOUNTER — Ambulatory Visit (HOSPITAL_COMMUNITY)
Admission: RE | Admit: 2019-03-27 | Discharge: 2019-03-27 | Disposition: A | Discharge status: Home / Self Care | Payer: Medicare Other | Source: Skilled Nursing Facility | Attending: Adult Health | Admitting: Adult Health

## 2019-03-27 ENCOUNTER — Encounter: Payer: Self-pay | Admitting: Adult Health

## 2019-03-27 ENCOUNTER — Non-Acute Institutional Stay (SKILLED_NURSING_FACILITY): Payer: Medicare Other | Admitting: Adult Health

## 2019-03-27 DIAGNOSIS — E43 Unspecified severe protein-calorie malnutrition: Secondary | ICD-10-CM

## 2019-03-27 DIAGNOSIS — S72144S Nondisplaced intertrochanteric fracture of right femur, sequela: Secondary | ICD-10-CM | POA: Diagnosis not present

## 2019-03-27 DIAGNOSIS — R748 Abnormal levels of other serum enzymes: Secondary | ICD-10-CM

## 2019-03-27 DIAGNOSIS — K824 Cholesterolosis of gallbladder: Secondary | ICD-10-CM | POA: Diagnosis not present

## 2019-03-27 DIAGNOSIS — R945 Abnormal results of liver function studies: Secondary | ICD-10-CM | POA: Diagnosis not present

## 2019-03-27 DIAGNOSIS — I110 Hypertensive heart disease with heart failure: Secondary | ICD-10-CM | POA: Diagnosis not present

## 2019-03-27 LAB — HEPATIC FUNCTION PANEL
ALT: 21 U/L (ref 0–44)
AST: 32 U/L (ref 15–41)
Albumin: 2.7 g/dL — ABNORMAL LOW (ref 3.5–5.0)
Alkaline Phosphatase: 179 U/L — ABNORMAL HIGH (ref 38–126)
Bilirubin, Direct: 0.4 mg/dL — ABNORMAL HIGH (ref 0.0–0.2)
Indirect Bilirubin: 1.2 mg/dL — ABNORMAL HIGH (ref 0.3–0.9)
Total Bilirubin: 1.6 mg/dL — ABNORMAL HIGH (ref 0.3–1.2)
Total Protein: 6.1 g/dL — ABNORMAL LOW (ref 6.5–8.1)

## 2019-03-27 LAB — BASIC METABOLIC PANEL
Anion gap: 11 (ref 5–15)
BUN: 23 mg/dL (ref 8–23)
CO2: 20 mmol/L — ABNORMAL LOW (ref 22–32)
Calcium: 8 mg/dL — ABNORMAL LOW (ref 8.9–10.3)
Chloride: 109 mmol/L (ref 98–111)
Creatinine, Ser: 0.84 mg/dL (ref 0.61–1.24)
GFR calc Af Amer: >60 mL/min (ref >60)
GFR calc non Af Amer: >60 mL/min (ref >60)
Glucose, Bld: 113 mg/dL — ABNORMAL HIGH (ref 70–99)
Potassium: 3.9 mmol/L (ref 3.5–5.1)
Sodium: 140 mmol/L (ref 135–145)

## 2019-03-27 LAB — GAMMA GT: GGT: 24 U/L (ref 7–50)

## 2019-03-27 NOTE — Progress Notes (Signed)
Location:   [place] Room Number: 160 P Place of Service:  SNF (31)   CODE STATUS: DNR  Allergies  Allergen Reactions  . Prevacid [Lansoprazole] Other (See Comments)    Does not remeber  . Prilosec [Omeprazole] Other (See Comments)    Does not remeber  . Codeine Other (See Comments)    Does not know   . Penicillins Rash    Chief Complaint  Patient presents with  . Acute Visit    Care Plan Meeting    HPI:  We have come together for his routine care plan meeting. BIMS 12/15 depression 9/30. He continues to participate in therapy. His goal remains to return back home. His liver enzymes have been elevated. More than likely this does represent an inflammatory process; however will need to check hepatitis panel. He is doing better since he has received his IVF. He denies any pain; no changes in his appetite; he denies any insomnia; no weakness. He will need 24 hour care upon discharge.   Past Medical History:  Diagnosis Date  . BPH (benign prostatic hyperplasia)   . Carotid sinus hypersensitivity   . Carotid stenosis, asymptomatic, bilateral   . Dementia ([place])   . GERD (gastroesophageal reflux disease)   . Hypertension   . Hypothyroidism   . Osteoporosis   . PONV (postoperative nausea and vomiting)   . Vocal cord cancer (H[facility])    at least 13 years ago    Past Surgical History:  Procedure Laterality Date  . COLONOSCOPY  10/25/2012   Procedure: COLONOSCOPY;  Surgeon: Daneil Dolin, MD;  Location: [facility];  Service: Endoscopy;  Laterality: N/A;  1:30  . HERNIA REPAIR    . INTRAMEDULLARY (IM) NAIL INTERTROCHANTERIC Right 03/09/2019   Procedure: INTRAMEDULLARY (IM) NAIL INTERTROCHANTRIC;  Surgeon: Carole Civil, MD;  Location: [facility] ORS;  Service: Orthopedics;  Laterality: Right;  . NISSEN FUNDOPLICATION    . PACEMAKER INSERTION    . TRANSURETHRAL RESECTION OF PROSTATE    . vocal cord cancer removal      Social History    Socioeconomic History  . Marital status: Married    Spouse name: Not on file  . Number of children: Not on file  . Years of education: Not on file  . Highest education level: Not on file  Occupational History  . 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 Food insecurity    Worry: Not on file    Inability: Not on file  . Transportation needs    Medical: Not on file    Non-medical: Not on file  Tobacco Use  . Smoking status: Never Smoker  . Smokeless tobacco: Never Used  Substance and Sexual Activity  . Alcohol use: No    Frequency: Never  . Drug use: No  . Sexual activity: Not on file  Lifestyle  . Physical activity    Days per week: Not on file    Minutes per session: Not on file  . Stress: Not on file  Relationships  . Social Herbalist on phone: Not on file    Gets together: Not on file    Attends religious service: Not on file    Active member of club or organization: Not on file    Attends meetings of clubs or organizations: Not on file    Relationship status: Not on file  .  Intimate partner violence    Fear of current or ex partner: Not on file    Emotionally abused: Not on file    Physically abused: Not on file    Forced sexual activity: Not on file  Other Topics Concern  . Not on file  Social History Narrative   ** Merged History Encounter **       Family History  Problem Relation Age of Onset  . Colon cancer Neg Hx       VITAL SIGNS BP 133/69   Pulse 74   Temp (!) 97.3 F (36.3 C)   Resp 18   Ht 6' (1.829 m)   Wt 133 lb 6.4 oz (60.5 kg)   BMI 18.09 kg/m   Outpatient Encounter Medications as of 03/27/2019  Medication Sig  . calcium carbonate (TUMS EX) 750 MG chewable tablet Chew 1 tablet by mouth 2 (two) times daily.  . carvedilol (COREG) 3.125 MG tablet Take 1 tablet (3.125 mg total) by mouth 2 (two) times daily with a meal for 30 days.  Marland Kitchen docusate  sodium (COLACE) 100 MG capsule Take 1 capsule (100 mg total) by mouth 2 (two) times daily.  Marland Kitchen enoxaparin (LOVENOX) 40 MG/0.4ML injection Inject 0.4 mLs (40 mg total) into the skin daily.  Marland Kitchen levothyroxine (SYNTHROID) 75 MCG tablet Take 1 tablet (75 mcg total) by mouth daily for 30 days.  . NON FORMULARY Diet Type:  Dysphagia 3, Regular Liquids  . Nutritional Supplements (ENSURE ENLIVE PO) Take 1 Bottle by mouth daily.  . potassium chloride SA (K-DUR) 20 MEQ tablet Take 20 mEq by mouth daily.  . sodium chloride 0.9 % SOLN 75 cc pert hour intravenous for 2 liters after fluid bolus.  For acute dehydration  . vitamin B-12 (CYANOCOBALAMIN) 1000 MCG tablet Take 1,000 mcg by mouth daily.   No facility-administered encounter medications on file as of 03/27/2019.      SIGNIFICANT DIAGNOSTIC EXAMS   PREVIOUS;   03-09-19: right hip x-ray: Comminuted intertrochanteric fracture of the right femoral neck.  03-09-19: chest x-ray: no active disease   TODAY;   03-27-19: right upper quad ultrasound:  1. Increased echogenicity liver parenchyma and perivascular regions. The appearance is nonspecific but typically seen in inflammatory conditions such as hepatitis. 2. Slight prominence of the common bile duct without intrahepatic ductal dilatation or discrete stone. 3. Sludge in the gallbladder.    LABS REVIEWED PREVIOUS;   03-08-19: wbc 12.6; hgb 14.6; hct 44.8; mcv 100.7 ;plt 173; glucose 188; bun 28; creat 1.46; k+ 3.5; na++ 139; ca 8.7  03-09-19; wbc 10.5; hgb 13.2; hct 38.9; mcv 99.7; plt 157 glucose 168; bun 27; creat 1.21; k+ 3.7; na++ 141; ca 8.6 liver normal albumin 3.7 hgb a1c 5.3  03-10-19: glucose 130; bun 22; creat 1.14; k+ 3.5; an++ 137; ca 7.4  03-11-19: wbc 7.9; hgb 10.0; hct 30.4 mcv 1001.7; plt 133 03-15-19: wbc 6.2; hgb 10.4; hct 30.7; mcv 99.7; plt 207; glucose 95; bun 23; creat 0.86; k+ 3.3; na++ 137; ca 7.8; vit B 12: 300; BNP 195.0 tsh 4.413 03-21-19 k+ 3.9 03-26-19; wbc 9.9; hgb 13.8; hct  43.0; mcv 104.9; plt 286; glucose 163; bun 31; creat 1.16 ;k+ 4.2; na++ 137; ca 8.4 alk phos 192; total bili 2.0 albumin 3.3    TODAY;   03-27-19: glucose 113; bun 23; creat 0.84; k+ 3.9; na++ 140; ca 8.0; alk phos 179; total bili 1.6; direct bili 0.4; indirect bili 1.2 GGT 24 albumin 2.7  Review of Systems  Constitutional: Negative for malaise/fatigue.  Respiratory: Negative for cough and shortness of breath.   Cardiovascular: Negative for chest pain, palpitations and leg swelling.  Gastrointestinal: Negative for abdominal pain, constipation and heartburn.  Musculoskeletal: Negative for back pain, joint pain and myalgias.  Skin: Negative.   Neurological: Negative for dizziness.  Psychiatric/Behavioral: The patient is not nervous/anxious.     Physical Exam Constitutional:      General: He is not in acute distress.    Appearance: He is well-developed. He is not diaphoretic.  HENT:     Head:     Comments: History of vocal cord cancer removal  Neck:     Musculoskeletal: Neck supple.     Thyroid: No thyromegaly.  Cardiovascular:     Rate and Rhythm: Normal rate and regular rhythm.     Pulses: Normal pulses.     Heart sounds: Normal heart sounds.     Comments: Pace maker  Pulmonary:     Effort: Pulmonary effort is normal. No respiratory distress.     Breath sounds: Normal breath sounds.  Abdominal:     General: Bowel sounds are normal. There is no distension.     Palpations: Abdomen is soft.     Tenderness: There is no abdominal tenderness.  Genitourinary:    Comments: History of turp  Musculoskeletal:     Right lower leg: No edema.     Left lower leg: No edema.     Comments: Is able to move all extremities Is status post right hip fracture      Lymphadenopathy:     Cervical: No cervical adenopathy.  Skin:    General: Skin is warm and dry.     Comments: Bilateral lower extremities discolored   Neurological:     Mental Status: He is alert. Mental status is at baseline.   Psychiatric:        Mood and Affect: Mood normal.     ASSESSMENT/ PLAN:  TODAY;   1. Protein calorie malnutrition, severe 2. Closed nondisplaced intertrochanteric fracture right femur sequela 3. Elevated liver enzymes  Will begin prostat bid Will get hepatitis panel Will continue therapy as directed His goal is to return back home Will continue to monitor his status.       MD is aware of resident's narcotic use and is in agreement with current plan of care. We will attempt to wean resident as apropriate   [name] NP [facility]  Contact [phone] Monday through Friday 8am- 5pm  After hours call [phone]

## 2019-03-28 ENCOUNTER — Non-Acute Institutional Stay (SKILLED_NURSING_FACILITY): Payer: Medicare Other | Admitting: Adult Health

## 2019-03-28 ENCOUNTER — Encounter: Payer: Self-pay | Admitting: Adult Health

## 2019-03-28 DIAGNOSIS — K5909 Other constipation: Secondary | ICD-10-CM | POA: Diagnosis not present

## 2019-03-28 DIAGNOSIS — I6523 Occlusion and stenosis of bilateral carotid arteries: Secondary | ICD-10-CM | POA: Diagnosis not present

## 2019-03-28 DIAGNOSIS — G9001 Carotid sinus syncope: Secondary | ICD-10-CM

## 2019-03-28 LAB — HEPATITIS PANEL, ACUTE
HCV Ab: <0.1 s/co ratio (ref 0.0–0.9)
Hep A IgM: NEGATIVE
Hep B C IgM: NEGATIVE
Hepatitis B Surface Ag: NEGATIVE

## 2019-03-28 NOTE — Progress Notes (Signed)
Location:   [place] Room Number: 129 P Place of Service:  SNF (31)   CODE STATUS: DNR  Allergies  Allergen Reactions   Prevacid [Lansoprazole] Other (See Comments)    Does not remeber   Prilosec [Omeprazole] Other (See Comments)    Does not remeber   Codeine Other (See Comments)    Does not know    Penicillins Rash    Chief Complaint  Patient presents with   Medical Management of Chronic Issues       Chronic constipation: Carotid artery stenosis bilateral:Carotid sinus hypersensitivity   Weekly follow up for the first 30 days post hospitalization.     HPI:  He is a 85 year old short term rehab patient being seen for the management of his chronic illnesses; constipation; carotid artery stenosis; carotid sinus hypersensitivity. He denies any problems with constipation. He denies any uncontrolled pain. His liver enzymes have been elevated; his workup is negative. He denies any insomnia or anxiety.   Past Medical History:  Diagnosis Date   BPH (benign prostatic hyperplasia)    Carotid sinus hypersensitivity    Carotid stenosis, asymptomatic, bilateral    Dementia (HCC)    GERD (gastroesophageal reflux disease)    Hypertension    Hypothyroidism    Osteoporosis    PONV (postoperative nausea and vomiting)    Vocal cord cancer ([place])    at least 13 years ago    Past Surgical History:  Procedure Laterality Date   COLONOSCOPY  10/25/2012   Procedure: COLONOSCOPY;  Surgeon: Daneil Dolin, MD;  [facility];  Service: Endoscopy;  Laterality: N/A;  1:30   HERNIA REPAIR     INTRAMEDULLARY (IM) NAIL INTERTROCHANTERIC Right 03/09/2019   Procedure: INTRAMEDULLARY (IM) NAIL INTERTROCHANTRIC;  Surgeon: Carole Civil, MD;  Location: [facility] ORS;  Service: Orthopedics;  Laterality: Right;   NISSEN FUNDOPLICATION     PACEMAKER INSERTION     TRANSURETHRAL RESECTION OF PROSTATE     vocal cord cancer removal      Social  History   Socioeconomic History   Marital status: Married    Spouse name: Not on file   Number of children: Not on file   Years of education: Not on file   Highest education level: Not on file  Occupational History   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   Food insecurity    Worry: Not on file    Inability: Not on Lexicographer needs    Medical: Not on file    Non-medical: Not on file  Tobacco Use   Smoking status: Never Smoker   Smokeless tobacco: Never Used  Substance and Sexual Activity   Alcohol use: No    Frequency: Never   Drug use: No   Sexual activity: Not on file  Lifestyle   Physical activity    Days per week: Not on file    Minutes per session: Not on file   Stress: Not on file  Relationships   Social connections    Talks on phone: Not on file    Gets together: Not on file    Attends religious service: Not on file    Active member of club or organization: Not on file    Attends meetings of clubs or organizations: Not on file    Relationship status: Not on file   Intimate partner violence  Fear of current or ex partner: Not on file    Emotionally abused: Not on file    Physically abused: Not on file    Forced sexual activity: Not on file  Other Topics Concern   Not on file  Social History Narrative   ** Merged History Encounter **       Family History  Problem Relation Age of Onset   Colon cancer Neg Hx       VITAL SIGNS BP (!) 157/97    Pulse 89    Temp (!) 97.1 F (36.2 C)    Resp 20    Ht 6' (1.829 m)    Wt 135 lb 11.2 oz (61.6 kg)    BMI 18.40 kg/m   Outpatient Encounter Medications as of 03/28/2019  Medication Sig   Amino Acids-Protein Hydrolys (FEEDING SUPPLEMENT, PRO-STAT SUGAR FREE 64,) LIQD Take 30 mLs by mouth 2 (two) times daily between meals.   calcium carbonate (TUMS EX) 750 MG chewable tablet Chew 1 tablet by mouth 2 (two)  times daily.   carvedilol (COREG) 3.125 MG tablet Take 1 tablet (3.125 mg total) by mouth 2 (two) times daily with a meal for 30 days.   docusate sodium (COLACE) 100 MG capsule Take 1 capsule (100 mg total) by mouth 2 (two) times daily.   enoxaparin (LOVENOX) 40 MG/0.4ML injection Inject 0.4 mLs (40 mg total) into the skin daily.   levothyroxine (SYNTHROID) 75 MCG tablet Take 1 tablet (75 mcg total) by mouth daily for 30 days.   NON FORMULARY Diet Type: REGULAR   Nutritional Supplements (ENSURE ENLIVE PO) Take 1 Bottle by mouth daily.   potassium chloride SA (K-DUR) 20 MEQ tablet Take 20 mEq by mouth daily.   vitamin B-12 (CYANOCOBALAMIN) 1000 MCG tablet Take 1,000 mcg by mouth daily.   [DISCONTINUED] sodium chloride 0.9 % SOLN 75 cc pert hour intravenous for 2 liters after fluid bolus.  For acute dehydration   No facility-administered encounter medications on file as of 03/28/2019.      SIGNIFICANT DIAGNOSTIC EXAMS   PREVIOUS;   03-09-19: right hip x-ray: Comminuted intertrochanteric fracture of the right femoral neck.  03-09-19: chest x-ray: no active disease   TODAY;   03-27-19: right upper quad ultrasound:  1. Increased echogenicity liver parenchyma and perivascular regions. The appearance is nonspecific but typically seen in inflammatory conditions such as hepatitis. 2. Slight prominence of the common bile duct without intrahepatic ductal dilatation or discrete stone. 3. Sludge in the gallbladder.    LABS REVIEWED PREVIOUS;   03-08-19: wbc 12.6; hgb 14.6; hct 44.8; mcv 100.7 ;plt 173; glucose 188; bun 28; creat 1.46; k+ 3.5; na++ 139; ca 8.7  03-09-19; wbc 10.5; hgb 13.2; hct 38.9; mcv 99.7; plt 157 glucose 168; bun 27; creat 1.21; k+ 3.7; na++ 141; ca 8.6 liver normal albumin 3.7 hgb a1c 5.3  03-10-19: glucose 130; bun 22; creat 1.14; k+ 3.5; an++ 137; ca 7.4  03-11-19: wbc 7.9; hgb 10.0; hct 30.4 mcv 1001.7; plt 133 03-15-19: wbc 6.2; hgb 10.4; hct 30.7; mcv 99.7; plt 207;  glucose 95; bun 23; creat 0.86; k+ 3.3; na++ 137; ca 7.8; vit B 12: 300; BNP 195.0 tsh 4.413 03-21-19 k+ 3.9 03-26-19; wbc 9.9; hgb 13.8; hct 43.0; mcv 104.9; plt 286; glucose 163; bun 31; creat 1.16 ;k+ 4.2; na++ 137; ca 8.4 alk phos 192; total bili 2.0 albumin 3.3   03-27-19: glucose 113; bun 23; creat 0.84; k+ 3.9; na++ 140; ca 8.0; alk phos  179; total bili 1.6; direct bili 0.4; indirect bili 1.2 GGT 24 albumin 2.7 hepatitis panel: negative   NO NEW LABS.    Review of Systems  Constitutional: Negative for malaise/fatigue.  Respiratory: Negative for cough and shortness of breath.   Cardiovascular: Negative for chest pain, palpitations and leg swelling.  Gastrointestinal: Negative for abdominal pain, constipation and heartburn.  Musculoskeletal: Negative for back pain, joint pain and myalgias.  Skin: Negative.   Neurological: Negative for dizziness.  Psychiatric/Behavioral: The patient is not nervous/anxious.     Physical Exam Constitutional:      General: He is not in acute distress.    Appearance: He is underweight. He is not diaphoretic.  HENT:     Head:     Comments: History of vocal cord cancer removal  Neck:     Musculoskeletal: Neck supple.     Thyroid: No thyromegaly.  Cardiovascular:     Rate and Rhythm: Normal rate and regular rhythm.     Pulses: Normal pulses.     Heart sounds: Normal heart sounds.     Comments: Pace maker  Pulmonary:     Effort: Pulmonary effort is normal. No respiratory distress.     Breath sounds: Normal breath sounds.  Abdominal:     General: Bowel sounds are normal. There is no distension.     Palpations: Abdomen is soft.     Tenderness: There is no abdominal tenderness.  Genitourinary:    Comments: History of turp  Musculoskeletal:     Right lower leg: No edema.     Left lower leg: No edema.     Comments: Is able to move all extremities Is status post right hip fracture       Lymphadenopathy:     Cervical: No cervical adenopathy.    Skin:    General: Skin is warm and dry.     Comments: Bilateral lower extremities discolored    Neurological:     Mental Status: He is alert. Mental status is at baseline.  Psychiatric:        Mood and Affect: Mood normal.     ASSESSMENT/ PLAN:  TODAY:   1. Chronic constipation: is stable will continue colace twice daily   2. Carotid artery stenosis bilateral: (R 60-90%; L 40-59% 2018): is stable will monitor  3. Carotid sinus hypersensitivity is stable is stable post pace maker will monitor   4. Elevated liver enzymes: is stable his workup is negative for acute liver disease: will continue to monitor his status; will check hepatic in 2-3 weeks     PREVIOUS   5. Carcinoma of larynx: status post resection and radiation therapy 1995. Will monitor   6. GERD without esophagitis is stable will monitor   7. Hypocalcemia: is without change ca 8.0 will continue tums 750 mg twice daily   8. Acute blood loss anemia: hgb 10.4; will monitor   9. Dementia without behavioral disturbance; is without change: weight is 135 (previous 147  151)  pounds his weight has been stable since 03-26-19 will monitor   10. Closed nondisplaced intertrochanteric fracture of right femur sequela: is stable will continue therapy and will follow up with orthopedics; will continue lovenox 40 mg daily for total 35 days then asa 81 mg daily will monitor   11. Essential hypertension: is stable b/p 157/97: will continue coreg 3.125 mg twice daily   12. Hypothyroidism: unspecified type: is stable tsh 4.413 will continue synthroid 75 mcg daily     MD is  aware of resident's narcotic use and is in agreement with current plan of care. We will attempt to wean resident as apropriate   [name] NP [facility]  Contact [phone] Monday through Friday 8am- 5pm  After hours call [phone]

## 2019-03-29 DIAGNOSIS — R748 Abnormal levels of other serum enzymes: Secondary | ICD-10-CM | POA: Insufficient documentation

## 2019-03-29 DIAGNOSIS — R55 Syncope and collapse: Secondary | ICD-10-CM | POA: Insufficient documentation

## 2019-03-30 DIAGNOSIS — E43 Unspecified severe protein-calorie malnutrition: Secondary | ICD-10-CM | POA: Insufficient documentation

## 2019-04-02 ENCOUNTER — Encounter: Payer: Self-pay | Admitting: Adult Health

## 2019-04-02 ENCOUNTER — Other Ambulatory Visit (HOSPITAL_COMMUNITY)
Admission: RE | Admit: 2019-04-02 | Discharge: 2019-04-02 | Disposition: A | Discharge status: Home / Self Care | Payer: Medicare Other | Source: Ambulatory Visit | Attending: Internal Medicine | Admitting: Internal Medicine

## 2019-04-02 DIAGNOSIS — E860 Dehydration: Secondary | ICD-10-CM | POA: Insufficient documentation

## 2019-04-02 LAB — CBC
HCT: 39.5 % (ref 39.0–52.0)
Hemoglobin: 13.3 g/dL (ref 13.0–17.0)
MCH: 34.7 pg — ABNORMAL HIGH (ref 26.0–34.0)
MCHC: 33.7 g/dL (ref 30.0–36.0)
MCV: 103.1 fL — ABNORMAL HIGH (ref 80.0–100.0)
Platelets: 200 10*3/uL (ref 150–400)
RBC: 3.83 MIL/uL — ABNORMAL LOW (ref 4.22–5.81)
RDW: 15.4 % (ref 11.5–15.5)
WBC: 7.1 10*3/uL (ref 4.0–10.5)
nRBC: 0 % (ref 0.0–0.2)

## 2019-04-02 LAB — BASIC METABOLIC PANEL
Anion gap: 9 (ref 5–15)
BUN: 32 mg/dL — ABNORMAL HIGH (ref 8–23)
CO2: 22 mmol/L (ref 22–32)
Calcium: 8 mg/dL — ABNORMAL LOW (ref 8.9–10.3)
Chloride: 108 mmol/L (ref 98–111)
Creatinine, Ser: 0.94 mg/dL (ref 0.61–1.24)
GFR calc Af Amer: >60 mL/min (ref >60)
GFR calc non Af Amer: >60 mL/min (ref >60)
Glucose, Bld: 108 mg/dL — ABNORMAL HIGH (ref 70–99)
Potassium: 3.6 mmol/L (ref 3.5–5.1)
Sodium: 139 mmol/L (ref 135–145)

## 2019-04-02 NOTE — Progress Notes (Signed)
Location:    [place] Room Number: 129/P Place of Service:  SNF (31)   CODE STATUS: DNR  Allergies  Allergen Reactions  . Prevacid [Lansoprazole] Other (See Comments)    Does not remeber  . Prilosec [Omeprazole] Other (See Comments)    Does not remeber  . Codeine Other (See Comments)    Does not know   . Penicillins Rash    Chief Complaint  Patient presents with  . Acute Visit    Poor by mouth intake    HPI:    Past Medical History:  Diagnosis Date  . BPH (benign prostatic hyperplasia)   . Carotid sinus hypersensitivity   . Carotid stenosis, asymptomatic, bilateral   . Dementia ([place])   . GERD (gastroesophageal reflux disease)   . Hypertension   . Hypothyroidism   . Osteoporosis   . PONV (postoperative nausea and vomiting)   . Vocal cord cancer (H[facility])    at least 13 years ago    Past Surgical History:  Procedure Laterality Date  . COLONOSCOPY  10/25/2012   Procedure: COLONOSCOPY;  Surgeon: Daneil Dolin, MD;  [facility];  Service: Endoscopy;  Laterality: N/A;  1:30  . HERNIA REPAIR    . INTRAMEDULLARY (IM) NAIL INTERTROCHANTERIC Right 03/09/2019   Procedure: INTRAMEDULLARY (IM) NAIL INTERTROCHANTRIC;  Surgeon: Carole Civil, MD;  Location: [facility]S;  Service: Orthopedics;  Laterality: Right;  . NISSEN FUNDOPLICATION    . PACEMAKER INSERTION    . TRANSURETHRAL RESECTION OF PROSTATE    . vocal cord cancer removal      Social History   Socioeconomic History  . Marital status: Married    Spouse name: Not on file  . Number of children: Not on file  . Years of education: Not on file  . Highest education level: Not on file  Occupational History  . 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 Food insecurity    Worry: Not on file    Inability: Not on file  . Transportation needs    Medical: Not on file    Non-medical: Not on file  Tobacco  Use  . Smoking status: Never Smoker  . Smokeless tobacco: Never Used  Substance and Sexual Activity  . Alcohol use: No    Frequency: Never  . Drug use: No  . Sexual activity: Not on file  Lifestyle  . Physical activity    Days per week: Not on file    Minutes per session: Not on file  . Stress: Not on file  Relationships  . Social Herbalist on phone: Not on file    Gets together: Not on file    Attends religious service: Not on file    Active member of club or organization: Not on file    Attends meetings of clubs or organizations: Not on file    Relationship status: Not on file  . Intimate partner violence    Fear of current or ex partner: Not on file    Emotionally abused: Not on file    Physically abused: Not on file    Forced sexual activity: Not on file  Other Topics Concern  . Not on file  Social History Narrative   ** Merged History Encounter **       Family History  Problem Relation Age of Onset  . Colon cancer Neg Hx  VITAL SIGNS BP (!) 125/59   Pulse 79   Temp (!) 97.5 F (36.4 C) (Oral)   Resp 20   Ht 6' (1.829 m)   Wt 133 lb 9.6 oz (60.6 kg)   BMI 18.12 kg/m   Outpatient Encounter Medications as of 04/02/2019  Medication Sig  . Amino Acids-Protein Hydrolys (FEEDING SUPPLEMENT, PRO-STAT SUGAR FREE 64,) LIQD Take 30 mLs by mouth 2 (two) times daily between meals.  . calcium carbonate (TUMS EX) 750 MG chewable tablet Chew 1 tablet by mouth 2 (two) times daily.  . carvedilol (COREG) 3.125 MG tablet Take 1 tablet (3.125 mg total) by mouth 2 (two) times daily with a meal for 30 days.  Marland Kitchen docusate sodium (COLACE) 100 MG capsule Take 1 capsule (100 mg total) by mouth 2 (two) times daily.  Marland Kitchen enoxaparin (LOVENOX) 40 MG/0.4ML injection Inject 40 mg into the skin daily.  Marland Kitchen levothyroxine (SYNTHROID) 75 MCG tablet Take 75 mcg by mouth daily before breakfast.  . NON FORMULARY Diet Type: REGULAR  . Nutritional Supplements (ENSURE ENLIVE PO) Take 1  Bottle by mouth daily.  . potassium chloride SA (K-DUR) 20 MEQ tablet Take 20 mEq by mouth daily.  . vitamin B-12 (CYANOCOBALAMIN) 1000 MCG tablet Take 1,000 mcg by mouth daily.  . [DISCONTINUED] enoxaparin (LOVENOX) 40 MG/0.4ML injection Inject 0.4 mLs (40 mg total) into the skin daily.  . [DISCONTINUED] levothyroxine (SYNTHROID) 75 MCG tablet Take 1 tablet (75 mcg total) by mouth daily for 30 days.   No facility-administered encounter medications on file as of 04/02/2019.      SIGNIFICANT DIAGNOSTIC EXAMS       ASSESSMENT/ PLAN:    MD is aware of resident's narcotic use and is in agreement with current plan of care. We will attempt to wean resident as apropriate   [name] NP [facility]  Contact [phone] Monday through Friday 8am- 5pm  After hours call [phone]

## 2019-04-03 NOTE — Progress Notes (Signed)
This encounter was created in error - please disregard.

## 2019-04-04 ENCOUNTER — Encounter: Payer: Self-pay | Admitting: Adult Health

## 2019-04-04 ENCOUNTER — Non-Acute Institutional Stay (SKILLED_NURSING_FACILITY): Payer: Medicare Other | Admitting: Adult Health

## 2019-04-04 DIAGNOSIS — D62 Acute posthemorrhagic anemia: Secondary | ICD-10-CM | POA: Diagnosis not present

## 2019-04-04 DIAGNOSIS — F0390 Unspecified dementia without behavioral disturbance: Secondary | ICD-10-CM

## 2019-04-04 DIAGNOSIS — R634 Abnormal weight loss: Secondary | ICD-10-CM

## 2019-04-04 NOTE — Progress Notes (Signed)
Location:    [facility] Room Number: 129/P Place of Service:  SNF (31)   CODE STATUS: DNR  Allergies  Allergen Reactions  . Prevacid [Lansoprazole] Other (See Comments)    Does not remeber  . Prilosec [Omeprazole] Other (See Comments)    Does not remeber  . Codeine Other (See Comments)    Does not know   . Penicillins Rash    Chief Complaint  Patient presents with  . Medical Management of Chronic Issues       Hypocalcemia Acute blood loss anemia:   Dementia without behavioral disturbance:  Weight loss:  Weekly follow up for the first 30 days post hospitalization.     HPI:  He is a 85 year old short term rehab patient being seen for the management of his chronic illnesses; hypocalcemia; anemia; weight loss; dementia. His appetite remains poor with weight loss. He tells me that he cannot eat large amounts of food without throwing up the food gets stuck. He denies any uncontrolled. There are no reports of fevers present.   Past Medical History:  Diagnosis Date  . BPH (benign prostatic hyperplasia)   . Carotid sinus hypersensitivity   . Carotid stenosis, asymptomatic, bilateral   . Dementia ([place])   . GERD (gastroesophageal reflux disease)   . Hypertension   . Hypothyroidism   . Osteoporosis   . PONV (postoperative nausea and vomiting)   . Vocal cord cancer ([facility])    at least 13 years ago    Past Surgical History:  Procedure Laterality Date  . COLONOSCOPY  10/25/2012   Procedure: COLONOSCOPY;  Surgeon: Daneil Dolin, MD;  Location: [facility];  Service: Endoscopy;  Laterality: N/A;  1:30  . HERNIA REPAIR    . INTRAMEDULLARY (IM) NAIL INTERTROCHANTERIC Right 03/09/2019   Procedure: INTRAMEDULLARY (IM) NAIL INTERTROCHANTRIC;  Surgeon: Carole Civil, MD;  Location: [facility]S;  Service: Orthopedics;  Laterality: Right;  . NISSEN FUNDOPLICATION    . PACEMAKER INSERTION    . TRANSURETHRAL RESECTION OF PROSTATE    . vocal cord cancer removal      Social History   Socioeconomic History  . Marital status: Married    Spouse name: Not on file  . Number of children: Not on file  . Years of education: Not on file  . Highest education level: Not on file  Occupational History  . 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 Food insecurity    Worry: Not on file    Inability: Not on file  . Transportation needs    Medical: Not on file    Non-medical: Not on file  Tobacco Use  . Smoking status: Never Smoker  . Smokeless tobacco: Never Used  Substance and Sexual Activity  . Alcohol use: No    Frequency: Never  . Drug use: No  . Sexual activity: Not on file  Lifestyle  . Physical activity    Days per week: Not on file    Minutes per session: Not on file  . Stress: Not on file  Relationships  . Social Herbalist on phone: Not on file    Gets together: Not on file    Attends religious service: Not on file    Active member of club or organization: Not on file    Attends meetings of clubs or organizations: Not on file  Relationship status: Not on file  . Intimate partner violence    Fear of current or ex partner: Not on file    Emotionally abused: Not on file    Physically abused: Not on file    Forced sexual activity: Not on file  Other Topics Concern  . Not on file  Social History Narrative   ** Merged History Encounter **       Family History  Problem Relation Age of Onset  . Colon cancer Neg Hx       VITAL SIGNS BP 125/67   Pulse 71   Temp (!) 97.1 F (36.2 C) (Oral)   Resp 16   Ht 6' (1.829 m)   Wt 133 lb 9.6 oz (60.6 kg)   BMI 18.12 kg/m   Outpatient Encounter Medications as of 04/04/2019  Medication Sig  . Amino Acids-Protein Hydrolys (FEEDING SUPPLEMENT, PRO-STAT SUGAR FREE 64,) LIQD Take 30 mLs by mouth 2 (two) times daily between meals.  . calcium carbonate (TUMS EX) 750 MG chewable tablet Chew 1 tablet by  mouth 2 (two) times daily.  . carvedilol (COREG) 3.125 MG tablet Take 1 tablet (3.125 mg total) by mouth 2 (two) times daily with a meal for 30 days.  Marland Kitchen docusate sodium (COLACE) 100 MG capsule Take 1 capsule (100 mg total) by mouth 2 (two) times daily.  Marland Kitchen enoxaparin (LOVENOX) 40 MG/0.4ML injection Inject 40 mg into the skin daily.  Marland Kitchen levothyroxine (SYNTHROID) 75 MCG tablet Take 75 mcg by mouth daily before breakfast.  . NON FORMULARY Diet Type: REGULAR  . Nutritional Supplements (ENSURE ENLIVE PO) Take 1 Bottle by mouth daily.  . potassium chloride SA (K-DUR) 20 MEQ tablet Take 20 mEq by mouth daily.  . vitamin B-12 (CYANOCOBALAMIN) 1000 MCG tablet Take 1,000 mcg by mouth daily.   No facility-administered encounter medications on file as of 04/04/2019.      SIGNIFICANT DIAGNOSTIC EXAMS   PREVIOUS;   03-09-19: right hip x-ray: Comminuted intertrochanteric fracture of the right femoral neck.  03-09-19: chest x-ray: no active disease   03-27-19: right upper quad ultrasound:  1. Increased echogenicity liver parenchyma and perivascular regions. The appearance is nonspecific but typically seen in inflammatory conditions such as hepatitis. 2. Slight prominence of the common bile duct without intrahepatic ductal dilatation or discrete stone. 3. Sludge in the gallbladder.  NO NEW EXAMS.     LABS REVIEWED PREVIOUS;   03-08-19: wbc 12.6; hgb 14.6; hct 44.8; mcv 100.7 ;plt 173; glucose 188; bun 28; creat 1.46; k+ 3.5; na++ 139; ca 8.7  03-09-19; wbc 10.5; hgb 13.2; hct 38.9; mcv 99.7; plt 157 glucose 168; bun 27; creat 1.21; k+ 3.7; na++ 141; ca 8.6 liver normal albumin 3.7 hgb a1c 5.3  03-10-19: glucose 130; bun 22; creat 1.14; k+ 3.5; an++ 137; ca 7.4  03-11-19: wbc 7.9; hgb 10.0; hct 30.4 mcv 1001.7; plt 133 03-15-19: wbc 6.2; hgb 10.4; hct 30.7; mcv 99.7; plt 207; glucose 95; bun 23; creat 0.86; k+ 3.3; na++ 137; ca 7.8; vit B 12: 300; BNP 195.0 tsh 4.413 03-21-19 k+ 3.9 03-26-19; wbc 9.9; hgb 13.8;  hct 43.0; mcv 104.9; plt 286; glucose 163; bun 31; creat 1.16 ;k+ 4.2; na++ 137; ca 8.4 alk phos 192; total bili 2.0 albumin 3.3   03-27-19: glucose 113; bun 23; creat 0.84; k+ 3.9; na++ 140; ca 8.0; alk phos 179; total bili 1.6; direct bili 0.4; indirect bili 1.2 GGT 24 albumin 2.7 hepatitis panel: negative   TODAY;  04-02-19 wbc 7.1; hgb 13.3; hct 39.5; mcv 103.1; plt 200; glucose 108; bun 32; creat 0.94; k+ 3.6; an++ 139; ca 8.0    Review of Systems  Constitutional: Negative for malaise/fatigue.  Respiratory: Negative for cough and shortness of breath.   Cardiovascular: Negative for chest pain, palpitations and leg swelling.  Gastrointestinal: Negative for abdominal pain, constipation and heartburn.       Poor appetite   Musculoskeletal: Negative for back pain, joint pain and myalgias.  Skin: Negative.   Neurological: Negative for dizziness.  Psychiatric/Behavioral: The patient is not nervous/anxious.      Physical Exam Constitutional:      General: He is not in acute distress.    Appearance: He is well-developed. He is not diaphoretic.  HENT:     Mouth/Throat:     Comments: History of vocal cord cancer removal  Neck:     Musculoskeletal: Neck supple.     Thyroid: No thyromegaly.  Cardiovascular:     Rate and Rhythm: Normal rate and regular rhythm.     Pulses: Normal pulses.     Heart sounds: Normal heart sounds.     Comments: Pace maker  Pulmonary:     Effort: Pulmonary effort is normal. No respiratory distress.     Breath sounds: Normal breath sounds.  Abdominal:     General: Bowel sounds are normal. There is no distension.     Palpations: Abdomen is soft.     Tenderness: There is no abdominal tenderness.  Genitourinary:    Comments: History of turp  Musculoskeletal:     Right lower leg: No edema.     Left lower leg: No edema.     Comments: : Is able to move all extremities Is status post right hip fracture    Lymphadenopathy:     Cervical: No cervical  adenopathy.  Skin:    General: Skin is warm and dry.     Comments: Bilateral lower extremities discolored   Neurological:     General: No focal deficit present.     Mental Status: He is alert.  Psychiatric:        Mood and Affect: Mood normal.       ASSESSMENT/ PLAN:  TODAY:   1. Hypocalcemia:is stable ca 8.0 will continue tums 750 mg twice daily   2. Acute blood loss anemia: is stable hgb 13.3 will monitor   3. Dementia without behavioral disturbance: is losing weight: weight is 133 (previous 135, 147, 151) pounds; his appetite remains poor will continue to monitor his status.   4. Weight loss:  Will have him be seen by GI for esophageal stricture not allowing him to eat complete meals.   PREVIOUS   5. Carcinoma of larynx: status post resection and radiation therapy 1995. Will monitor   6. GERD without esophagitis is stable will monitor    7. Closed nondisplaced intertrochanteric fracture of right femur sequela: is stable will continue therapy and will follow up with orthopedics; will continue lovenox 40 mg daily for total 35 days then asa 81 mg daily will monitor   8. Essential hypertension: is stable b/p 125/67: will continue coreg 3.125 mg twice daily   9. Hypothyroidism: unspecified type: is stable tsh 4.413 will continue synthroid 75 mcg daily   10. Chronic constipation: is stable will continue colace twice daily   11. Carotid artery stenosis bilateral: (R 60-90%; L 40-59% 2018): is stable will monitor  12. Carotid sinus hypersensitivity is stable is stable post pace maker will  monitor   13. Elevated liver enzymes: is stable his workup is negative for acute liver disease: will continue to monitor his status; will check hepatic in 2-3 weeks     MD is aware of resident's narcotic use and is in agreement with current plan of care. We will attempt to wean resident as apropriate   [name] NP [facility]  Contact [phone] Monday through Friday  8am- 5pm  After hours call [phone]

## 2019-04-07 DIAGNOSIS — R634 Abnormal weight loss: Secondary | ICD-10-CM | POA: Insufficient documentation

## 2019-04-09 ENCOUNTER — Ambulatory Visit (INDEPENDENT_AMBULATORY_CARE_PROVIDER_SITE_OTHER): Payer: Medicare Other | Admitting: Gastroenterology

## 2019-04-09 ENCOUNTER — Telehealth: Payer: Self-pay | Admitting: Gastroenterology

## 2019-04-09 ENCOUNTER — Encounter: Payer: Self-pay | Admitting: Gastroenterology

## 2019-04-09 VITALS — BP 100/69 | HR 78 | Temp 97.3°F | Ht 70.0 in | Wt 129.8 lb

## 2019-04-09 DIAGNOSIS — I6523 Occlusion and stenosis of bilateral carotid arteries: Secondary | ICD-10-CM

## 2019-04-09 DIAGNOSIS — R634 Abnormal weight loss: Secondary | ICD-10-CM

## 2019-04-09 DIAGNOSIS — R630 Anorexia: Secondary | ICD-10-CM | POA: Insufficient documentation

## 2019-04-09 DIAGNOSIS — R1310 Dysphagia, unspecified: Secondary | ICD-10-CM | POA: Diagnosis not present

## 2019-04-09 NOTE — H&P (View-Only) (Signed)
Primary Care Physician:  [name], MD  Primary Gastroenterologist:  Garfield Cornea, MD   Chief Complaint  Patient presents with  . Weight Loss  . Anorexia    HPI:  Phillip Taylor is a 85 y.o. male here at the request of [name], NP at [facility] for further evaluation of weight loss, poor appetite, ?esophageal stricture contributing to his eating issues.  Patient in short-term rehabilitation after suffering hip fracture requiring surgery on March 09, 2019.  Patient's son, Phillip Taylor, presents with him today due to his dementia.  Prior to hip fracture, patient was living at home next door to several family members.  Patient was doing well, eating 3 meals daily with no evidence of poor appetite or weight loss.  He has a remote history of vocal cord cancer. Son reports since surgery/treatment of the cancer he has had some minor issues with swallowing.  Typically would have to chew his food thoroughly, swallow small boluses and take his time and he would do fine. Occasionally he eats too fast and food would get stuck and he would have to wait for the "air bubble" to pass.   Since being at the [facility], patient states he has not eaten well although he does not like the food. He reports there is no flavor.  He has baseline dementia and son states his history can be somewhat unreliable at times.  Has had more issues with memory since his hospitalization.  Patient denies postprandial nausea or vomiting.  He denies any abdominal pain.  He reports that his stools have been normal.  He reports today that he is not having any significant issues swallowing.  Nothing different from his baseline.  There is been no report of melena or rectal bleeding.  Hemoglobin dropped postoperatively but returned to normal.   Right upper quadrant ultrasound June 24 due to abnormal LFTs: CBD 6.8 mm, slightly above normal limits, no intrahepatic biliary dilatation, negative sonographic Murphy sign, sludge in the  gallbladder, increased echogenicity of liver parenchyma and perivascular regions, nonspecific but typically seen in inflammatory conditions such as hepatitis.  Acute viral hepatitis panel was negative.  LFTs normal on March 09, 2019.  Underwent surgical repair of hip fracture March 09, 2019.  Next LFTs performed June 23, alk phos 192, total bilirubin 2, normal transaminases.  04/26/2019 alk phos 179, total bilirubin 1.6, normal transaminases.  GGT normal.  Weight: 03/10/19: 152.3 lbs 03/11/19: 151.9 lbs 03/25/19: 135.2 lbs 04/03/19: 133.6 lbs 04/08/19: 129.8 lb  On lovenox until 04/14/19  Patient has a history of adenomatous colon polyps, last colonoscopy in 2014.  At that time he was advised that he should consider colonoscopy in 2017.  Based on age, family decided they did not want to pursue colonoscopy.  Last seen by his ENT 12/2016. "Laryngoscopy at that time:Stable laryngeal exam with no evidence of disease. The left vocal fold is stiff, there is a mild gap and retained secretions. If he would like to make voice improvements we discussed an injection augmentation. There is no evidence of laryngopharyngeal reflux and we discussed a holiday off omeprazole but he would like to continue taking it QD. There is a mild cerumen impaction in the right ear.  "   Current Outpatient Medications on File Prior to Visit  Medication Sig Dispense Refill  . Amino Acids-Protein Hydrolys (FEEDING SUPPLEMENT, PRO-STAT SUGAR FREE 64,) LIQD Take 30 mLs by mouth 2 (two) times daily between meals.    . calcium carbonate (TUMS  EX) 750 MG chewable tablet Chew 1 tablet by mouth 2 (two) times daily.    . carvedilol (COREG) 3.125 MG tablet Take 1 tablet (3.125 mg total) by mouth 2 (two) times daily with a meal for 30 days. 60 tablet 0  . docusate sodium (COLACE) 100 MG capsule Take 1 capsule (100 mg total) by mouth 2 (two) times daily. 10 capsule 0  . enoxaparin (LOVENOX) 40 MG/0.4ML injection Inject 40 mg into the skin daily.    Marland Kitchen  levothyroxine (SYNTHROID) 75 MCG tablet Take 75 mcg by mouth daily before breakfast.    . potassium chloride SA (K-DUR) 20 MEQ tablet Take 20 mEq by mouth daily.    . vitamin B-12 (CYANOCOBALAMIN) 1000 MCG tablet Take 1,000 mcg by mouth daily.    . NON FORMULARY Diet Type: REGULAR    . Nutritional Supplements (ENSURE ENLIVE PO) Take 1 Bottle by mouth daily.     No current facility-administered medications on file prior to visit.      Allergies as of 04/09/2019 - Review Complete 04/09/2019  Allergen Reaction Noted  . Prevacid [lansoprazole] Other (See Comments) 10/17/2012  . Prilosec [omeprazole] Other (See Comments) 10/17/2012  . Codeine Other (See Comments) 10/17/2012  . Penicillins Rash 10/17/2012    Past Medical History:  Diagnosis Date  . BPH (benign prostatic hyperplasia)   . Carotid sinus hypersensitivity   . Carotid stenosis, asymptomatic, bilateral   . Dementia ([place])   . GERD (gastroesophageal reflux disease)   . Hypertension   . Hypothyroidism   . Osteoporosis   . PONV (postoperative nausea and vomiting)   . Vocal cord cancer ([place]) 1995   cancer of the larynx s/p resection and XRT    Past Surgical History:  Procedure Laterality Date  . COLONOSCOPY  10/25/2012   Dr. Gala Romney: diverticulosis, multiple colon tubular adenomas removed. next TCS in 3 years.   Marland Kitchen HERNIA REPAIR    . INTRAMEDULLARY (IM) NAIL INTERTROCHANTERIC Right 03/09/2019   Procedure: INTRAMEDULLARY (IM) NAIL INTERTROCHANTRIC;  Surgeon: Carole Civil, MD;  Location: [facility];  Service: Orthopedics;  Laterality: Right;  . NISSEN FUNDOPLICATION    . PACEMAKER INSERTION    . TRANSURETHRAL RESECTION OF PROSTATE    . vocal cord cancer removal  1995    Family History  Problem Relation Age of Onset  . Colon cancer Neg Hx     Social History   Socioeconomic History  . Marital status: Married    Spouse name: Not on file  . Number of children: Not on file  . Years of education: Not on file  . Highest  education level: Not on file  Occupational History  . 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 Food insecurity    Worry: Not on file    Inability: Not on file  . Transportation needs    Medical: Not on file    Non-medical: Not on file  Tobacco Use  . Smoking status: Never Smoker  . Smokeless tobacco: Never Used  Substance and Sexual Activity  . Alcohol use: No    Frequency: Never  . Drug use: No  . Sexual activity: Not on file  Lifestyle  . Physical activity    Days per week: Not on file    Minutes per session: Not on file  . Stress: Not on file  Relationships  . Social Herbalist on phone: Not  on file    Gets together: Not on file    Attends religious service: Not on file    Active member of club or organization: Not on file    Attends meetings of clubs or organizations: Not on file    Relationship status: Not on file  . Intimate partner violence    Fear of current or ex partner: Not on file    Emotionally abused: Not on file    Physically abused: Not on file    Forced sexual activity: Not on file  Other Topics Concern  . Not on file  Social History Narrative   ** Merged History Encounter **          ROS: Questionable reliability  General: Negative for fever, chills.  See HPI  eyes: Negative for vision changes.  ENT: Negative for hoarseness,  nasal congestion.  Patient has hard of hearing.  See HPI CV: Negative for chest pain, angina, palpitations, dyspnea on exertion, peripheral edema.  Respiratory: Negative for dyspnea at rest, dyspnea on exertion, cough, sputum, wheezing.  GI: See history of present illness. GU:  Negative for dysuria, hematuria, urinary incontinence, urinary frequency, nocturnal urination.  MS: Negative for joint pain, low back pain.  Derm: Negative for rash or itching.  Neuro: Negative for weakness, abnormal sensation, seizure, frequent headaches,  positive memory loss, Psych: Negative for anxiety, depression, suicidal ideation, hallucinations.  Endo: See HPI Heme: Negative for bruising or bleeding. Allergy: Negative for rash or hives.    Physical Examination:  BP 100/69   Pulse 78   Temp (!) 97.3 F (36.3 C) (Oral)   Ht _0  (1.778 m)   Wt 129 lb 12.8 oz (58.9 kg) Comment: per nursing home records  BMI 18.62 kg/m    General: Thin elderly Caucasian male in no acute distress.  Accompanied by son who provides majority of the history due to patient's dementia. Head: Normocephalic, atraumatic.   Eyes: Conjunctiva pink, no icterus. Mouth: Oropharyngeal mucosa moist and pink , no lesions erythema or exudate. Neck: Supple without thyromegaly, masses, or lymphadenopathy.  Lungs: Clear to auscultation bilaterally.  Heart: Regular rate and rhythm, no murmurs rubs or gallops.  Abdomen: Bowel sounds are normal, nontender, nondistended, no hepatosplenomegaly or masses, no abdominal bruits or    hernia , no rebound or guarding.   Rectal: Not performed Extremities: No lower extremity edema. No clubbing or deformities.  Neuro: Alert and oriented x 4 , grossly normal neurologically.  Skin: Warm and dry, no rash or jaundice.   Psych: Alert and cooperative, normal mood and affect.  Labs: Lab Results  Component Value Date   WBC 7.1 04/02/2019   HGB 13.3 04/02/2019   HCT 39.5 04/02/2019   MCV 103.1 (H) 04/02/2019   PLT 200 04/02/2019   Lab Results  Component Value Date   CREATININE 0.94 04/02/2019   BUN 32 (H) 04/02/2019   NA 139 04/02/2019   K 3.6 04/02/2019   CL 108 04/02/2019   CO2 22 04/02/2019   Lab Results  Component Value Date   ALT 21 03/27/2019   AST 32 03/27/2019   ALKPHOS 179 (H) 03/27/2019   BILITOT 1.6 (H) 03/27/2019   Lab Results  Component Value Date   VITAMINB12 300 03/15/2019   No results found for: FOLATE  Acute hep panel negative Lab Results  Component Value Date   TSH 4.413 03/15/2019     Labs from 03/09/19: alk phos 82, tot bili 1.1. on 6/23 ap 192, tot bili  2.0  Imaging Studies: US Abdomen Limited Ruq  Result Date: 03/27/2019 CLINICAL DATA:  Elevated liver enzymes. EXAM: ULTRASOUND ABDOMEN LIMITED RIGHT UPPER QUADRANT COMPARISON:  None. FINDINGS: Gallbladder: Sludge in the fundus of the gallbladder. No gallbladder wall thickening. Negative sonographic Murphy's sign. Common bile duct: Diameter: 6.8 mm, slightly above normal limits. No dilated intrahepatic bile ducts. Liver: No focal lesion identified. Slightly increased echogenicity of the parenchyma with increased perivascular echogenicity. Portal vein is patent on color Doppler imaging with normal direction of blood flow towards the liver. IMPRESSION: 1. Increased echogenicity liver parenchyma and perivascular regions. The appearance is nonspecific but typically seen in inflammatory conditions such as hepatitis. 2. Slight prominence of the common bile duct without intrahepatic ductal dilatation or discrete stone. 3. Sludge in the gallbladder. Electronically Signed   By: Lorriane Shire M.D.   On: 03/27/2019 10:16

## 2019-04-09 NOTE — Telephone Encounter (Signed)
Per RMR, add patient on for EGD+/-ED with propofol for Thursday 04/11/19.   PLEASE FIND OUT WHAT TIME OF DAY LOVENOX IS GIVEN TO PATIENT. HE WILL NEED TO HOLD FOR 12 HOURS BEFORE HIS EGD.

## 2019-04-09 NOTE — Progress Notes (Signed)
Primary Care Physician:  [name], MD  Primary Gastroenterologist:  Garfield Cornea, MD   Chief Complaint  Patient presents with  . Weight Loss  . Anorexia    HPI:  Phillip Taylor is a 85 y.o. male here at the request of [name], NP at [facility] for further evaluation of weight loss, poor appetite, ?esophageal stricture contributing to his eating issues.  Patient in short-term rehabilitation after suffering hip fracture requiring surgery on March 09, 2019.  Patient's son, [name], presents with him today due to his dementia.  Prior to hip fracture, patient was living at home next door to several family members.  Patient was doing well, eating 3 meals daily with no evidence of poor appetite or weight loss.  He has a remote history of vocal cord cancer. Son reports since surgery/treatment of the cancer he has had some minor issues with swallowing.  Typically would have to chew his food thoroughly, swallow small boluses and take his time and he would do fine. Occasionally he eats too fast and food would get stuck and he would have to wait for the "air bubble" to pass.   Since being at the [facility], patient states he has not eaten well although he does not like the food. He reports there is no flavor.  He has baseline dementia and son states his history can be somewhat unreliable at times.  Has had more issues with memory since his hospitalization.  Patient denies postprandial nausea or vomiting.  He denies any abdominal pain.  He reports that his stools have been normal.  He reports today that he is not having any significant issues swallowing.  Nothing different from his baseline.  There is been no report of melena or rectal bleeding.  Hemoglobin dropped postoperatively but returned to normal.   Right upper quadrant ultrasound June 24 due to abnormal LFTs: CBD 6.8 mm, slightly above normal limits, no intrahepatic biliary dilatation, negative sonographic Murphy sign, sludge in the  gallbladder, increased echogenicity of liver parenchyma and perivascular regions, nonspecific but typically seen in inflammatory conditions such as hepatitis.  Acute viral hepatitis panel was negative.  LFTs normal on March 09, 2019.  Underwent surgical repair of hip fracture March 09, 2019.  Next LFTs performed June 23, alk phos 192, total bilirubin 2, normal transaminases.  04/26/2019 alk phos 179, total bilirubin 1.6, normal transaminases.  GGT normal.  Weight: 03/10/19: 152.3 lbs 03/11/19: 151.9 lbs 03/25/19: 135.2 lbs 04/03/19: 133.6 lbs 04/08/19: 129.8 lb  On lovenox until 04/14/19  Patient has a history of adenomatous colon polyps, last colonoscopy in 2014.  At that time he was advised that he should consider colonoscopy in 2017.  Based on age, family decided they did not want to pursue colonoscopy.  Last seen by his ENT 12/2016. "Laryngoscopy at that time:Stable laryngeal exam with no evidence of disease. The left vocal fold is stiff, there is a mild gap and retained secretions. If he would like to make voice improvements we discussed an injection augmentation. There is no evidence of laryngopharyngeal reflux and we discussed a holiday off omeprazole but he would like to continue taking it QD. There is a mild cerumen impaction in the right ear.  "   Current Outpatient Medications on File Prior to Visit  Medication Sig Dispense Refill  . Amino Acids-Protein Hydrolys (FEEDING SUPPLEMENT, PRO-STAT SUGAR FREE 64,) LIQD Take 30 mLs by mouth 2 (two) times daily between meals.    . calcium carbonate (TUMS  EX) 750 MG chewable tablet Chew 1 tablet by mouth 2 (two) times daily.    . carvedilol (COREG) 3.125 MG tablet Take 1 tablet (3.125 mg total) by mouth 2 (two) times daily with a meal for 30 days. 60 tablet 0  . docusate sodium (COLACE) 100 MG capsule Take 1 capsule (100 mg total) by mouth 2 (two) times daily. 10 capsule 0  . enoxaparin (LOVENOX) 40 MG/0.4ML injection Inject 40 mg into the skin daily.    Marland Kitchen  levothyroxine (SYNTHROID) 75 MCG tablet Take 75 mcg by mouth daily before breakfast.    . potassium chloride SA (K-DUR) 20 MEQ tablet Take 20 mEq by mouth daily.    . vitamin B-12 (CYANOCOBALAMIN) 1000 MCG tablet Take 1,000 mcg by mouth daily.    . NON FORMULARY Diet Type: REGULAR    . Nutritional Supplements (ENSURE ENLIVE PO) Take 1 Bottle by mouth daily.     No current facility-administered medications on file prior to visit.      Allergies as of 04/09/2019 - Review Complete 04/09/2019  Allergen Reaction Noted  . Prevacid [lansoprazole] Other (See Comments) 10/17/2012  . Prilosec [omeprazole] Other (See Comments) 10/17/2012  . Codeine Other (See Comments) 10/17/2012  . Penicillins Rash 10/17/2012    Past Medical History:  Diagnosis Date  . BPH (benign prostatic hyperplasia)   . Carotid sinus hypersensitivity   . Carotid stenosis, asymptomatic, bilateral   . Dementia ([place])   . GERD (gastroesophageal reflux disease)   . Hypertension   . Hypothyroidism   . Osteoporosis   . PONV (postoperative nausea and vomiting)   . Vocal cord cancer ([place]) 1995   cancer of the larynx s/p resection and XRT    Past Surgical History:  Procedure Laterality Date  . COLONOSCOPY  10/25/2012   Dr. Gala Romney: diverticulosis, multiple colon tubular adenomas removed. next TCS in 3 years.   Marland Kitchen HERNIA REPAIR    . INTRAMEDULLARY (IM) NAIL INTERTROCHANTERIC Right 03/09/2019   Procedure: INTRAMEDULLARY (IM) NAIL INTERTROCHANTRIC;  Surgeon: Carole Civil, MD;  Location: [facility];  Service: Orthopedics;  Laterality: Right;  . NISSEN FUNDOPLICATION    . PACEMAKER INSERTION    . TRANSURETHRAL RESECTION OF PROSTATE    . vocal cord cancer removal  1995    Family History  Problem Relation Age of Onset  . Colon cancer Neg Hx     Social History   Socioeconomic History  . Marital status: Married    Spouse name: Not on file  . Number of children: Not on file  . Years of education: Not on file  . Highest  education level: Not on file  Occupational History  . 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 Food insecurity    Worry: Not on file    Inability: Not on file  . Transportation needs    Medical: Not on file    Non-medical: Not on file  Tobacco Use  . Smoking status: Never Smoker  . Smokeless tobacco: Never Used  Substance and Sexual Activity  . Alcohol use: No    Frequency: Never  . Drug use: No  . Sexual activity: Not on file  Lifestyle  . Physical activity    Days per week: Not on file    Minutes per session: Not on file  . Stress: Not on file  Relationships  . Social Herbalist on phone: Not  on file    Gets together: Not on file    Attends religious service: Not on file    Active member of club or organization: Not on file    Attends meetings of clubs or organizations: Not on file    Relationship status: Not on file  . Intimate partner violence    Fear of current or ex partner: Not on file    Emotionally abused: Not on file    Physically abused: Not on file    Forced sexual activity: Not on file  Other Topics Concern  . Not on file  Social History Narrative   ** Merged History Encounter **          ROS: Questionable reliability  General: Negative for fever, chills.  See HPI  eyes: Negative for vision changes.  ENT: Negative for hoarseness,  nasal congestion.  Patient has hard of hearing.  See HPI CV: Negative for chest pain, angina, palpitations, dyspnea on exertion, peripheral edema.  Respiratory: Negative for dyspnea at rest, dyspnea on exertion, cough, sputum, wheezing.  GI: See history of present illness. GU:  Negative for dysuria, hematuria, urinary incontinence, urinary frequency, nocturnal urination.  MS: Negative for joint pain, low back pain.  Derm: Negative for rash or itching.  Neuro: Negative for weakness, abnormal sensation, seizure, frequent headaches,  positive memory loss, Psych: Negative for anxiety, depression, suicidal ideation, hallucinations.  Endo: See HPI Heme: Negative for bruising or bleeding. Allergy: Negative for rash or hives.    Physical Examination:  BP 100/69   Pulse 78   Temp (!) 97.3 F (36.3 C) (Oral)   Ht _0  (1.778 m)   Wt 129 lb 12.8 oz (58.9 kg) Comment: per nursing home records  BMI 18.62 kg/m    General: Thin elderly Caucasian male in no acute distress.  Accompanied by son who provides majority of the history due to patient's dementia. Head: Normocephalic, atraumatic.   Eyes: Conjunctiva pink, no icterus. Mouth: Oropharyngeal mucosa moist and pink , no lesions erythema or exudate. Neck: Supple without thyromegaly, masses, or lymphadenopathy.  Lungs: Clear to auscultation bilaterally.  Heart: Regular rate and rhythm, no murmurs rubs or gallops.  Abdomen: Bowel sounds are normal, nontender, nondistended, no hepatosplenomegaly or masses, no abdominal bruits or    hernia , no rebound or guarding.   Rectal: Not performed Extremities: No lower extremity edema. No clubbing or deformities.  Neuro: Alert and oriented x 4 , grossly normal neurologically.  Skin: Warm and dry, no rash or jaundice.   Psych: Alert and cooperative, normal mood and affect.  Labs: Lab Results  Component Value Date   WBC 7.1 04/02/2019   HGB 13.3 04/02/2019   HCT 39.5 04/02/2019   MCV 103.1 (H) 04/02/2019   PLT 200 04/02/2019   Lab Results  Component Value Date   CREATININE 0.94 04/02/2019   BUN 32 (H) 04/02/2019   NA 139 04/02/2019   K 3.6 04/02/2019   CL 108 04/02/2019   CO2 22 04/02/2019   Lab Results  Component Value Date   ALT 21 03/27/2019   AST 32 03/27/2019   ALKPHOS 179 (H) 03/27/2019   BILITOT 1.6 (H) 03/27/2019   Lab Results  Component Value Date   VITAMINB12 300 03/15/2019   No results found for: FOLATE  Acute hep panel negative Lab Results  Component Value Date   TSH 4.413 03/15/2019     Labs from 03/09/19: alk phos 82, tot bili 1.1. on 6/23 ap 192, tot bili  2.0  Imaging Studies: US Abdomen Limited Ruq  Result Date: 03/27/2019 CLINICAL DATA:  Elevated liver enzymes. EXAM: ULTRASOUND ABDOMEN LIMITED RIGHT UPPER QUADRANT COMPARISON:  None. FINDINGS: Gallbladder: Sludge in the fundus of the gallbladder. No gallbladder wall thickening. Negative sonographic Murphy's sign. Common bile duct: Diameter: 6.8 mm, slightly above normal limits. No dilated intrahepatic bile ducts. Liver: No focal lesion identified. Slightly increased echogenicity of the parenchyma with increased perivascular echogenicity. Portal vein is patent on color Doppler imaging with normal direction of blood flow towards the liver. IMPRESSION: 1. Increased echogenicity liver parenchyma and perivascular regions. The appearance is nonspecific but typically seen in inflammatory conditions such as hepatitis. 2. Slight prominence of the common bile duct without intrahepatic ductal dilatation or discrete stone. 3. Sludge in the gallbladder. Electronically Signed   By: Lorriane Shire M.D.   On: 03/27/2019 10:16

## 2019-04-09 NOTE — Assessment & Plan Note (Addendum)
Very pleasant 85 y/o male with remote history of laryngeal cancer s/p resection/XRT who presents with his son, [name], for further evaluation of recent weight loss. Patient was doing well living at home along, although multiple family members ive next door. He was able to eat three meals a day, had a good appetite, with minor chronic swallowing concerns since remote throat surgery/XRT. He suffered a fall and fractured his hip requiring surgery on 03/09/19. He was discharged to [facility] for rehab postoperatively. Since that time, he has lost nearly 25 pounds. After patient left office today I was able to personally speak to one of his primary nurses. She reports that patient will often no attempt to eat most things brought to him and seems to be a food preference issues. He likes cereal so she will often bring him additional cereal/banana if he doesn't eat what he is originally served. He has had one episode we know of with food sticking and causes vomiting. He has baseline chronic manageable dysphagia.   Discussed at length with patient, son, Dr. Gala Romney.. Suspect weight loss due to worsening dementia in setting of acute clinical change, change in environment however would be reasonable to offer EGD+/-ED with propofol to evaluate for an esophageal stricture, gerd, pud, malignancy.  I have discussed the risks, alternatives, benefits with regards to but not limited to the risk of reaction to medication, bleeding, infection, perforation and the patient is agreeable to proceed. Written consent to be obtained.  WILL HOLD LOVENOX EVENING BEFORE PROCEDURE.   Hold off on PPI until after EGD. His allergy list include omeprazole and lansoprazole documented in 2014 although patient was on omeprazole when he saw ENT in 2018.

## 2019-04-09 NOTE — Patient Instructions (Addendum)
1. We will touch base with primary nurse and discuss eating/swallowing concerns.  2. Once discussed with Dr. Gala Romney, we will determine if upper endoscopy recommended. Discussed with patient's son, [name], who is in agreement with Dr. Roseanne Kaufman recommendations as seen necessary.  3. Please give patient encouragement to eat. He has been encouraged to eat for need of calories and to maintain weight. He prefers his home meals over meals provided at the nursing home do to lack of taste. Can outside food be allowed?

## 2019-04-10 ENCOUNTER — Other Ambulatory Visit: Payer: Self-pay

## 2019-04-10 ENCOUNTER — Non-Acute Institutional Stay (SKILLED_NURSING_FACILITY): Payer: Medicare Other | Admitting: Adult Health

## 2019-04-10 ENCOUNTER — Encounter (HOSPITAL_COMMUNITY)
Admit: 2019-04-10 | Discharge: 2019-04-10 | Disposition: A | Discharge status: Home / Self Care | Payer: Medicare Other | Attending: Internal Medicine | Admitting: Internal Medicine

## 2019-04-10 ENCOUNTER — Encounter (HOSPITAL_COMMUNITY): Payer: Self-pay

## 2019-04-10 ENCOUNTER — Ambulatory Visit (INDEPENDENT_AMBULATORY_CARE_PROVIDER_SITE_OTHER): Payer: Medicare Other | Admitting: Orthopedic Surgery

## 2019-04-10 ENCOUNTER — Inpatient Hospital Stay (INDEPENDENT_AMBULATORY_CARE_PROVIDER_SITE_OTHER): Payer: Medicare Other

## 2019-04-10 ENCOUNTER — Encounter: Payer: Self-pay | Admitting: Adult Health

## 2019-04-10 ENCOUNTER — Encounter: Payer: Self-pay | Admitting: Orthopedic Surgery

## 2019-04-10 VITALS — Temp 97.5°F

## 2019-04-10 DIAGNOSIS — S72001D Fracture of unspecified part of neck of right femur, subsequent encounter for closed fracture with routine healing: Secondary | ICD-10-CM

## 2019-04-10 DIAGNOSIS — I10 Essential (primary) hypertension: Secondary | ICD-10-CM

## 2019-04-10 DIAGNOSIS — R634 Abnormal weight loss: Secondary | ICD-10-CM

## 2019-04-10 DIAGNOSIS — F0390 Unspecified dementia without behavioral disturbance: Secondary | ICD-10-CM

## 2019-04-10 DIAGNOSIS — R1310 Dysphagia, unspecified: Secondary | ICD-10-CM

## 2019-04-10 DIAGNOSIS — S72144S Nondisplaced intertrochanteric fracture of right femur, sequela: Secondary | ICD-10-CM

## 2019-04-10 MED ORDER — PROMETHAZINE HCL 25 MG/ML IJ SOLN: 6.2500 mg | INTRAMUSCULAR | Status: DC | PRN

## 2019-04-10 MED ORDER — MEPERIDINE HCL 50 MG/ML IJ SOLN: 6.2500 mg | INTRAMUSCULAR | Status: DC | PRN

## 2019-04-10 MED ORDER — HYDROMORPHONE HCL 1 MG/ML IJ SOLN: 0.2500 mg | INTRAMUSCULAR | Status: DC | PRN

## 2019-04-10 MED ORDER — LACTATED RINGERS IV SOLN: INTRAVENOUS | Status: DC

## 2019-04-10 MED ORDER — HYDROCODONE-ACETAMINOPHEN 7.5-325 MG PO TABS: 1.0000 | ORAL_TABLET | Freq: Once | ORAL | Status: DC | PRN

## 2019-04-10 NOTE — Progress Notes (Signed)
Location:   [place] Room Number: 129 P Place of Service:  SNF (31)   CODE STATUS: DNR  Allergies  Allergen Reactions  . Prevacid [Lansoprazole] Other (See Comments)    Does not remeber  . Prilosec [Omeprazole] Other (See Comments)    Does not remeber  . Codeine Other (See Comments)    Does not know   . Penicillins Rash    Chief Complaint  Patient presents with  . Acute Visit    Care Plan Meeting    HPI:  We have come together for his care plan meeting to discuss his discharge plans. He does have family present via phone. He is going to see orthopedics today. He has been seen by GI and is due to have EGD in the AM. His appetite remains poor. He continues to participate in therapy. Speech therapy is working on a memory book for him; he has a poor ability to sequence tasks. He is able to ambulate. He will need 24 hour care upon his discharge to home. His goal remains to return back home.    Past Medical History:  Diagnosis Date  . BPH (benign prostatic hyperplasia)   . Carotid sinus hypersensitivity   . Carotid stenosis, asymptomatic, bilateral   . Dementia ([place])   . GERD (gastroesophageal reflux disease)   . Hypertension   . Hypothyroidism   . Osteoporosis   . PONV (postoperative nausea and vomiting)   . 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 COLONOSCOPY  10/25/2012   Dr. Gala Romney: diverticulosis, multiple colon tubular adenomas removed. next TCS in 3 years.   Marland Kitchen HERNIA REPAIR    . INTRAMEDULLARY (IM) NAIL INTERTROCHANTERIC Right 03/09/2019   Procedure: INTRAMEDULLARY (IM) NAIL INTERTROCHANTRIC;  Surgeon: Carole Civil, MD;  Location: [facility];  Service: Orthopedics;  Laterality: Right;  . NISSEN FUNDOPLICATION    . PACEMAKER INSERTION    . TRANSURETHRAL RESECTION OF PROSTATE    . vocal cord cancer removal  1995    Social History    Socioeconomic History  . Marital status: Married    Spouse name: Not on file  . Number of children: Not on file  . Years of education: Not on file  . Highest education level: Not on file  Occupational History  . 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 Food insecurity    Worry: Not on file    Inability: Not on file  . Transportation needs    Medical: Not on file    Non-medical: Not on file  Tobacco Use  . Smoking status: Never Smoker  . Smokeless tobacco: Never Used  Substance and Sexual Activity  . Alcohol use: No    Frequency: Never  . Drug use: No  . Sexual activity: Not on file  Lifestyle  . Physical activity    Days per week: Not on file    Minutes per session: Not on file  . Stress: Not on file  Relationships  . Social Herbalist on phone: Not on file    Gets together: Not on file    Attends religious service: Not on file    Active member of club or organization: Not on file    Attends meetings of clubs or organizations: Not on  file    Relationship status: Not on file  . Intimate partner violence    Fear of current or ex partner: Not on file    Emotionally abused: Not on file    Physically abused: Not on file    Forced sexual activity: Not on file  Other Topics Concern  . Not on file  Social History Narrative   ** Merged History Encounter **       Family History  Problem Relation Age of Onset  . Colon cancer Neg Hx       VITAL SIGNS BP 105/72   Pulse 75   Temp 98 F (36.7 C)   Ht 6' (1.829 m)   Wt 129 lb 12.8 oz (58.9 kg)   BMI 17.60 kg/m   Outpatient Encounter Medications as of 04/10/2019  Medication Sig  . Amino Acids-Protein Hydrolys (FEEDING SUPPLEMENT, PRO-STAT SUGAR FREE 64,) LIQD Take 30 mLs by mouth 2 (two) times daily between meals.  . calcium carbonate (TUMS EX) 750 MG chewable tablet Chew 1 tablet by mouth 2 (two) times daily.  . carvedilol  (COREG) 3.125 MG tablet Take 1 tablet (3.125 mg total) by mouth 2 (two) times daily with a meal for 30 days.  Marland Kitchen docusate sodium (COLACE) 100 MG capsule Take 1 capsule (100 mg total) by mouth 2 (two) times daily.  Marland Kitchen enoxaparin (LOVENOX) 40 MG/0.4ML injection Inject 40 mg into the skin daily.  Marland Kitchen levothyroxine (SYNTHROID) 75 MCG tablet Take 75 mcg by mouth daily before breakfast.  . NON FORMULARY Diet Type: REGULAR  . Nutritional Supplements (ENSURE ENLIVE PO) Take 1 Bottle by mouth daily.  . potassium chloride SA (K-DUR) 20 MEQ tablet Take 20 mEq by mouth daily.  . vitamin B-12 (CYANOCOBALAMIN) 1000 MCG tablet Take 1,000 mcg by mouth daily.   No facility-administered encounter medications on file as of 04/10/2019.      SIGNIFICANT DIAGNOSTIC EXAMS  PREVIOUS;   03-09-19: right hip x-ray: Comminuted intertrochanteric fracture of the right femoral neck.  03-09-19: chest x-ray: no active disease   03-27-19: right upper quad ultrasound:  1. Increased echogenicity liver parenchyma and perivascular regions. The appearance is nonspecific but typically seen in inflammatory conditions such as hepatitis. 2. Slight prominence of the common bile duct without intrahepatic ductal dilatation or discrete stone. 3. Sludge in the gallbladder.  NO NEW EXAMS.     LABS REVIEWED PREVIOUS;   03-08-19: wbc 12.6; hgb 14.6; hct 44.8; mcv 100.7 ;plt 173; glucose 188; bun 28; creat 1.46; k+ 3.5; na++ 139; ca 8.7  03-09-19; wbc 10.5; hgb 13.2; hct 38.9; mcv 99.7; plt 157 glucose 168; bun 27; creat 1.21; k+ 3.7; na++ 141; ca 8.6 liver normal albumin 3.7 hgb a1c 5.3  03-10-19: glucose 130; bun 22; creat 1.14; k+ 3.5; an++ 137; ca 7.4  03-11-19: wbc 7.9; hgb 10.0; hct 30.4 mcv 1001.7; plt 133 03-15-19: wbc 6.2; hgb 10.4; hct 30.7; mcv 99.7; plt 207; glucose 95; bun 23; creat 0.86; k+ 3.3; na++ 137; ca 7.8; vit B 12: 300; BNP 195.0 tsh 4.413 03-21-19 k+ 3.9 03-26-19; wbc 9.9; hgb 13.8; hct 43.0; mcv 104.9; plt 286; glucose 163;  bun 31; creat 1.16 ;k+ 4.2; na++ 137; ca 8.4 alk phos 192; total bili 2.0 albumin 3.3   03-27-19: glucose 113; bun 23; creat 0.84; k+ 3.9; na++ 140; ca 8.0; alk phos 179; total bili 1.6; direct bili 0.4; indirect bili 1.2 GGT 24 albumin 2.7 hepatitis panel: negative  04-02-19 wbc 7.1; hgb 13.3; hct  39.5; mcv 103.1; plt 200; glucose 108; bun 32; creat 0.94; k+ 3.6; an++ 139; ca 8.0  NO NEW LABS.     Review of Systems  Constitutional: Negative for malaise/fatigue.       Poor appetite   Respiratory: Negative for cough and shortness of breath.   Cardiovascular: Negative for chest pain, palpitations and leg swelling.  Gastrointestinal: Negative for abdominal pain, constipation and heartburn.  Musculoskeletal: Negative for back pain, joint pain and myalgias.  Skin: Negative.   Neurological: Negative for dizziness.  Psychiatric/Behavioral: The patient is not nervous/anxious.     Physical Exam Constitutional:      General: He is not in acute distress.    Appearance: He is underweight. He is not diaphoretic.  HENT:     Head:     Comments: History of vocal cord cancer removal  Neck:     Musculoskeletal: Neck supple.     Thyroid: No thyromegaly.  Cardiovascular:     Rate and Rhythm: Normal rate and regular rhythm.     Pulses: Normal pulses.     Heart sounds: Normal heart sounds.     Comments: Pace maker  Pulmonary:     Effort: Pulmonary effort is normal. No respiratory distress.     Breath sounds: Normal breath sounds.  Abdominal:     General: Bowel sounds are normal. There is no distension.     Palpations: Abdomen is soft.     Tenderness: There is no abdominal tenderness.  Genitourinary:    Comments: History of turp  Musculoskeletal:     Right lower leg: No edema.     Left lower leg: No edema.     Comments: Is able to move all extremities Is status post right hip fracture  Lymphadenopathy:     Cervical: No cervical adenopathy.  Skin:    General: Skin is warm and dry.      Comments: Bilateral lower extremities discolored   Neurological:     Mental Status: He is alert. Mental status is at baseline.  Psychiatric:        Mood and Affect: Mood normal.       ASSESSMENT/ PLAN:  TODAY:   1. Dementia without behavioral disturbance 2. Closed nondisplaced intertrochanteric fracture of right femur sequela 3. Essential hypertension.   He continues to lose weight; which may very well be related to his dementia and environmental changes Is due for EGD in the AM Will continue therapy as directed His goal is to return back home Will need 24 hour care at home.   MD is aware of resident's narcotic use and is in agreement with current plan of care. We will attempt to wean resident as apropriate   [name] NP [facility]  Contact [phone] Monday through Friday 8am- 5pm  After hours call [phone]

## 2019-04-10 NOTE — Progress Notes (Signed)
cc'ed to pcp °

## 2019-04-10 NOTE — Telephone Encounter (Signed)
EGD/poss DIL w/Propofol w/RMR scheduled for 04/11/19 at 2:00pm.   [facility] and spoke to nurse Jocelyn Lamer. Nursing home to do rapid COVID test tomorrow morning prior to procedure. Pt has private room. Advised for pt to stay in his room after COVID test until procedure. Instructions for EGD given. Advised may have clear liquids in am until 10:00am. NPO after 10:00am. He gets Lovenox in mornings. Advised her to hold Lovenox tomorrow morning d/t Lovenox to be held for 12 hours before procedure. Nursing home will inform pt's son of procedure. Pre-op nurse to call nurse today. Endo scheduler aware. Orders entered.

## 2019-04-10 NOTE — Progress Notes (Signed)
POST OP VISIT   Patient ID: Phillip Taylor, male   DOB: 1925-05-13, 85 y.o.   MRN: [id number]  Chief Complaint  Patient presents with  . Hip Pain    R/not hurting   03/09/2019 DOS POD 32 (4 WKS 4 DAYS)    Encounter Diagnosis  Name Primary?  . Closed fracture of right hip with routine healing, subsequent encounter Yes     S/P   short gamma nail  Findings at surgery:   Stable after reduction intratrochanteric fracture equivalent   Postoperative plan Weightbearing status as tolerated DVT prophylaxis for 35 days with Lovenox There are no sutures to take out the first postop appointment can be made 4 weeks after discharge unless there are any issues No hip precautions are needed  The patient is hard of hearing  Leg lengths are equal no rotatory alignment issue  His x-ray shows a stable implant with good tip to apex distance  Probably way to go home in 3 weeks Follow-up here in 8 weeks  3 WEEKS FU

## 2019-04-10 NOTE — Telephone Encounter (Signed)
thanks

## 2019-04-11 ENCOUNTER — Other Ambulatory Visit (HOSPITAL_COMMUNITY)
Admission: RE | Admit: 2019-04-11 | Discharge: 2019-04-11 | Disposition: A | Discharge status: Home / Self Care | Payer: Medicare Other | Source: Ambulatory Visit | Attending: Internal Medicine | Admitting: Internal Medicine

## 2019-04-11 ENCOUNTER — Ambulatory Visit (HOSPITAL_COMMUNITY): Payer: Medicare Other | Admitting: Anesthesiology

## 2019-04-11 ENCOUNTER — Other Ambulatory Visit: Payer: Self-pay

## 2019-04-11 ENCOUNTER — Encounter (HOSPITAL_COMMUNITY): Payer: Self-pay | Admitting: Anesthesiology

## 2019-04-11 ENCOUNTER — Ambulatory Visit (HOSPITAL_COMMUNITY)
Admission: RE | Admit: 2019-04-11 | Discharge: 2019-04-11 | Disposition: A | Discharge status: Skilled Nursing Facility | Payer: Medicare Other | Attending: Internal Medicine | Admitting: Internal Medicine

## 2019-04-11 ENCOUNTER — Inpatient Hospital Stay
Admission: RE | Admit: 2019-04-11 | Discharge: 2019-04-25 | Disposition: A | Discharge status: Home / Self Care | Payer: Medicare Other | Source: Ambulatory Visit | Attending: Internal Medicine | Admitting: Internal Medicine

## 2019-04-11 ENCOUNTER — Encounter (HOSPITAL_COMMUNITY)
Admission: RE | Disposition: A | Discharge status: Skilled Nursing Facility | Payer: Self-pay | Source: Home / Self Care | Attending: Internal Medicine

## 2019-04-11 ENCOUNTER — Encounter: Payer: Self-pay | Admitting: Internal Medicine

## 2019-04-11 DIAGNOSIS — K269 Duodenal ulcer, unspecified as acute or chronic, without hemorrhage or perforation: Secondary | ICD-10-CM | POA: Insufficient documentation

## 2019-04-11 DIAGNOSIS — K222 Esophageal obstruction: Secondary | ICD-10-CM | POA: Insufficient documentation

## 2019-04-11 DIAGNOSIS — K3189 Other diseases of stomach and duodenum: Secondary | ICD-10-CM | POA: Diagnosis not present

## 2019-04-11 DIAGNOSIS — K828 Other specified diseases of gallbladder: Secondary | ICD-10-CM | POA: Insufficient documentation

## 2019-04-11 DIAGNOSIS — Z1159 Encounter for screening for other viral diseases: Secondary | ICD-10-CM | POA: Insufficient documentation

## 2019-04-11 DIAGNOSIS — Z950 Presence of cardiac pacemaker: Secondary | ICD-10-CM | POA: Insufficient documentation

## 2019-04-11 DIAGNOSIS — Z681 Body mass index (BMI) 19 or less, adult: Secondary | ICD-10-CM | POA: Insufficient documentation

## 2019-04-11 DIAGNOSIS — K319 Disease of stomach and duodenum, unspecified: Secondary | ICD-10-CM | POA: Insufficient documentation

## 2019-04-11 DIAGNOSIS — I10 Essential (primary) hypertension: Secondary | ICD-10-CM | POA: Insufficient documentation

## 2019-04-11 DIAGNOSIS — K2950 Unspecified chronic gastritis without bleeding: Secondary | ICD-10-CM | POA: Diagnosis not present

## 2019-04-11 DIAGNOSIS — R945 Abnormal results of liver function studies: Secondary | ICD-10-CM | POA: Insufficient documentation

## 2019-04-11 DIAGNOSIS — Z885 Allergy status to narcotic agent status: Secondary | ICD-10-CM | POA: Insufficient documentation

## 2019-04-11 DIAGNOSIS — R1310 Dysphagia, unspecified: Secondary | ICD-10-CM | POA: Diagnosis not present

## 2019-04-11 DIAGNOSIS — Z86010 Personal history of colonic polyps: Secondary | ICD-10-CM | POA: Diagnosis not present

## 2019-04-11 DIAGNOSIS — Z7901 Long term (current) use of anticoagulants: Secondary | ICD-10-CM | POA: Insufficient documentation

## 2019-04-11 DIAGNOSIS — E039 Hypothyroidism, unspecified: Secondary | ICD-10-CM | POA: Insufficient documentation

## 2019-04-11 DIAGNOSIS — M810 Age-related osteoporosis without current pathological fracture: Secondary | ICD-10-CM | POA: Insufficient documentation

## 2019-04-11 DIAGNOSIS — R630 Anorexia: Secondary | ICD-10-CM | POA: Insufficient documentation

## 2019-04-11 DIAGNOSIS — Z880 Allergy status to penicillin: Secondary | ICD-10-CM | POA: Insufficient documentation

## 2019-04-11 DIAGNOSIS — R634 Abnormal weight loss: Secondary | ICD-10-CM | POA: Diagnosis not present

## 2019-04-11 DIAGNOSIS — K219 Gastro-esophageal reflux disease without esophagitis: Secondary | ICD-10-CM | POA: Insufficient documentation

## 2019-04-11 DIAGNOSIS — N400 Enlarged prostate without lower urinary tract symptoms: Secondary | ICD-10-CM | POA: Insufficient documentation

## 2019-04-11 DIAGNOSIS — Z888 Allergy status to other drugs, medicaments and biological substances status: Secondary | ICD-10-CM | POA: Insufficient documentation

## 2019-04-11 DIAGNOSIS — Z79899 Other long term (current) drug therapy: Secondary | ICD-10-CM | POA: Insufficient documentation

## 2019-04-11 DIAGNOSIS — F0390 Unspecified dementia without behavioral disturbance: Secondary | ICD-10-CM | POA: Insufficient documentation

## 2019-04-11 DIAGNOSIS — Z8521 Personal history of malignant neoplasm of larynx: Secondary | ICD-10-CM | POA: Insufficient documentation

## 2019-04-11 DIAGNOSIS — I6523 Occlusion and stenosis of bilateral carotid arteries: Secondary | ICD-10-CM | POA: Insufficient documentation

## 2019-04-11 HISTORY — PX: ESOPHAGOGASTRODUODENOSCOPY (EGD) WITH PROPOFOL: SHX5813

## 2019-04-11 HISTORY — PX: MALONEY DILATION: SHX5535

## 2019-04-11 LAB — KOH PREP

## 2019-04-11 LAB — SARS CORONAVIRUS 2 BY RT PCR (HOSPITAL ORDER, PERFORMED IN ~~LOC~~ HOSPITAL LAB): SARS Coronavirus 2: NEGATIVE

## 2019-04-11 SURGERY — ESOPHAGOGASTRODUODENOSCOPY (EGD) WITH PROPOFOL
Anesthesia: General

## 2019-04-11 MED ORDER — FLUCONAZOLE 100 MG PO TABS: ORAL_TABLET | ORAL | 0 refills | Status: DC

## 2019-04-11 MED ORDER — CHLORHEXIDINE GLUCONATE CLOTH 2 % EX PADS: 6.0000 | MEDICATED_PAD | Freq: Once | CUTANEOUS | Status: DC

## 2019-04-11 MED ORDER — PROPOFOL 10 MG/ML IV BOLUS
INTRAVENOUS | Status: DC | PRN
  Administered 2019-04-11: 15 mg via INTRAVENOUS

## 2019-04-11 MED ORDER — MIDAZOLAM HCL 2 MG/2ML IJ SOLN: 0.5000 mg | Freq: Once | INTRAMUSCULAR | Status: DC | PRN

## 2019-04-11 MED ORDER — PROPOFOL 500 MG/50ML IV EMUL
INTRAVENOUS | Status: DC | PRN
  Administered 2019-04-11: 125 ug/kg/min via INTRAVENOUS

## 2019-04-11 MED ORDER — KETAMINE HCL 50 MG/5ML IJ SOSY
PREFILLED_SYRINGE | INTRAMUSCULAR | Status: AC
  Filled 2019-04-11: qty 5

## 2019-04-11 MED ORDER — PROPOFOL 10 MG/ML IV BOLUS
INTRAVENOUS | Status: AC
  Filled 2019-04-11: qty 20

## 2019-04-11 MED ORDER — LACTATED RINGERS IV SOLN
INTRAVENOUS | Status: DC
  Administered 2019-04-11: 12:00:00 via INTRAVENOUS

## 2019-04-11 MED ORDER — SODIUM CHLORIDE 0.9% FLUSH
INTRAVENOUS | Status: AC
  Filled 2019-04-11: qty 30

## 2019-04-11 NOTE — Anesthesia Postprocedure Evaluation (Signed)
Anesthesia Post Note  Patient: [name]  Procedure(s) Performed: ESOPHAGOGASTRODUODENOSCOPY (EGD) WITH PROPOFOL (N/A ) MALONEY DILATION (N/A )  Patient location during evaluation: PACU Anesthesia Type: General Level of consciousness: awake and alert and oriented Pain management: pain level controlled Vital Signs Assessment: post-procedure vital signs reviewed and stable Respiratory status: spontaneous breathing Cardiovascular status: blood pressure returned to baseline Postop Assessment: no apparent nausea or vomiting Anesthetic complications: no     Last Vitals:  Vitals:   04/11/19 1121  BP: 139/75  Pulse: 73  Resp: 16  Temp: 36.6 C  SpO2: 97%    Last Pain:  Vitals:   04/11/19 1218  TempSrc:   PainSc: 0-No pain                 Acey Woodfield

## 2019-04-11 NOTE — Transfer of Care (Signed)
Immediate Anesthesia Transfer of Care Note  Patient: Phillip Taylor  Procedure(s) Performed: ESOPHAGOGASTRODUODENOSCOPY (EGD) WITH PROPOFOL (N/A ) MALONEY DILATION (N/A )  Patient Location: PACU  Anesthesia Type:General  Level of Consciousness: awake  Airway & Oxygen Therapy: Patient Spontanous Breathing  Post-op Assessment: Report given to RN  Post vital signs: Reviewed  Last Vitals:  Vitals Value Taken Time  BP 107/54 04/11/19 1243  Temp    Pulse 73 04/11/19 1245  Resp 18 04/11/19 1245  SpO2 98 % 04/11/19 1245  Vitals shown include unvalidated device data.  Last Pain:  Vitals:   04/11/19 1218  TempSrc:   PainSc: 0-No pain         Complications: No apparent anesthesia complications

## 2019-04-11 NOTE — Discharge Instructions (Signed)
EGD Discharge instructions Please read the instructions outlined below and refer to this sheet in the next few weeks. These discharge instructions provide you with general information on caring for yourself after you leave the hospital. Your doctor may also give you specific instructions. While your treatment has been planned according to the most current medical practices available, unavoidable complications occasionally occur. If you have any problems or questions after discharge, please call your doctor. ACTIVITY  You may resume your regular activity but move at a slower pace for the next 24 hours.   Take frequent rest periods for the next 24 hours.   Walking will help expel (get rid of) the air and reduce the bloated feeling in your abdomen.   No driving for 24 hours (because of the anesthesia (medicine) used during the test).   You may shower.   Do not sign any important legal documents or operate any machinery for 24 hours (because of the anesthesia used during the test).  NUTRITION  Drink plenty of fluids.   You may resume your normal diet.   Begin with a light meal and progress to your normal diet.   Avoid alcoholic beverages for 24 hours or as instructed by your caregiver.  MEDICATIONS  You may resume your normal medications unless your caregiver tells you otherwise.  WHAT YOU CAN EXPECT TODAY  You may experience abdominal discomfort such as a feeling of fullness or gas pains.  FOLLOW-UP  Your doctor will discuss the results of your test with you.  SEEK IMMEDIATE MEDICAL ATTENTION IF ANY OF THE FOLLOWING OCCUR:  Excessive nausea (feeling sick to your stomach) and/or vomiting.   Severe abdominal pain and distention (swelling).   Trouble swallowing.   Temperature over 101 F (37.8 C).   Rectal bleeding or vomiting of blood.    Begin Protonix 40 mg mg orally twice daily.  Advance diet as tolerated  Further recommendations to follow pending review of lab  tests  Avoid all forms of aspirin/NSAID medication.  I called patient's son, Phillip Taylor, at [phone] and left a message on voicemail      Monitored Anesthesia Care, Care After These instructions provide you with information about caring for yourself after your procedure. Your health care provider may also give you more specific instructions. Your treatment has been planned according to current medical practices, but problems sometimes occur. Call your health care provider if you have any problems or questions after your procedure. What can I expect after the procedure? After your procedure, you may:  Feel sleepy for several hours.  Feel clumsy and have poor balance for several hours.  Feel forgetful about what happened after the procedure.  Have poor judgment for several hours.  Feel nauseous or vomit.  Have a sore throat if you had a breathing tube during the procedure. Follow these instructions at home: For at least 24 hours after the procedure:      Have a responsible adult stay with you. It is important to have someone help care for you until you are awake and alert.  Rest as needed.  Do not: ? Participate in activities in which you could fall or become injured. ? Drive. ? Use heavy machinery. ? Drink alcohol. ? Take sleeping pills or medicines that cause drowsiness. ? Make important decisions or sign legal documents. ? Take care of children on your own. Eating and drinking  Follow the diet that is recommended by your health care provider.  If you vomit, drink water,  juice, or soup when you can drink without vomiting.  Make sure you have little or no nausea before eating solid foods. General instructions  Take over-the-counter and prescription medicines only as told by your health care provider.  If you have sleep apnea, surgery and certain medicines can increase your risk for breathing problems. Follow instructions from your health care provider about  wearing your sleep device: ? Anytime you are sleeping, including during daytime naps. ? While taking prescription pain medicines, sleeping medicines, or medicines that make you drowsy.  If you smoke, do not smoke without supervision.  Keep all follow-up visits as told by your health care provider. This is important. Contact a health care provider if:  You keep feeling nauseous or you keep vomiting.  You feel light-headed.  You develop a rash.  You have a fever. Get help right away if:  You have trouble breathing. Summary  For several hours after your procedure, you may feel sleepy and have poor judgment.  Have a responsible adult stay with you for at least 24 hours or until you are awake and alert. This information is not intended to replace advice given to you by your health care provider. Make sure you discuss any questions you have with your health care provider. Document Released: 01/10/2016 Document Revised: 12/18/2017 Document Reviewed: 01/10/2016 Elsevier Patient Education  2020 [employer].

## 2019-04-11 NOTE — Anesthesia Preprocedure Evaluation (Addendum)
Anesthesia Evaluation    History of Anesthesia Complications (+) PONV  Airway Mallampati: II  TM Distance: >3 FB Neck ROM: Full    Dental no notable dental hx. (+) Poor Dentition   Pulmonary    Pulmonary exam normal breath sounds clear to auscultation       Cardiovascular Exercise Tolerance: Poor hypertension, Pt. on medications and Pt. on home beta blockers Normal cardiovascular examII Rhythm:Regular Rate:Normal  Reported carotid stenosis by chart which is asymptomatic per chart   Neuro/Psych PSYCHIATRIC DISORDERS Dementia    GI/Hepatic GERD  Medicated and Controlled,  Endo/Other  Hypothyroidism   Renal/GU Renal InsufficiencyRenal disease     Musculoskeletal   Abdominal   Peds  Hematology  (+) anemia ,   Anesthesia Other Findings   Reproductive/Obstetrics                            Anesthesia Physical Anesthesia Plan  ASA: IV  Anesthesia Plan: General   Post-op Pain Management:    Induction: Intravenous  PONV Risk Score and Plan: 3 and Propofol infusion, TIVA and Treatment may vary due to age or medical condition  Airway Management Planned: Nasal Cannula and Simple Face Mask  Additional Equipment:   Intra-op Plan:   Post-operative Plan:   Informed Consent: I have reviewed the patients History and Physical, chart, labs and discussed the procedure including the risks, benefits and alternatives for the proposed anesthesia with the patient or authorized representative who has indicated his/her understanding and acceptance.     Dental advisory given  Plan Discussed with: CRNA  Anesthesia Plan Comments: (Plan Full PPE use  Plan GA with GETA back up  NH reports NPO, pt oriented ,but unsure about breakfast this am  If stomach full will abort procedure- Dr. Gala Romney aware)        Anesthesia Quick Evaluation

## 2019-04-11 NOTE — Op Note (Signed)
[facility] Patient Name: Phillip Taylor Procedure Date: 04/11/2019 11:16 AM MRN: [id number] Date of Birth: 02-27-1925 Attending MD: Norvel Richards , MD CSN: [id number] Age: 85 Admit Type: Outpatient Procedure:                Upper GI endoscopy Indications:              Dysphagia, Anorexia, Weight loss Providers:                Norvel Richards, MD, Otis Peak B. Sharon Seller, RN,                            Raphael Gibney, Technician Referring MD:              Medicines:                Propofol per Anesthesia Complications:            No immediate complications. Estimated Blood Loss:     Estimated blood loss was minimal. Procedure:                Pre-Anesthesia Assessment:                           - Prior to the procedure, a History and Physical                            was performed, and patient medications and                            allergies were reviewed. The patient's tolerance of                            previous anesthesia was also reviewed. The risks                            and benefits of the procedure and the sedation                            options and risks were discussed with the patient.                            All questions were answered, and informed consent                            was obtained. Prior Anticoagulants: The patient has                            taken no previous anticoagulant or antiplatelet                            agents. ASA Grade Assessment: III - A patient with                            severe systemic disease. After reviewing the risks  and benefits, the patient was deemed in                            satisfactory condition to undergo the procedure.                           After obtaining informed consent, the endoscope was                            passed under direct vision. Throughout the                            procedure, the patient's blood pressure, pulse, and    oxygen saturations were monitored continuously. The                            [id number]-[id number] ([id number]) was introduced through the                            mouth, and advanced to the second part of duodenum.                            The upper GI endoscopy was accomplished without                            difficulty. The patient tolerated the procedure                            well. Scope In: 12:23:27 PM Scope Out: 12:35:34 PM Total Procedure Duration: 0 hours 12 minutes 7 seconds  Findings:      Plaques in the posterior hypopharynx suspicious for thrush. Whitish       raised plaques in the proximal esophagus( please see photos). A moderate       Schatzki ring was found at the gastroesophageal junction.      Multiple erosions were found in the entire examined stomach.      Bulbar erosions present. At the the junction of the first and second       portion of the duodenum, there was a large cratered 1.5 cm ulcer with       overlying eschar. Otherwise, the remainder of the second portion of       duodenum appeared normal. Please see photos. The scope was withdrawn.       Dilation was performed with a Maloney dilator with mild resistance at 25       Fr. Dilation was performed with a Maloney dilator with mild resistance       at 56 Fr. The dilation site was examined following endoscope reinsertion       and showed moderate improvement in luminal narrowing. Estimated blood       loss was minimal. Subsequently, biopsies of the abnormal gastric mucosa       taken for histologic study. Finally, the plaques in the proximal       esophagus brushed for KOH prep. Impression:               - Moderate Schatzki ring. Dilated. Esophageal  plaques - status post KOH prep                           - Erosive gastropathy. Status post gastric biopsy-                           -Large duodenal ulcer with associated erosions as                            described above                            -Patient found to have multiple abnormalities today                            which could contribute to his dysphagia,                            anorexia/weight loss. Medical record indicates                            allergies to Protonix and omeprazole. They were                            entered into the record in 2014 without any other                            information being known. Patient does not recall.                            It is notable, patient was on omeprazole in 2 years                            ago without any apparent apparent difficulties. Has                            not taken Protonix. Moderate Sedation:      Moderate (conscious) sedation was personally administered by an       anesthesia professional. The following parameters were monitored: oxygen       saturation, heart rate, blood pressure, respiratory rate, EKG, adequacy       of pulmonary ventilation, and response to care. Recommendation:           - Patient has a contact number available for                            emergencies. The signs and symptoms of potential                            delayed complications were discussed with the                            patient. Return to normal activities tomorrow.  Written discharge instructions were provided to the                            patient.                           - Advance diet as tolerated. Begin Protonix 40 mg                            orally twice daily. Follow-up on KOH brushing,                            biopsy. Further recommendations to follow. Avoid                            all nonsteroidal agents. Procedure Code(s):        --- Professional ---                           [id number], Esophagogastroduodenoscopy, flexible,                            transoral; diagnostic, including collection of                            specimen(s) by brushing or washing, when performed                             (separate procedure)                           43450, Dilation of esophagus, by unguided sound or                            bougie, single or multiple passes Diagnosis Code(s):        --- Professional ---                           K22.2, Esophageal obstruction                           K31.89, Other diseases of stomach and duodenum                           R13.10, Dysphagia, unspecified                           R63.0, Anorexia                           R63.4, Abnormal weight loss CPT copyright 2019 [employer]. All rights reserved. The codes documented in this report are preliminary and upon coder review may  be revised to meet current compliance requirements. Cristopher Estimable. Rolan Wrightsman, MD Norvel Richards, MD 04/11/2019 1:03:32 PM This report has been signed electronically. Number of Addenda: 0

## 2019-04-11 NOTE — Interval H&P Note (Signed)
History and Physical Interval Note:  04/11/2019 12:10 PM  Phillip Taylor  has presented today for surgery, with the diagnosis of dysphagia, weight loss.  The various methods of treatment have been discussed with the patient and family. After consideration of risks, benefits and other options for treatment, the patient has consented to  Procedure(s) with comments: ESOPHAGOGASTRODUODENOSCOPY (EGD) WITH PROPOFOL (N/A) - 2:00pm MALONEY DILATION (N/A) as a surgical intervention.  The patient's history has been reviewed, patient examined, no change in status, stable for surgery.  I have reviewed the patient's chart and labs.  Questions were answered to the patient's satisfaction.     Phillip Taylor  No change.  Patient no longer on Lovenox.  EGD with ED per plan.  The risks, benefits, limitations, alternatives and imponderables have been reviewed with the patient. Potential for esophageal dilation, biopsy, etc. have also been reviewed.  Questions have been answered. All parties agreeable.  Discussed at length with son day before yesterday.

## 2019-04-15 ENCOUNTER — Non-Acute Institutional Stay (SKILLED_NURSING_FACILITY): Payer: Medicare Other | Admitting: Adult Health

## 2019-04-15 ENCOUNTER — Encounter: Payer: Self-pay | Admitting: Adult Health

## 2019-04-15 DIAGNOSIS — K219 Gastro-esophageal reflux disease without esophagitis: Secondary | ICD-10-CM | POA: Diagnosis not present

## 2019-04-15 DIAGNOSIS — S72144S Nondisplaced intertrochanteric fracture of right femur, sequela: Secondary | ICD-10-CM

## 2019-04-15 DIAGNOSIS — I10 Essential (primary) hypertension: Secondary | ICD-10-CM

## 2019-04-15 NOTE — Progress Notes (Signed)
Location:    [facility] Room Number: 129 p Place of Service:  SNF (31)   CODE STATUS: DNR  Allergies  Allergen Reactions   Prevacid [Lansoprazole] Other (See Comments)    Does not remeber   Prilosec [Omeprazole] Other (See Comments)    Does not remeber   Codeine Other (See Comments)    Does not know    Penicillins Rash    Chief Complaint  Patient presents with   Medical Management of Chronic Issues       GERD without esophagitis:   Closed nondisplaced intertrochanteric fracture of right femur sequela:Marland Kitchen Essential hypertension:    HPI:  He is a 85 year old long term resident of this facility being seen for the management of his chronic illnesses: gerd; right femur fracture; hypertension. He denies any heart burn; no difficulty swallowing today; no uncontrolled pain. He has been seen by GI; is now on protonix 40 mg twice daily. He does continue to slowly lose weight.   Past Medical History:  Diagnosis Date   BPH (benign prostatic hyperplasia)    Carotid sinus hypersensitivity    Carotid stenosis, asymptomatic, bilateral    Dementia (HCC)    GERD (gastroesophageal reflux disease)    Hypertension    Hypothyroidism    Osteoporosis    PONV (postoperative nausea and vomiting)    Vocal cord cancer ([place]) 1995   cancer of the larynx s/p resection and XRT    Past Surgical History:  Procedure Laterality Date   COLONOSCOPY  10/25/2012   Dr. Gala Romney: diverticulosis, multiple colon tubular adenomas removed. next TCS in 3 years.    HERNIA REPAIR     INTRAMEDULLARY (IM) NAIL INTERTROCHANTERIC Right 03/09/2019   Procedure: INTRAMEDULLARY (IM) NAIL INTERTROCHANTRIC;  Surgeon: Carole Civil, MD;  Location: [facility];  Service: Orthopedics;  Laterality: Right;   NISSEN FUNDOPLICATION     PACEMAKER INSERTION     TRANSURETHRAL RESECTION OF PROSTATE     vocal cord cancer removal  1995    Social History   Socioeconomic History    Marital status: Married    Spouse name: Not on file   Number of children: Not on file   Years of education: Not on file   Highest education level: Not on file  Occupational History   Occupation: Retired    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[place] resource strain: Not on file   Food insecurity    Worry: Not on file    Inability: Not on Lexicographer needs    Medical: Not on file    Non-medical: Not on file  Tobacco Use   Smoking status: Never Smoker   Smokeless tobacco: Never Used  Substance and Sexual Activity   Alcohol use: No    Frequency: Never   Drug use: No   Sexual activity: Not on file  Lifestyle   Physical activity    Days per week: Not on file    Minutes per session: Not on file   Stress: Not on file  Relationships   Social connections    Talks on phone: Not on file    Gets together: Not on file    Attends religious service: Not on file    Active member of club or organization: Not on file    Attends meetings of clubs or organizations: Not on file    Relationship status: Not on file   Intimate partner violence  Fear of current or ex partner: Not on file    Emotionally abused: Not on file    Physically abused: Not on file    Forced sexual activity: Not on file  Other Topics Concern   Not on file  Social History Narrative   ** Merged History Encounter **       Family History  Problem Relation Age of Onset   Colon cancer Neg Hx       VITAL SIGNS BP 115/68    Pulse 69    Temp 98.9 F (37.2 C)    Resp 20    Ht 6' (1.829 m)    Wt 129 lb 12.8 oz (58.9 kg)    BMI 17.60 kg/m   Outpatient Encounter Medications as of 04/15/2019  Medication Sig   Amino Acids-Protein Hydrolys (FEEDING SUPPLEMENT, PRO-STAT SUGAR FREE 64,) LIQD Take 30 mLs by mouth 2 (two) times daily between meals.   calcium carbonate (TUMS EX) 750 MG chewable tablet Chew 1 tablet by mouth 2 (two) times daily.   carvedilol (COREG) 3.125 MG  tablet Take 3.125 mg by mouth 2 (two) times daily with a meal.   docusate sodium (COLACE) 100 MG capsule Take 1 capsule (100 mg total) by mouth 2 (two) times daily.   levothyroxine (SYNTHROID) 75 MCG tablet Take 75 mcg by mouth daily before breakfast.   NON FORMULARY Diet Type: REGULAR   Nutritional Supplements (ENSURE ENLIVE PO) Take 1 Bottle by mouth daily.   pantoprazole (PROTONIX) 40 MG tablet Take 40 mg by mouth 2 (two) times daily.   potassium chloride SA (K-DUR) 20 MEQ tablet Take 20 mEq by mouth daily.   vitamin B-12 (CYANOCOBALAMIN) 1000 MCG tablet Take 1,000 mcg by mouth daily.   No facility-administered encounter medications on file as of 04/15/2019.      SIGNIFICANT DIAGNOSTIC EXAMS  PREVIOUS;   03-09-19: right hip x-ray: Comminuted intertrochanteric fracture of the right femoral neck.  03-09-19: chest x-ray: no active disease   03-27-19: right upper quad ultrasound:  1. Increased echogenicity liver parenchyma and perivascular regions. The appearance is nonspecific but typically seen in inflammatory conditions such as hepatitis. 2. Slight prominence of the common bile duct without intrahepatic ductal dilatation or discrete stone. 3. Sludge in the gallbladder.  TODAY:  04-11-19: upper endoscopy: moderate Schatzki ring dilated esophageal plaques status post KOH prep. Erosive gastropathy; status post gastric biopsy; large duodenal ulcer associated with erosions.       LABS REVIEWED PREVIOUS;   03-08-19: wbc 12.6; hgb 14.6; hct 44.8; mcv 100.7 ;plt 173; glucose 188; bun 28; creat 1.46; k+ 3.5; na++ 139; ca 8.7  03-09-19; wbc 10.5; hgb 13.2; hct 38.9; mcv 99.7; plt 157 glucose 168; bun 27; creat 1.21; k+ 3.7; na++ 141; ca 8.6 liver normal albumin 3.7 hgb a1c 5.3  03-10-19: glucose 130; bun 22; creat 1.14; k+ 3.5; an++ 137; ca 7.4  03-11-19: wbc 7.9; hgb 10.0; hct 30.4 mcv 1001.7; plt 133 03-15-19: wbc 6.2; hgb 10.4; hct 30.7; mcv 99.7; plt 207; glucose 95; bun 23; creat 0.86; k+  3.3; na++ 137; ca 7.8; vit B 12: 300; BNP 195.0 tsh 4.413 03-21-19 k+ 3.9 03-26-19; wbc 9.9; hgb 13.8; hct 43.0; mcv 104.9; plt 286; glucose 163; bun 31; creat 1.16 ;k+ 4.2; na++ 137; ca 8.4 alk phos 192; total bili 2.0 albumin 3.3   03-27-19: glucose 113; bun 23; creat 0.84; k+ 3.9; na++ 140; ca 8.0; alk phos 179; total bili 1.6; direct bili 0.4; indirect bili  1.2 GGT 24 albumin 2.7 hepatitis panel: negative  04-02-19 wbc 7.1; hgb 13.3; hct 39.5; mcv 103.1; plt 200; glucose 108; bun 32; creat 0.94; k+ 3.6; an++ 139; ca 8.0  NO NEW LABS.     Review of Systems  Constitutional: Negative for malaise/fatigue.       Poor appetite   Respiratory: Negative for cough and shortness of breath.   Cardiovascular: Negative for chest pain, palpitations and leg swelling.  Gastrointestinal: Negative for abdominal pain, constipation and heartburn.  Musculoskeletal: Negative for back pain, joint pain and myalgias.  Skin: Negative.   Neurological: Negative for dizziness.  Psychiatric/Behavioral: The patient is not nervous/anxious.      Physical Exam Constitutional:      General: He is not in acute distress.    Appearance: He is underweight. He is not diaphoretic.  HENT:     Head:     Comments: History of vocal cord cancer removal  Neck:     Musculoskeletal: Neck supple.     Thyroid: No thyromegaly.  Cardiovascular:     Rate and Rhythm: Normal rate and regular rhythm.     Pulses: Normal pulses.     Heart sounds: Normal heart sounds.     Comments: Pace maker  Pulmonary:     Effort: Pulmonary effort is normal. No respiratory distress.     Breath sounds: Normal breath sounds.  Abdominal:     General: Bowel sounds are normal. There is no distension.     Palpations: Abdomen is soft.     Tenderness: There is no abdominal tenderness.  Genitourinary:    Comments: History of turp Musculoskeletal:     Right lower leg: No edema.     Left lower leg: No edema.     Comments: Is able to move all  extremities Is status post right hip fracture   Lymphadenopathy:     Cervical: No cervical adenopathy.  Skin:    General: Skin is warm and dry.     Comments: Bilateral lower extremities discolored   Neurological:     General: No focal deficit present.     Mental Status: He is alert.  Psychiatric:        Mood and Affect: Mood normal.        ASSESSMENT/ PLAN:  TODAY:   1. GERD without esophagitis: is stable has been seen by GI: will continue protonix 40 mg twice daily  2.  Closed nondisplaced intertrochanteric fracture of right femur sequela: is stable will continue asa 81 mg daily is off narcotic pain relief.   3. Essential hypertension: is stable b/p 115/68 will continue coreg 3.125 mg twice daily   PREVIOUS   4 Carcinoma of larynx: status post resection and radiation therapy 1995. Will monitor    5. Hypothyroidism: unspecified type: is stable tsh 4.413 will continue synthroid 75 mcg daily   6. Chronic constipation: is stable will continue colace twice daily   7. Carotid artery stenosis bilateral: (R 60-90%; L 40-59% 2018): is stable will monitor  8. Carotid sinus hypersensitivity is stable is stable post pace maker will monitor   9. Elevated liver enzymes: is stable his workup is negative for acute liver disease: will continue to monitor his status; hepatic labs due   10. Hypocalcemia:is stable ca 8.0 will continue tums 750 mg twice daily   11. Acute blood loss anemia: is stable hgb 13.3 will monitor   12. Dementia without behavioral disturbance: is losing weight: weight is 129 (previous 133,  135, 147,  151) pounds; his appetite remains poor will continue to monitor his status.      MD is aware of resident's narcotic use and is in agreement with current plan of care. We will attempt to wean resident as apropriate   [name] NP [facility]  Contact [phone] Monday through Friday 8am- 5pm  After hours call [phone]

## 2019-04-16 ENCOUNTER — Other Ambulatory Visit: Payer: Self-pay | Admitting: *Deleted

## 2019-04-16 ENCOUNTER — Encounter (HOSPITAL_COMMUNITY): Payer: Self-pay | Admitting: Internal Medicine

## 2019-04-16 NOTE — Patient Outreach (Signed)
Member assessed for potential [facility] Care Management needs as a benefit of  [place] [employer].  Member is currently at [facility] receiving rehab therapy.   Member discussed in weekly telephonic IDT meeting with facility staff, [facility] UM team, and writer.  Facility reports that [facility] is screening member for potential transfer to their facility upon SNF discharge. Virtual assessment is scheduled for Friday with [facility].   Facility reports that member will return home if he does not transition to ALF upon SNF discharge.  Writer will continue to follow for disposition plans and progression.  Will continue to collaborate with [facility] UM team and SNF on member.   Marthenia Rolling, MSN-Ed, RN,BSN [facility] Acute Care Coordinator [phone] [facility]) [phone]  (Toll free office)

## 2019-04-17 ENCOUNTER — Encounter (HOSPITAL_COMMUNITY)
Admission: RE | Admit: 2019-04-17 | Discharge: 2019-04-17 | Disposition: A | Discharge status: Home / Self Care | Payer: Medicare Other | Source: Skilled Nursing Facility | Attending: Adult Health | Admitting: Adult Health

## 2019-04-17 DIAGNOSIS — K219 Gastro-esophageal reflux disease without esophagitis: Secondary | ICD-10-CM | POA: Insufficient documentation

## 2019-04-17 LAB — HEPATIC FUNCTION PANEL
ALT: 25 U/L (ref 0–44)
AST: 36 U/L (ref 15–41)
Albumin: 3 g/dL — ABNORMAL LOW (ref 3.5–5.0)
Alkaline Phosphatase: 168 U/L — ABNORMAL HIGH (ref 38–126)
Bilirubin, Direct: 0.3 mg/dL — ABNORMAL HIGH (ref 0.0–0.2)
Indirect Bilirubin: 0.6 mg/dL (ref 0.3–0.9)
Total Bilirubin: 0.9 mg/dL (ref 0.3–1.2)
Total Protein: 6.2 g/dL — ABNORMAL LOW (ref 6.5–8.1)

## 2019-04-18 ENCOUNTER — Encounter: Payer: Self-pay | Admitting: Adult Health

## 2019-04-18 ENCOUNTER — Non-Acute Institutional Stay (SKILLED_NURSING_FACILITY): Payer: Medicare Other | Admitting: Adult Health

## 2019-04-18 DIAGNOSIS — F0390 Unspecified dementia without behavioral disturbance: Secondary | ICD-10-CM

## 2019-04-18 DIAGNOSIS — E43 Unspecified severe protein-calorie malnutrition: Secondary | ICD-10-CM | POA: Diagnosis not present

## 2019-04-18 DIAGNOSIS — S72144S Nondisplaced intertrochanteric fracture of right femur, sequela: Secondary | ICD-10-CM

## 2019-04-18 DIAGNOSIS — R634 Abnormal weight loss: Secondary | ICD-10-CM

## 2019-04-18 NOTE — Progress Notes (Signed)
Location:   [facility] Room Number: 129 P Place of Service:  SNF (31)   CODE STATUS: DNR  Allergies  Allergen Reactions  . Prevacid [Lansoprazole] Other (See Comments)    Does not remeber  . Prilosec [Omeprazole] Other (See Comments)    Does not remeber  . Codeine Other (See Comments)    Does not know   . Penicillins Rash    Chief Complaint  Patient presents with  . Acute Visit    Weight Loss    HPI:  He is losing weight. On 03-27-19: 133 pounds today 126 pounds. He states that he has no appetite he is status post esophageal dilation. He denies any nausea of vomiting. He denies any uncontrolled pain. We have discussed his advanced directives: have filled out MOST form: we discussed code status; he is DNR: he want ivf on a trial basis; he want abt; but does not want a feeding tube. He has verbalized understanding.   Past Medical History:  Diagnosis Date  . BPH (benign prostatic hyperplasia)   . Carotid sinus hypersensitivity   . Carotid stenosis, asymptomatic, bilateral   . Dementia ([place])   . GERD (gastroesophageal reflux disease)   . Hypertension   . Hypothyroidism   . Osteoporosis   . PONV (postoperative nausea and vomiting)   . Vocal cord cancer ([place]) 1995   cancer of the larynx s/p resection and XRT    Past Surgical History:  Procedure Laterality Date  . COLONOSCOPY  10/25/2012   Dr. Gala Romney: diverticulosis, multiple colon tubular adenomas removed. next TCS in 3 years.   . ESOPHAGOGASTRODUODENOSCOPY (EGD) WITH PROPOFOL N/A 04/11/2019   Procedure: ESOPHAGOGASTRODUODENOSCOPY (EGD) WITH PROPOFOL;  Surgeon: Daneil Dolin, MD;  Location: [facility];  Service: Endoscopy;  Laterality: N/A;  2:00pm  . HERNIA REPAIR    . INTRAMEDULLARY (IM) NAIL INTERTROCHANTERIC Right 03/09/2019   Procedure: INTRAMEDULLARY (IM) NAIL INTERTROCHANTRIC;  Surgeon: Carole Civil, MD;  Location: [facility];  Service: Orthopedics;  Laterality: Right;  . MALONEY  DILATION N/A 04/11/2019   Procedure: Venia Minks DILATION;  Surgeon: Daneil Dolin, MD;  Location: [facility];  Service: Endoscopy;  Laterality: N/A;  . NISSEN FUNDOPLICATION    . PACEMAKER INSERTION    . TRANSURETHRAL RESECTION OF PROSTATE    . vocal cord cancer removal  1995    Social History   Socioeconomic History  . Marital status: Married    Spouse name: Not on file  . Number of children: Not on file  . Years of education: Not on file  . Highest education level: Not on file  Occupational History  . 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 Food insecurity    Worry: Not on file    Inability: Not on file  . Transportation needs    Medical: Not on file    Non-medical: Not on file  Tobacco Use  . Smoking status: Never Smoker  . Smokeless tobacco: Never Used  Substance and Sexual Activity  . Alcohol use: No    Frequency: Never  . Drug use: No  . Sexual activity: Not on file  Lifestyle  . Physical activity    Days per week: Not on file    Minutes per session: Not on file  . Stress: Not on file  Relationships  . Social connections    Talks on phone: Not on file  Gets together: Not on file    Attends religious service: Not on file    Active member of club or organization: Not on file    Attends meetings of clubs or organizations: Not on file    Relationship status: Not on file  . Intimate partner violence    Fear of current or ex partner: Not on file    Emotionally abused: Not on file    Physically abused: Not on file    Forced sexual activity: Not on file  Other Topics Concern  . Not on file  Social History Narrative   ** Merged History Encounter **       Family History  Problem Relation Age of Onset  . Colon cancer Neg Hx       VITAL SIGNS BP 131/69   Pulse 68   Temp 97.7 F (36.5 C)   Resp 20   Ht 6' (1.829 m)   Wt 126 lb 6.4 oz (57.3 kg)   BMI 17.14 kg/m    Outpatient Encounter Medications as of 04/18/2019  Medication Sig  . Amino Acids-Protein Hydrolys (FEEDING SUPPLEMENT, PRO-STAT SUGAR FREE 64,) LIQD Take 30 mLs by mouth 2 (two) times daily between meals.  . calcium carbonate (TUMS EX) 750 MG chewable tablet Chew 1 tablet by mouth 2 (two) times daily.  . carvedilol (COREG) 3.125 MG tablet Take 3.125 mg by mouth 2 (two) times daily with a meal.  . docusate sodium (COLACE) 100 MG capsule Take 1 capsule (100 mg total) by mouth 2 (two) times daily.  Marland Kitchen levothyroxine (SYNTHROID) 75 MCG tablet Take 75 mcg by mouth daily before breakfast.  . NON FORMULARY Diet Type: REGULAR  . Nutritional Supplements (ENSURE ENLIVE PO) Take 1 Bottle by mouth daily.  . pantoprazole (PROTONIX) 40 MG tablet Take 40 mg by mouth 2 (two) times daily.  . potassium chloride SA (K-DUR) 20 MEQ tablet Take 20 mEq by mouth daily.  . vitamin B-12 (CYANOCOBALAMIN) 1000 MCG tablet Take 1,000 mcg by mouth daily.   No facility-administered encounter medications on file as of 04/18/2019.      SIGNIFICANT DIAGNOSTIC EXAMS  PREVIOUS;   03-09-19: right hip x-ray: Comminuted intertrochanteric fracture of the right femoral neck.  03-09-19: chest x-ray: no active disease   03-27-19: right upper quad ultrasound:  1. Increased echogenicity liver parenchyma and perivascular regions. The appearance is nonspecific but typically seen in inflammatory conditions such as hepatitis. 2. Slight prominence of the common bile duct without intrahepatic ductal dilatation or discrete stone. 3. Sludge in the gallbladder.  04-11-19: upper endoscopy: moderate Schatzki ring dilated esophageal plaques status post KOH prep. Erosive gastropathy; status post gastric biopsy; large duodenal ulcer associated with erosions.     NO NEW LABS.     LABS REVIEWED PREVIOUS;   03-08-19: wbc 12.6; hgb 14.6; hct 44.8; mcv 100.7 ;plt 173; glucose 188; bun 28; creat 1.46; k+ 3.5; na++ 139; ca 8.7  03-09-19; wbc 10.5; hgb  13.2; hct 38.9; mcv 99.7; plt 157 glucose 168; bun 27; creat 1.21; k+ 3.7; na++ 141; ca 8.6 liver normal albumin 3.7 hgb a1c 5.3  03-10-19: glucose 130; bun 22; creat 1.14; k+ 3.5; an++ 137; ca 7.4  03-11-19: wbc 7.9; hgb 10.0; hct 30.4 mcv 1001.7; plt 133 03-15-19: wbc 6.2; hgb 10.4; hct 30.7; mcv 99.7; plt 207; glucose 95; bun 23; creat 0.86; k+ 3.3; na++ 137; ca 7.8; vit B 12: 300; BNP 195.0 tsh 4.413 03-21-19 k+ 3.9 03-26-19; wbc 9.9; hgb 13.8; hct  43.0; mcv 104.9; plt 286; glucose 163; bun 31; creat 1.16 ;k+ 4.2; na++ 137; ca 8.4 alk phos 192; total bili 2.0 albumin 3.3   03-27-19: glucose 113; bun 23; creat 0.84; k+ 3.9; na++ 140; ca 8.0; alk phos 179; total bili 1.6; direct bili 0.4; indirect bili 1.2 GGT 24 albumin 2.7 hepatitis panel: negative  04-02-19 wbc 7.1; hgb 13.3; hct 39.5; mcv 103.1; plt 200; glucose 108; bun 32; creat 0.94; k+ 3.6; na++ 139; ca 8.0   TODAY:   04-17-19: alk phos 168; direct bili 0.3; albumin 3.0    Review of Systems  Constitutional: Positive for malaise/fatigue and weight loss.       No appetite   Respiratory: Negative for cough and shortness of breath.   Cardiovascular: Negative for chest pain, palpitations and leg swelling.  Gastrointestinal: Negative for abdominal pain, constipation and heartburn.  Musculoskeletal: Negative for back pain, joint pain and myalgias.  Skin: Negative.   Neurological: Negative for dizziness.  Psychiatric/Behavioral: The patient is not nervous/anxious.      Physical Exam Constitutional:      General: He is not in acute distress.    Appearance: He is cachectic. He is not diaphoretic.  HENT:     Mouth/Throat:     Comments: History of vocal cord cancer removal  Neck:     Musculoskeletal: Neck supple.     Thyroid: No thyromegaly.  Cardiovascular:     Rate and Rhythm: Normal rate and regular rhythm.     Pulses: Normal pulses.     Heart sounds: Normal heart sounds.     Comments: Pace maker  Pulmonary:     Effort: Pulmonary  effort is normal. No respiratory distress.     Breath sounds: Normal breath sounds.  Abdominal:     General: Bowel sounds are normal. There is no distension.     Palpations: Abdomen is soft.     Tenderness: There is no abdominal tenderness.     Comments: Status post esophageal dilation   Genitourinary:    Comments: History of turp Musculoskeletal:     Right lower leg: No edema.     Left lower leg: No edema.     Comments: Is able to move all extremities Is status post right hip fracture    Lymphadenopathy:     Cervical: No cervical adenopathy.  Skin:    General: Skin is warm and dry.     Comments: Bilateral lower extremities discolored   Neurological:     Mental Status: He is alert and oriented to person, place, and time.  Psychiatric:        Mood and Affect: Mood normal.       ASSESSMENT/ PLAN:  TODAY:   1. Closed nondisplaced intertrochanteric fracture of right femur sequela  2. Dementia without behavioral disturbance unspecified dementia type 3. Weight loss 4. Protein calorie malnutrition severe  He is worse  Will begin remeron 7.5 mg nightly through 05-02-19.  Will have palliative care see him.  MOST form filled out Will continue to monitor his status.   Time spent with patient 40 minutes (20 minutes spent with advanced directives) MOST form filled out DNR: no tube feeding. [place] for hospitalization; ivf on a trial basis.     MD is aware of resident's narcotic use and is in agreement with current plan of care. We will attempt to wean resident as apropriate   [name] NP [facility]  Contact [phone] Monday through Friday 8am- 5pm  After hours  call [phone]

## 2019-04-23 ENCOUNTER — Encounter: Payer: Self-pay | Admitting: Adult Health

## 2019-04-23 ENCOUNTER — Non-Acute Institutional Stay (SKILLED_NURSING_FACILITY): Payer: Medicare Other | Admitting: Adult Health

## 2019-04-23 ENCOUNTER — Encounter (HOSPITAL_COMMUNITY)
Admission: RE | Admit: 2019-04-23 | Discharge: 2019-04-23 | Disposition: A | Discharge status: Home / Self Care | Payer: Medicare Other | Source: Skilled Nursing Facility | Attending: *Deleted | Admitting: *Deleted

## 2019-04-23 DIAGNOSIS — E43 Unspecified severe protein-calorie malnutrition: Secondary | ICD-10-CM | POA: Diagnosis not present

## 2019-04-23 DIAGNOSIS — S72144S Nondisplaced intertrochanteric fracture of right femur, sequela: Secondary | ICD-10-CM

## 2019-04-23 DIAGNOSIS — F0390 Unspecified dementia without behavioral disturbance: Secondary | ICD-10-CM

## 2019-04-23 LAB — SARS CORONAVIRUS 2 BY RT PCR (HOSPITAL ORDER, PERFORMED IN ~~LOC~~ HOSPITAL LAB): SARS Coronavirus 2: NEGATIVE

## 2019-04-23 NOTE — Progress Notes (Signed)
Location:   [place] Room Number: 129 P Place of Service:  SNF (31)    CODE STATUS: DNR  Allergies  Allergen Reactions  . Prevacid [Lansoprazole] Other (See Comments)    Does not remeber  . Prilosec [Omeprazole] Other (See Comments)    Does not remeber  . Codeine Other (See Comments)    Does not know   . Penicillins Rash    Chief Complaint  Patient presents with  . Discharge Note    Discharging to ALF on 04/25/2019    HPI:  He is being discharged to assisted living with home health for pt/ot. He will need a standard wheelchair and will need a 3:1 commode. He will follow up medically with the receiving facility and they will provide his medications.  He had been hospitalized for a right femur fracture. He was admitted to this facility for short term rehab. He has had difficulty with his appetite. He was seen by GI for an esophageal dilation. He has continued to lose weight.  He is ready for discharge to assisted living.     Past Medical History:  Diagnosis Date  . BPH (benign prostatic hyperplasia)   . Carotid sinus hypersensitivity   . Carotid stenosis, asymptomatic, bilateral   . Dementia ([place])   . GERD (gastroesophageal reflux disease)   . Hypertension   . Hypothyroidism   . Osteoporosis   . PONV (postoperative nausea and vomiting)   . Vocal cord cancer ([place]) 1995   cancer of the larynx s/p resection and XRT    Past Surgical History:  Procedure Laterality Date  . COLONOSCOPY  10/25/2012   Dr. Gala Romney: diverticulosis, multiple colon tubular adenomas removed. next TCS in 3 years.   . ESOPHAGOGASTRODUODENOSCOPY (EGD) WITH PROPOFOL N/A 04/11/2019   Procedure: ESOPHAGOGASTRODUODENOSCOPY (EGD) WITH PROPOFOL;  Surgeon: Daneil Dolin, MD;  [facility];  Service: Endoscopy;  Laterality: N/A;  2:00pm  . HERNIA REPAIR    . INTRAMEDULLARY (IM) NAIL INTERTROCHANTERIC Right 03/09/2019   Procedure: INTRAMEDULLARY (IM) NAIL  INTERTROCHANTRIC;  Surgeon: Carole Civil, MD;  Location: [facility];  Service: Orthopedics;  Laterality: Right;  . MALONEY DILATION N/A 04/11/2019   Procedure: Venia Minks DILATION;  Surgeon: Daneil Dolin, MD;  [facility];  Service: Endoscopy;  Laterality: N/A;  . NISSEN FUNDOPLICATION    . PACEMAKER INSERTION    . TRANSURETHRAL RESECTION OF PROSTATE    . vocal cord cancer removal  1995    Social History   Socioeconomic History  . Marital status: Married    Spouse name: Not on file  . Number of children: Not on file  . Years of education: Not on file  . Highest education level: Not on file  Occupational History  . 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 Food insecurity    Worry: Not on file    Inability: Not on file  . Transportation needs    Medical: Not on file    Non-medical: Not on file  Tobacco Use  . Smoking status: Never Smoker  . Smokeless tobacco: Never Used  Substance and Sexual Activity  . Alcohol use: No    Frequency: Never  . Drug use: No  . Sexual activity: Not on file  Lifestyle  . Physical activity    Days per week: Not on file    Minutes per session: Not on  file  . Stress: Not on file  Relationships  . Social Herbalist on phone: Not on file    Gets together: Not on file    Attends religious service: Not on file    Active member of club or organization: Not on file    Attends meetings of clubs or organizations: Not on file    Relationship status: Not on file  . Intimate partner violence    Fear of current or ex partner: Not on file    Emotionally abused: Not on file    Physically abused: Not on file    Forced sexual activity: Not on file  Other Topics Concern  . Not on file  Social History Narrative   ** Merged History Encounter **       Family History  Problem Relation Age of Onset  . Colon cancer Neg Hx     VITAL SIGNS BP 115/70    Pulse 80   Temp (!) 97.1 F (36.2 C)   Resp 20   Ht 6' (1.829 m)   Wt 124 lb 3.2 oz (56.3 kg)   BMI 16.84 kg/m   Patient's Medications  New Prescriptions   No medications on file  Previous Medications   AMINO ACIDS-PROTEIN HYDROLYS (FEEDING SUPPLEMENT, PRO-STAT SUGAR FREE 64,) LIQD    Take 30 mLs by mouth 2 (two) times daily between meals.   CALCIUM CARBONATE (TUMS EX) 750 MG CHEWABLE TABLET    Chew 1 tablet by mouth 2 (two) times daily.   CARVEDILOL (COREG) 3.125 MG TABLET    Take 3.125 mg by mouth 2 (two) times daily with a meal.   DOCUSATE SODIUM (COLACE) 100 MG CAPSULE    Take 1 capsule (100 mg total) by mouth 2 (two) times daily.   LEVOTHYROXINE (SYNTHROID) 75 MCG TABLET    Take 75 mcg by mouth daily before breakfast.   MIRTAZAPINE (REMERON) 7.5 MG TABLET    Take 7.5 mg by mouth at bedtime.   NON FORMULARY    Diet Type: REGULAR   NUTRITIONAL SUPPLEMENTS (ENSURE ENLIVE PO)    Take 1 Bottle by mouth daily.   PANTOPRAZOLE (PROTONIX) 40 MG TABLET    Take 40 mg by mouth 2 (two) times daily.   POTASSIUM CHLORIDE SA (K-DUR) 20 MEQ TABLET    Take 20 mEq by mouth daily.   VITAMIN B-12 (CYANOCOBALAMIN) 1000 MCG TABLET    Take 1,000 mcg by mouth daily.  Modified Medications   No medications on file  Discontinued Medications   No medications on file     SIGNIFICANT DIAGNOSTIC EXAMS  PREVIOUS;   03-09-19: right hip x-ray: Comminuted intertrochanteric fracture of the right femoral neck.  03-09-19: chest x-ray: no active disease   03-27-19: right upper quad ultrasound:  1. Increased echogenicity liver parenchyma and perivascular regions. The appearance is nonspecific but typically seen in inflammatory conditions such as hepatitis. 2. Slight prominence of the common bile duct without intrahepatic ductal dilatation or discrete stone. 3. Sludge in the gallbladder.  04-11-19: upper endoscopy: moderate Schatzki ring dilated esophageal plaques status post KOH prep. Erosive gastropathy; status  post gastric biopsy; large duodenal ulcer associated with erosions.     NO NEW LABS.     LABS REVIEWED PREVIOUS;   03-08-19: wbc 12.6; hgb 14.6; hct 44.8; mcv 100.7 ;plt 173; glucose 188; bun 28; creat 1.46; k+ 3.5; na++ 139; ca 8.7  03-09-19; wbc 10.5; hgb 13.2; hct 38.9; mcv 99.7; plt 157 glucose 168; bun  27; creat 1.21; k+ 3.7; na++ 141; ca 8.6 liver normal albumin 3.7 hgb a1c 5.3  03-10-19: glucose 130; bun 22; creat 1.14; k+ 3.5; an++ 137; ca 7.4  03-11-19: wbc 7.9; hgb 10.0; hct 30.4 mcv 1001.7; plt 133 03-15-19: wbc 6.2; hgb 10.4; hct 30.7; mcv 99.7; plt 207; glucose 95; bun 23; creat 0.86; k+ 3.3; na++ 137; ca 7.8; vit B 12: 300; BNP 195.0 tsh 4.413 03-21-19 k+ 3.9 03-26-19; wbc 9.9; hgb 13.8; hct 43.0; mcv 104.9; plt 286; glucose 163; bun 31; creat 1.16 ;k+ 4.2; na++ 137; ca 8.4 alk phos 192; total bili 2.0 albumin 3.3   03-27-19: glucose 113; bun 23; creat 0.84; k+ 3.9; na++ 140; ca 8.0; alk phos 179; total bili 1.6; direct bili 0.4; indirect bili 1.2 GGT 24 albumin 2.7 hepatitis panel: negative  04-02-19 wbc 7.1; hgb 13.3; hct 39.5; mcv 103.1; plt 200; glucose 108; bun 32; creat 0.94; k+ 3.6; na++ 139; ca 8.0 04-17-19: alk phos 168; direct bili 0.3; albumin 3.0  NO NEW LABS.     Review of Systems  Constitutional: Positive for malaise/fatigue and weight loss.       No appetite   Respiratory: Negative for cough and shortness of breath.   Cardiovascular: Negative for chest pain, palpitations and leg swelling.  Gastrointestinal: Negative for abdominal pain, constipation and heartburn.  Musculoskeletal: Negative for back pain, joint pain and myalgias.  Skin: Negative.   Neurological: Negative for dizziness.  Psychiatric/Behavioral: The patient is not nervous/anxious.      Physical Exam Constitutional:      General: He is not in acute distress.    Appearance: He is cachectic. He is not diaphoretic.  HENT:     Mouth/Throat:     Comments: History of vocal cord cancer removal  Neck:      Musculoskeletal: Neck supple.     Thyroid: No thyromegaly.  Cardiovascular:     Rate and Rhythm: Normal rate and regular rhythm.     Pulses: Normal pulses.     Heart sounds: Normal heart sounds.     Comments: Pace maker  Pulmonary:     Effort: Pulmonary effort is normal. No respiratory distress.     Breath sounds: Normal breath sounds.  Abdominal:     General: Bowel sounds are normal. There is no distension.     Palpations: Abdomen is soft.     Tenderness: There is no abdominal tenderness.     Comments: Status post esophageal dilation   Genitourinary:    Comments: History of TURP  Musculoskeletal:     Right lower leg: No edema.     Left lower leg: No edema.     Comments: Is able to move all extremities Is status post right hip fracture     Lymphadenopathy:     Cervical: No cervical adenopathy.  Skin:    General: Skin is warm and dry.     Comments: Bilateral lower extremities discolored    Neurological:     Mental Status: He is alert and oriented to person, place, and time.  Psychiatric:        Mood and Affect: Mood normal.        ASSESSMENT/ PLAN:   Patient is being discharged with the following home health services:  Pt/ot: to evaluate and treat as indicated for gait balance strength adl training.   Patient is being discharged with the following durable medical equipment:  3:1 commode. Standard wheelchair with elevated leg rests; cushion; brake extensions; anti-tippers to  allow him to maintain his current level of independence which cannot be achieved with a walker.  Patient has been advised to f/u with their PCP in 1-2 weeks to bring them up to date on their rehab stay.  Social services at facility was responsible for arranging this appointment.  Pt was provided with a 30 day supply of prescriptions for medications and refills must be obtained from their PCP.  For controlled substances, a more limited supply may be provided adequate until PCP appointment only.  All  medications will be provided by the receiving facility.   Time spent with patient 35 minutes: home health needs; and dme.    [name] NP [facility] Adult Medicine  Contact [phone] Monday through Friday 8am- 5pm  After hours call [phone]

## 2019-04-26 DIAGNOSIS — Z950 Presence of cardiac pacemaker: Secondary | ICD-10-CM | POA: Diagnosis not present

## 2019-04-26 DIAGNOSIS — K219 Gastro-esophageal reflux disease without esophagitis: Secondary | ICD-10-CM | POA: Diagnosis not present

## 2019-04-26 DIAGNOSIS — N400 Enlarged prostate without lower urinary tract symptoms: Secondary | ICD-10-CM | POA: Diagnosis not present

## 2019-04-26 DIAGNOSIS — E039 Hypothyroidism, unspecified: Secondary | ICD-10-CM | POA: Diagnosis not present

## 2019-04-26 DIAGNOSIS — W19XXXD Unspecified fall, subsequent encounter: Secondary | ICD-10-CM | POA: Diagnosis not present

## 2019-04-26 DIAGNOSIS — Z9181 History of falling: Secondary | ICD-10-CM | POA: Diagnosis not present

## 2019-04-26 DIAGNOSIS — I6523 Occlusion and stenosis of bilateral carotid arteries: Secondary | ICD-10-CM | POA: Diagnosis not present

## 2019-04-26 DIAGNOSIS — F0280 Dementia in other diseases classified elsewhere without behavioral disturbance: Secondary | ICD-10-CM | POA: Diagnosis not present

## 2019-04-26 DIAGNOSIS — E860 Dehydration: Secondary | ICD-10-CM | POA: Diagnosis not present

## 2019-04-26 DIAGNOSIS — M80051D Age-related osteoporosis with current pathological fracture, right femur, subsequent encounter for fracture with routine healing: Secondary | ICD-10-CM | POA: Diagnosis not present

## 2019-04-26 DIAGNOSIS — I10 Essential (primary) hypertension: Secondary | ICD-10-CM | POA: Diagnosis not present

## 2019-04-26 DIAGNOSIS — Z8521 Personal history of malignant neoplasm of larynx: Secondary | ICD-10-CM | POA: Diagnosis not present

## 2019-04-30 ENCOUNTER — Other Ambulatory Visit: Payer: Self-pay | Admitting: *Deleted

## 2019-04-30 DIAGNOSIS — F0280 Dementia in other diseases classified elsewhere without behavioral disturbance: Secondary | ICD-10-CM | POA: Diagnosis not present

## 2019-04-30 DIAGNOSIS — W19XXXD Unspecified fall, subsequent encounter: Secondary | ICD-10-CM | POA: Diagnosis not present

## 2019-04-30 DIAGNOSIS — E039 Hypothyroidism, unspecified: Secondary | ICD-10-CM | POA: Diagnosis not present

## 2019-04-30 DIAGNOSIS — I6523 Occlusion and stenosis of bilateral carotid arteries: Secondary | ICD-10-CM | POA: Diagnosis not present

## 2019-04-30 DIAGNOSIS — M80051D Age-related osteoporosis with current pathological fracture, right femur, subsequent encounter for fracture with routine healing: Secondary | ICD-10-CM | POA: Diagnosis not present

## 2019-04-30 DIAGNOSIS — I10 Essential (primary) hypertension: Secondary | ICD-10-CM | POA: Diagnosis not present

## 2019-04-30 NOTE — Patient Outreach (Signed)
Member assessed for potential [facility] Care Management needs as a benefit of [place] Medicare.  Confirmed with [facility] staff and [facility] RN, during telephonic IDT,  that Mr. Gillentine transitioned to [facility] on 04/25/19.  Plan to sign off. No identifiable [facility] Care Management needs at this time.  Marthenia Rolling, MSN-Ed, RN,BSN [place] Acute Care Coordinator [phone] [facility]) [phone]  (Toll free office)

## 2019-05-01 DIAGNOSIS — I2510 Atherosclerotic heart disease of native coronary artery without angina pectoris: Secondary | ICD-10-CM | POA: Diagnosis not present

## 2019-05-01 DIAGNOSIS — M810 Age-related osteoporosis without current pathological fracture: Secondary | ICD-10-CM | POA: Diagnosis not present

## 2019-05-01 DIAGNOSIS — E039 Hypothyroidism, unspecified: Secondary | ICD-10-CM | POA: Diagnosis not present

## 2019-05-01 DIAGNOSIS — R634 Abnormal weight loss: Secondary | ICD-10-CM | POA: Diagnosis not present

## 2019-05-01 DIAGNOSIS — I6523 Occlusion and stenosis of bilateral carotid arteries: Secondary | ICD-10-CM | POA: Diagnosis not present

## 2019-05-01 DIAGNOSIS — D510 Vitamin B12 deficiency anemia due to intrinsic factor deficiency: Secondary | ICD-10-CM | POA: Diagnosis not present

## 2019-05-01 DIAGNOSIS — E876 Hypokalemia: Secondary | ICD-10-CM | POA: Diagnosis not present

## 2019-05-01 DIAGNOSIS — M80051D Age-related osteoporosis with current pathological fracture, right femur, subsequent encounter for fracture with routine healing: Secondary | ICD-10-CM | POA: Diagnosis not present

## 2019-05-01 DIAGNOSIS — K5900 Constipation, unspecified: Secondary | ICD-10-CM | POA: Diagnosis not present

## 2019-05-01 DIAGNOSIS — F0150 Vascular dementia without behavioral disturbance: Secondary | ICD-10-CM | POA: Diagnosis not present

## 2019-05-01 DIAGNOSIS — F0280 Dementia in other diseases classified elsewhere without behavioral disturbance: Secondary | ICD-10-CM | POA: Diagnosis not present

## 2019-05-01 DIAGNOSIS — S72009D Fracture of unspecified part of neck of unspecified femur, subsequent encounter for closed fracture with routine healing: Secondary | ICD-10-CM | POA: Diagnosis not present

## 2019-05-01 DIAGNOSIS — R638 Other symptoms and signs concerning food and fluid intake: Secondary | ICD-10-CM | POA: Diagnosis not present

## 2019-05-01 DIAGNOSIS — I10 Essential (primary) hypertension: Secondary | ICD-10-CM | POA: Diagnosis not present

## 2019-05-01 DIAGNOSIS — K219 Gastro-esophageal reflux disease without esophagitis: Secondary | ICD-10-CM | POA: Diagnosis not present

## 2019-05-01 DIAGNOSIS — W19XXXD Unspecified fall, subsequent encounter: Secondary | ICD-10-CM | POA: Diagnosis not present

## 2019-05-02 DIAGNOSIS — W19XXXD Unspecified fall, subsequent encounter: Secondary | ICD-10-CM | POA: Diagnosis not present

## 2019-05-02 DIAGNOSIS — I10 Essential (primary) hypertension: Secondary | ICD-10-CM | POA: Diagnosis not present

## 2019-05-02 DIAGNOSIS — E039 Hypothyroidism, unspecified: Secondary | ICD-10-CM | POA: Diagnosis not present

## 2019-05-02 DIAGNOSIS — M80051D Age-related osteoporosis with current pathological fracture, right femur, subsequent encounter for fracture with routine healing: Secondary | ICD-10-CM | POA: Diagnosis not present

## 2019-05-02 DIAGNOSIS — I6523 Occlusion and stenosis of bilateral carotid arteries: Secondary | ICD-10-CM | POA: Diagnosis not present

## 2019-05-02 DIAGNOSIS — F0280 Dementia in other diseases classified elsewhere without behavioral disturbance: Secondary | ICD-10-CM | POA: Diagnosis not present

## 2019-05-04 ENCOUNTER — Other Ambulatory Visit: Payer: Self-pay

## 2019-05-04 ENCOUNTER — Encounter (HOSPITAL_COMMUNITY): Payer: Self-pay

## 2019-05-04 ENCOUNTER — Inpatient Hospital Stay (HOSPITAL_COMMUNITY)
Admission: EM | Admit: 2019-05-04 | Discharge: 2019-05-07 | DRG: 175 | Disposition: A | Discharge status: Home Health | Payer: Medicare Other | Source: Skilled Nursing Facility | Attending: Internal Medicine | Admitting: Internal Medicine

## 2019-05-04 ENCOUNTER — Emergency Department (HOSPITAL_COMMUNITY): Payer: Medicare Other

## 2019-05-04 DIAGNOSIS — Z888 Allergy status to other drugs, medicaments and biological substances status: Secondary | ICD-10-CM | POA: Diagnosis not present

## 2019-05-04 DIAGNOSIS — Z923 Personal history of irradiation: Secondary | ICD-10-CM

## 2019-05-04 DIAGNOSIS — I4891 Unspecified atrial fibrillation: Secondary | ICD-10-CM | POA: Diagnosis present

## 2019-05-04 DIAGNOSIS — E039 Hypothyroidism, unspecified: Secondary | ICD-10-CM | POA: Diagnosis present

## 2019-05-04 DIAGNOSIS — Z79890 Hormone replacement therapy (postmenopausal): Secondary | ICD-10-CM

## 2019-05-04 DIAGNOSIS — Z9079 Acquired absence of other genital organ(s): Secondary | ICD-10-CM

## 2019-05-04 DIAGNOSIS — Z66 Do not resuscitate: Secondary | ICD-10-CM | POA: Diagnosis present

## 2019-05-04 DIAGNOSIS — Z209 Contact with and (suspected) exposure to unspecified communicable disease: Secondary | ICD-10-CM | POA: Diagnosis not present

## 2019-05-04 DIAGNOSIS — N400 Enlarged prostate without lower urinary tract symptoms: Secondary | ICD-10-CM | POA: Diagnosis present

## 2019-05-04 DIAGNOSIS — R55 Syncope and collapse: Secondary | ICD-10-CM | POA: Diagnosis not present

## 2019-05-04 DIAGNOSIS — Z20828 Contact with and (suspected) exposure to other viral communicable diseases: Secondary | ICD-10-CM | POA: Diagnosis present

## 2019-05-04 DIAGNOSIS — S72141D Displaced intertrochanteric fracture of right femur, subsequent encounter for closed fracture with routine healing: Secondary | ICD-10-CM | POA: Diagnosis not present

## 2019-05-04 DIAGNOSIS — K219 Gastro-esophageal reflux disease without esophagitis: Secondary | ICD-10-CM | POA: Diagnosis present

## 2019-05-04 DIAGNOSIS — Z03818 Encounter for observation for suspected exposure to other biological agents ruled out: Secondary | ICD-10-CM | POA: Diagnosis not present

## 2019-05-04 DIAGNOSIS — K319 Disease of stomach and duodenum, unspecified: Secondary | ICD-10-CM | POA: Diagnosis present

## 2019-05-04 DIAGNOSIS — Z880 Allergy status to penicillin: Secondary | ICD-10-CM

## 2019-05-04 DIAGNOSIS — K269 Duodenal ulcer, unspecified as acute or chronic, without hemorrhage or perforation: Secondary | ICD-10-CM | POA: Diagnosis present

## 2019-05-04 DIAGNOSIS — I10 Essential (primary) hypertension: Secondary | ICD-10-CM | POA: Diagnosis present

## 2019-05-04 DIAGNOSIS — K1121 Acute sialoadenitis: Secondary | ICD-10-CM | POA: Diagnosis not present

## 2019-05-04 DIAGNOSIS — E43 Unspecified severe protein-calorie malnutrition: Secondary | ICD-10-CM | POA: Diagnosis present

## 2019-05-04 DIAGNOSIS — Z885 Allergy status to narcotic agent status: Secondary | ICD-10-CM | POA: Diagnosis not present

## 2019-05-04 DIAGNOSIS — F0391 Unspecified dementia with behavioral disturbance: Secondary | ICD-10-CM | POA: Diagnosis present

## 2019-05-04 DIAGNOSIS — M810 Age-related osteoporosis without current pathological fracture: Secondary | ICD-10-CM | POA: Diagnosis present

## 2019-05-04 DIAGNOSIS — X58XXXD Exposure to other specified factors, subsequent encounter: Secondary | ICD-10-CM | POA: Diagnosis present

## 2019-05-04 DIAGNOSIS — I2699 Other pulmonary embolism without acute cor pulmonale: Principal | ICD-10-CM | POA: Diagnosis present

## 2019-05-04 DIAGNOSIS — Z79899 Other long term (current) drug therapy: Secondary | ICD-10-CM

## 2019-05-04 DIAGNOSIS — R000 Tachycardia, unspecified: Secondary | ICD-10-CM | POA: Diagnosis present

## 2019-05-04 DIAGNOSIS — I2693 Single subsegmental pulmonary embolism without acute cor pulmonale: Secondary | ICD-10-CM

## 2019-05-04 DIAGNOSIS — N183 Chronic kidney disease, stage 3 unspecified: Secondary | ICD-10-CM

## 2019-05-04 DIAGNOSIS — I361 Nonrheumatic tricuspid (valve) insufficiency: Secondary | ICD-10-CM | POA: Diagnosis not present

## 2019-05-04 DIAGNOSIS — I679 Cerebrovascular disease, unspecified: Secondary | ICD-10-CM | POA: Diagnosis not present

## 2019-05-04 DIAGNOSIS — K111 Hypertrophy of salivary gland: Secondary | ICD-10-CM | POA: Diagnosis not present

## 2019-05-04 DIAGNOSIS — F0390 Unspecified dementia without behavioral disturbance: Secondary | ICD-10-CM | POA: Diagnosis not present

## 2019-05-04 DIAGNOSIS — R195 Other fecal abnormalities: Secondary | ICD-10-CM | POA: Diagnosis present

## 2019-05-04 DIAGNOSIS — Z8711 Personal history of peptic ulcer disease: Secondary | ICD-10-CM

## 2019-05-04 DIAGNOSIS — E876 Hypokalemia: Secondary | ICD-10-CM | POA: Diagnosis present

## 2019-05-04 DIAGNOSIS — R4020 Unspecified coma: Secondary | ICD-10-CM | POA: Diagnosis not present

## 2019-05-04 DIAGNOSIS — I129 Hypertensive chronic kidney disease with stage 1 through stage 4 chronic kidney disease, or unspecified chronic kidney disease: Secondary | ICD-10-CM | POA: Diagnosis present

## 2019-05-04 DIAGNOSIS — Z681 Body mass index (BMI) 19 or less, adult: Secondary | ICD-10-CM | POA: Diagnosis not present

## 2019-05-04 DIAGNOSIS — F0280 Dementia in other diseases classified elsewhere without behavioral disturbance: Secondary | ICD-10-CM | POA: Diagnosis not present

## 2019-05-04 DIAGNOSIS — Z950 Presence of cardiac pacemaker: Secondary | ICD-10-CM

## 2019-05-04 DIAGNOSIS — Z8521 Personal history of malignant neoplasm of larynx: Secondary | ICD-10-CM

## 2019-05-04 DIAGNOSIS — I6523 Occlusion and stenosis of bilateral carotid arteries: Secondary | ICD-10-CM | POA: Diagnosis present

## 2019-05-04 HISTORY — DX: Unspecified fracture of unspecified femur, initial encounter for closed fracture: S72.90XA

## 2019-05-04 LAB — PROTIME-INR
INR: 1.3 — ABNORMAL HIGH (ref 0.8–1.2)
Prothrombin Time: 16 seconds — ABNORMAL HIGH (ref 11.4–15.2)

## 2019-05-04 LAB — URINALYSIS, ROUTINE W REFLEX MICROSCOPIC
Bilirubin Urine: NEGATIVE
Glucose, UA: NEGATIVE mg/dL
Ketones, ur: 20 mg/dL — AB
Nitrite: POSITIVE — AB
Protein, ur: NEGATIVE mg/dL
Specific Gravity, Urine: 1.042 — ABNORMAL HIGH (ref 1.005–1.030)
WBC, UA: >50 WBC/hpf — ABNORMAL HIGH (ref 0–5)
pH: 5 (ref 5.0–8.0)

## 2019-05-04 LAB — SARS CORONAVIRUS 2 BY RT PCR (HOSPITAL ORDER, PERFORMED IN ~~LOC~~ HOSPITAL LAB): SARS Coronavirus 2: NEGATIVE

## 2019-05-04 LAB — COMPREHENSIVE METABOLIC PANEL
ALT: 26 U/L (ref 0–44)
AST: 43 U/L — ABNORMAL HIGH (ref 15–41)
Albumin: 3.3 g/dL — ABNORMAL LOW (ref 3.5–5.0)
Alkaline Phosphatase: 208 U/L — ABNORMAL HIGH (ref 38–126)
Anion gap: 12 (ref 5–15)
BUN: 23 mg/dL (ref 8–23)
CO2: 21 mmol/L — ABNORMAL LOW (ref 22–32)
Calcium: 8.5 mg/dL — ABNORMAL LOW (ref 8.9–10.3)
Chloride: 107 mmol/L (ref 98–111)
Creatinine, Ser: 1.21 mg/dL (ref 0.61–1.24)
GFR calc Af Amer: 60 mL/min — ABNORMAL LOW (ref >60)
GFR calc non Af Amer: 52 mL/min — ABNORMAL LOW (ref >60)
Glucose, Bld: 167 mg/dL — ABNORMAL HIGH (ref 70–99)
Potassium: 5.1 mmol/L (ref 3.5–5.1)
Sodium: 140 mmol/L (ref 135–145)
Total Bilirubin: 1.5 mg/dL — ABNORMAL HIGH (ref 0.3–1.2)
Total Protein: 6.8 g/dL (ref 6.5–8.1)

## 2019-05-04 LAB — CBC
HCT: 49.4 % (ref 39.0–52.0)
Hemoglobin: 16.1 g/dL (ref 13.0–17.0)
MCH: 33.6 pg (ref 26.0–34.0)
MCHC: 32.6 g/dL (ref 30.0–36.0)
MCV: 103.1 fL — ABNORMAL HIGH (ref 80.0–100.0)
Platelets: 158 10*3/uL (ref 150–400)
RBC: 4.79 MIL/uL (ref 4.22–5.81)
RDW: 14.4 % (ref 11.5–15.5)
WBC: 17.1 10*3/uL — ABNORMAL HIGH (ref 4.0–10.5)
nRBC: 0 % (ref 0.0–0.2)

## 2019-05-04 LAB — TROPONIN I (HIGH SENSITIVITY)
Troponin I (High Sensitivity): 17 ng/L (ref <18)
Troponin I (High Sensitivity): 16 ng/L (ref <18)

## 2019-05-04 LAB — MAGNESIUM: Magnesium: 1.7 mg/dL (ref 1.7–2.4)

## 2019-05-04 MED ORDER — ENSURE ENLIVE PO LIQD
1.0000 | Freq: Every day | ORAL | Status: DC
  Administered 2019-05-04 – 2019-05-06 (×2): 237 mL via ORAL

## 2019-05-04 MED ORDER — IOHEXOL 350 MG/ML SOLN
100.0000 mL | Freq: Once | INTRAVENOUS | Status: AC | PRN
  Administered 2019-05-04: 100 mL via INTRAVENOUS

## 2019-05-04 MED ORDER — HEPARIN BOLUS VIA INFUSION
3000.0000 [IU] | Freq: Once | INTRAVENOUS | Status: AC
  Administered 2019-05-04: 3000 [IU] via INTRAVENOUS

## 2019-05-04 MED ORDER — LEVOTHYROXINE SODIUM 75 MCG PO TABS
75.0000 ug | ORAL_TABLET | Freq: Every day | ORAL | Status: DC
  Administered 2019-05-05 – 2019-05-07 (×3): 75 ug via ORAL
  Filled 2019-05-04 (×3): qty 1

## 2019-05-04 MED ORDER — CARVEDILOL 3.125 MG PO TABS
3.1250 mg | ORAL_TABLET | Freq: Two times a day (BID) | ORAL | Status: DC
  Administered 2019-05-04 – 2019-05-07 (×6): 3.125 mg via ORAL
  Filled 2019-05-04 (×6): qty 1

## 2019-05-04 MED ORDER — POTASSIUM CHLORIDE CRYS ER 20 MEQ PO TBCR: 20.0000 meq | EXTENDED_RELEASE_TABLET | Freq: Every day | ORAL | Status: DC

## 2019-05-04 MED ORDER — SODIUM CHLORIDE 0.9 % IV BOLUS
500.0000 mL | Freq: Once | INTRAVENOUS | Status: AC
  Administered 2019-05-04: 500 mL via INTRAVENOUS

## 2019-05-04 MED ORDER — ONDANSETRON HCL 4 MG/2ML IJ SOLN: 4.0000 mg | Freq: Four times a day (QID) | INTRAMUSCULAR | Status: DC | PRN

## 2019-05-04 MED ORDER — PRO-STAT SUGAR FREE PO LIQD
30.0000 mL | Freq: Two times a day (BID) | ORAL | Status: DC
  Administered 2019-05-05 – 2019-05-07 (×4): 30 mL via ORAL
  Filled 2019-05-04 (×4): qty 30

## 2019-05-04 MED ORDER — HEPARIN (PORCINE) 25000 UT/250ML-% IV SOLN
800.0000 [IU]/h | INTRAVENOUS | Status: DC
  Administered 2019-05-04: 900 [IU]/h via INTRAVENOUS
  Administered 2019-05-05: 19:00:00 700 [IU]/h via INTRAVENOUS
  Filled 2019-05-04 (×3): qty 250

## 2019-05-04 MED ORDER — PANTOPRAZOLE SODIUM 40 MG PO TBEC
40.0000 mg | DELAYED_RELEASE_TABLET | Freq: Two times a day (BID) | ORAL | Status: DC
  Administered 2019-05-04 – 2019-05-07 (×6): 40 mg via ORAL
  Filled 2019-05-04 (×6): qty 1

## 2019-05-04 MED ORDER — CALCIUM CARBONATE ANTACID 500 MG PO CHEW
1.0000 | CHEWABLE_TABLET | Freq: Two times a day (BID) | ORAL | Status: DC
  Administered 2019-05-04: 22:00:00 200 mg via ORAL
  Filled 2019-05-04 (×5): qty 1

## 2019-05-04 MED ORDER — ACETAMINOPHEN 325 MG PO TABS: 650.0000 mg | ORAL_TABLET | ORAL | Status: DC | PRN

## 2019-05-04 MED ORDER — MIRTAZAPINE 15 MG PO TABS
7.5000 mg | ORAL_TABLET | Freq: Every day | ORAL | Status: DC
  Administered 2019-05-04 – 2019-05-06 (×3): 7.5 mg via ORAL
  Filled 2019-05-04 (×3): qty 1

## 2019-05-04 NOTE — ED Notes (Signed)
Pt was informed we need urine sample. Urinal at bedside.

## 2019-05-04 NOTE — ED Provider Notes (Signed)
[facility] Emergency Department Provider Note MRN:  [id number]  Arrival date & time: 05/04/19     Chief Complaint   Loss of Consciousness   History of Present Illness   Phillip Taylor is a 85 y.o. year-old male with a history of dementia, hypertension, carotid sinus hypersensitivity presenting to the ED with chief complaint of loss of consciousness.  Witnessed syncope reported from patient's care facility.  Staff or physical therapy was just getting started working with him when he became unresponsive.  Patient does not remember the event.  He was reportedly unresponsive for 30 to 45 seconds and then quickly returned to baseline.  He is currently without complaints, denies chest pain or shortness of breath, denies trauma or pain from the fall.  Felt unwell last night, did not eat well, had trouble sleeping.  I was unable to obtain an accurate HPI, PMH, or ROS due to the patient's dementia.  Review of Systems  Positive for syncope, poor appetite.  Patient's Health History    Past Medical History:  Diagnosis Date  . BPH (benign prostatic hyperplasia)   . Carotid sinus hypersensitivity   . Carotid stenosis, asymptomatic, bilateral   . Dementia ([place])   . Femur fracture ([place])   . GERD (gastroesophageal reflux disease)   . Hypertension   . Hypothyroidism   . Osteoporosis   . PONV (postoperative nausea and vomiting)   . Vocal cord cancer ([place]) 1995   cancer of the larynx s/p resection and XRT    Past Surgical History:  Procedure Laterality Date  . COLONOSCOPY  10/25/2012   Dr. Gala Romney: diverticulosis, multiple colon tubular adenomas removed. next TCS in 3 years.   . ESOPHAGOGASTRODUODENOSCOPY (EGD) WITH PROPOFOL N/A 04/11/2019   Procedure: ESOPHAGOGASTRODUODENOSCOPY (EGD) WITH PROPOFOL;  Surgeon: Daneil Dolin, MD;  [facility];  Service: Endoscopy;  Laterality: N/A;  2:00pm  . HERNIA REPAIR    . INTRAMEDULLARY (IM) NAIL INTERTROCHANTERIC Right 03/09/2019    Procedure: INTRAMEDULLARY (IM) NAIL INTERTROCHANTRIC;  Surgeon: Carole Civil, MD;  Location: [facility] ORS;  Service: Orthopedics;  Laterality: Right;  . MALONEY DILATION N/A 04/11/2019   Procedure: Venia Minks DILATION;  Surgeon: Daneil Dolin, MD;  [facility];  Service: Endoscopy;  Laterality: N/A;  . NISSEN FUNDOPLICATION    . PACEMAKER INSERTION    . TRANSURETHRAL RESECTION OF PROSTATE    . vocal cord cancer removal  1995    Family History  Problem Relation Age of Onset  . Colon cancer Neg Hx     Social History   Socioeconomic History  . Marital status: Married    Spouse name: Not on file  . Number of children: Not on file  . Years of education: Not on file  . Highest education level: Not on file  Occupational History  . 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 Food insecurity    Worry: Not on file    Inability: Not on file  . Transportation needs    Medical: Not on file    Non-medical: Not on file  Tobacco Use  . Smoking status: Never Smoker  . Smokeless tobacco: Never Used  Substance and Sexual Activity  . Alcohol use: No    Frequency: Never  . Drug use: No  . Sexual activity: Not on file  Lifestyle  . Physical activity    Days per week: Not on  file    Minutes per session: Not on file  . Stress: Not on file  Relationships  . Social Herbalist on phone: Not on file    Gets together: Not on file    Attends religious service: Not on file    Active member of club or organization: Not on file    Attends meetings of clubs or organizations: Not on file    Relationship status: Not on file  . Intimate partner violence    Fear of current or ex partner: Not on file    Emotionally abused: Not on file    Physically abused: Not on file    Forced sexual activity: Not on file  Other Topics Concern  . Not on file  Social History Narrative   ** Merged History Encounter **          Physical Exam  Vital Signs and Nursing Notes reviewed Vitals:   05/04/19 1400 05/04/19 1439  BP: (!) 149/84   Pulse:  (!) 107  Resp: 18 18  Temp:    SpO2:  92%    CONSTITUTIONAL: Well-appearing, NAD NEURO: Awake, alert, oriented to name, normal and symmetric strength and sensation, normal speech, no neglect EYES:  eyes equal and reactive ENT/NECK:  no LAD, no JVD CARDIO: Tachycardic rate, well-perfused, normal S1 and S2 PULM:  CTAB no wheezing or rhonchi GI/GU:  normal bowel sounds, non-distended, non-tender MSK/SPINE:  No gross deformities, no edema SKIN:  no rash, atraumatic PSYCH:  Appropriate speech and behavior  Diagnostic and Interventional Summary    EKG Interpretation  Date/Time:  Saturday May 04 2019 11:41:54 EDT Ventricular Rate:  156 PR Interval:    QRS Duration: 149 QT Interval:  383 QTC Calculation: 673 R Axis:   -105 Text Interpretation:  Afib/flut and V-paced complexes No further analysis attempted due to paced rhythm Artifact in lead(s) I II III aVR aVL aVF V1 V2 V5 V6 Confirmed by Gerlene Fee [phone]) on 05/04/2019 1:19:17 PM      Labs Reviewed  CBC - Abnormal; Notable for the following components:      Result Value   WBC 17.1 (*)    MCV 103.1 (*)    All other components within normal limits  COMPREHENSIVE METABOLIC PANEL - Abnormal; Notable for the following components:   CO2 21 (*)    Glucose, Bld 167 (*)    Calcium 8.5 (*)    Albumin 3.3 (*)    AST 43 (*)    Alkaline Phosphatase 208 (*)    Total Bilirubin 1.5 (*)    GFR calc non Af Amer 52 (*)    GFR calc Af Amer 60 (*)    All other components within normal limits  SARS CORONAVIRUS 2  MAGNESIUM  URINALYSIS, ROUTINE W REFLEX MICROSCOPIC  TROPONIN I (HIGH SENSITIVITY)  TROPONIN I (HIGH SENSITIVITY)    CT HEAD WO CONTRAST  Final Result    CT ANGIO CHEST PE W OR WO CONTRAST  Final Result    DG Chest Port 1 View  Final Result      Medications  sodium chloride 0.9 %  bolus 500 mL (0 mLs Intravenous Stopped 05/04/19 1300)  iohexol (OMNIPAQUE) 350 MG/ML injection 100 mL (100 mLs Intravenous Contrast Given 05/04/19 1353)     Procedures Critical Care Critical Care Documentation Critical care time provided by me (excluding procedures): 35 minutes  Condition necessitating critical care: Acute pulmonary embolism  Components of critical care management: reviewing of prior records,  laboratory and imaging interpretation, frequent re-examination and reassessment of vital signs, administration of IV heparin, discussion with consulting services.    ED Course and Medical Decision Making  I have reviewed the triage vital signs and the nursing notes.  Pertinent labs & imaging results that were available during my care of the patient were reviewed by me and considered in my medical decision making (see below for details).  Syncope, tachycardia on arrival with monitor suggestive of A. fib.  No prior history of A. fib upon chart review, rates between 100 and 120.  No evidence of DVT on exam.  Will obtain CT imaging to exclude pulmonary embolism.  Also considering orthostatic hypotension, metabolic disarray, UTI.  Work-up reveals high-sensitivity troponin of 17, CTA reveals acute pulmonary embolism.  No right heart strain.  Will consult hospitalist for admission.  Barth Kirks. [place] Small, [place] [email or web address]  Final Clinical Impressions(s) / ED Diagnoses     ICD-10-CM   1. Syncope  R55 DG Chest [facility] 1 View    DG Chest Port 1 View  2. Acute pulmonary embolism without acute cor pulmonale, unspecified pulmonary embolism type (HCC)  I26.99   3. Atrial fibrillation, unspecified type [facility])  I48.91     ED Discharge Orders    None         Maudie Flakes, MD 05/04/19 [phone]

## 2019-05-04 NOTE — ED Notes (Signed)
ED TO INPATIENT HANDOFF REPORT  ED Nurse Name and Phone #: Jayvan Mcshan [phone]  S Name/Age/Gender Phillip Taylor 85 y.o. male Room/Bed: APA11/APA11  Code Status   Code Status: Prior  Home/SNF/Other Nursing Home Patient oriented to: self Is this baseline? No   Triage Complete: Triage complete  Chief Complaint Near Syncope  Triage Note Pt resident at [facility].  Reports was getting ready and staff was assisting him.  Staff told ems he was unresponsive for 30-45 sec.  Pt says he doesn't remember passing out.  Pt says he didn't sleep well last night and hasn't been eating their food.  Reports had recently been at [facility] for rehab for hip fracture and was moved to [place] on 7/23.  Pt alert and oriented x 4.     Allergies Allergies  Allergen Reactions  . Prevacid [Lansoprazole] Other (See Comments)    Does not remeber  . Prilosec [Omeprazole] Other (See Comments)    Does not remeber  . Codeine Other (See Comments)    Does not know   . Penicillins Rash    .Did it involve swelling of the face/tongue/throat, SOB, or low BP? No Did it involve sudden or severe rash/hives, skin peeling, or any reaction on the inside of your mouth or nose? Yes Did you need to seek medical attention at a hospital or doctor's office? Unknown When did it last happen?Unknown If all above answers are "NO", may proceed with cephalosporin use.      Level of Care/Admitting Diagnosis ED Disposition    ED Disposition Condition [facility] Area: [facility] [[id number]]  Level of Care: Telemetry [5]  Covid Evaluation: Asymptomatic Screening Protocol (No Symptoms)  Diagnosis: Pulmonary embolism ([place]) [[id number]]  Admitting Physician: Truett Mainland [[id number]]  Attending Physician: Truett Mainland [[id number]]  Estimated length of stay: past midnight tomorrow  Certification:: I certify this patient will need inpatient services for at least 2 midnights  PT Class (Do Not Modify):  Inpatient [101]  PT Acc Code (Do Not Modify): Private [1]       B Medical/Surgery History Past Medical History:  Diagnosis Date  . BPH (benign prostatic hyperplasia)   . Carotid sinus hypersensitivity   . Carotid stenosis, asymptomatic, bilateral   . Dementia ([place])   . Femur fracture ([place])   . GERD (gastroesophageal reflux disease)   . Hypertension   . Hypothyroidism   . Osteoporosis   . PONV (postoperative nausea and vomiting)   . Vocal cord cancer ([place]) 1995   cancer of the larynx s/p resection and XRT   Past Surgical History:  Procedure Laterality Date  . COLONOSCOPY  10/25/2012   Dr. Gala Romney: diverticulosis, multiple colon tubular adenomas removed. next TCS in 3 years.   . ESOPHAGOGASTRODUODENOSCOPY (EGD) WITH PROPOFOL N/A 04/11/2019   Procedure: ESOPHAGOGASTRODUODENOSCOPY (EGD) WITH PROPOFOL;  Surgeon: Daneil Dolin, MD;  Location: [facility] END[facility];  Service: Endoscopy;  Laterality: N/A;  2:00pm  . HERNIA REPAIR    . INTRAMEDULLARY (IM) NAIL INTERTROCHANTERIC Right 03/09/2019   Procedure: INTRAMEDULLARY (IM) NAIL INTERTROCHANTRIC;  Surgeon: Carole Civil, MD;  Location: [facility] ORS;  Service: Orthopedics;  Laterality: Right;  . MALONEY DILATION N/A 04/11/2019   Procedure: Venia Minks DILATION;  Surgeon: Daneil Dolin, MD;  Location: [facility] ENDO SUITE;  Service: Endoscopy;  Laterality: N/A;  . NISSEN FUNDOPLICATION    . PACEMAKER INSERTION    . TRANSURETHRAL RESECTION OF PROSTATE    . vocal cord cancer removal  1995     A IV Location/Drains/Wounds Patient Lines/Drains/Airways Status   Active Line/Drains/Airways    Name:   Placement date:   Placement time:   Site:   Days:   Peripheral IV 03/09/19 Left Forearm   03/09/19    0005    Forearm   56   Peripheral IV 05/04/19 Left Antecubital   05/04/19    1115    Antecubital   less than 1   External Urinary Catheter   03/09/19    0554    -   56   Incision (Closed) 03/09/19 Hip   03/09/19    1121     56   Wound / Incision (Open or  Dehisced) 03/09/19 Incision - Open Hip Anterior;Right dressing present   03/09/19    1715    Hip   56          Intake/Output Last 24 hours No intake or output data in the 24 hours ending 05/04/19 1615  Labs/Imaging Results for orders placed or performed during the hospital encounter of 05/04/19 (from the past 48 hour(s))  Urinalysis, Routine w reflex microscopic     Status: Abnormal   Collection Time: 05/04/19  3:08 AM  Result Value Ref Range   Color, Urine YELLOW YELLOW   APPearance HAZY (A) CLEAR   Specific Gravity, Urine 1.042 (H) 1.005 - 1.030   pH 5.0 5.0 - 8.0   Glucose, UA NEGATIVE NEGATIVE mg/dL   Hgb urine dipstick SMALL (A) NEGATIVE   Bilirubin Urine NEGATIVE NEGATIVE   Ketones, ur 20 (A) NEGATIVE mg/dL   Protein, ur NEGATIVE NEGATIVE mg/dL   Nitrite POSITIVE (A) NEGATIVE   Leukocytes,Ua LARGE (A) NEGATIVE   RBC / HPF 0-5 0 - 5 RBC/hpf   WBC, UA >50 (H) 0 - 5 WBC/hpf   Bacteria, UA MANY (A) NONE SEEN   Squamous Epithelial / LPF 0-5 0 - 5   WBC Clumps PRESENT    Mucus PRESENT     Comment: Performed at [facility], [address], [place], [place] [address]  CBC     Status: Abnormal   Collection Time: 05/04/19 12:36 PM  Result Value Ref Range   WBC 17.1 (H) 4.0 - 10.5 K/uL   RBC 4.79 4.22 - 5.81 MIL/uL   Hemoglobin 16.1 13.0 - 17.0 g/dL   HCT 49.4 39.0 - 52.0 %   MCV 103.1 (H) 80.0 - 100.0 fL   MCH 33.6 26.0 - 34.0 pg   MCHC 32.6 30.0 - 36.0 g/dL   RDW 14.4 11.5 - 15.5 %   Platelets 158 150 - 400 K/uL   nRBC 0.0 0.0 - 0.2 %    Comment: Performed at [facility], [address] [place], [place] [address]  Comprehensive metabolic panel     Status: Abnormal   Collection Time: 05/04/19 12:36 PM  Result Value Ref Range   Sodium 140 135 - 145 mmol/L   Potassium 5.1 3.5 - 5.1 mmol/L   Chloride 107 98 - 111 mmol/L   CO2 21 (L) 22 - 32 mmol/L   Glucose, Bld 167 (H) 70 - 99 mg/dL   BUN 23 8 - 23 mg/dL   Creatinine, Ser 1.21 0.61 - 1.24 mg/dL   Calcium 8.5 (L) 8.9  - 10.3 mg/dL   Total Protein 6.8 6.5 - 8.1 g/dL   Albumin 3.3 (L) 3.5 - 5.0 g/dL   AST 43 (H) 15 - 41 U/L   ALT 26 0 - 44 U/L  Alkaline Phosphatase 208 (H) 38 - 126 U/L   Total Bilirubin 1.5 (H) 0.3 - 1.2 mg/dL   GFR calc non Af Amer 52 (L) >60 mL/min   GFR calc Af Amer 60 (L) >60 mL/min   Anion gap 12 5 - 15    Comment: Performed at [facility], [address], [place], [place] [address]  Troponin I (High Sensitivity)     Status: None   Collection Time: 05/04/19 12:36 PM  Result Value Ref Range   Troponin I (High Sensitivity) 17 <18 ng/L    Comment: (NOTE) Elevated high sensitivity troponin I (hsTnI) values and significant  changes across serial measurements may suggest ACS but many other  chronic and acute conditions are known to elevate hsTnI results.  Refer to the "Links" section for chest pain algorithms and additional  guidance. Performed at [facility], [address], [place], [place] [address]   Magnesium     Status: None   Collection Time: 05/04/19 12:36 PM  Result Value Ref Range   Magnesium 1.7 1.7 - 2.4 mg/dL    Comment: Performed at [facility], [address], [place], [place] [address]  Protime-INR     Status: Abnormal   Collection Time: 05/04/19 12:36 PM  Result Value Ref Range   Prothrombin Time 16.0 (H) 11.4 - 15.2 seconds   INR 1.3 (H) 0.8 - 1.2    Comment: (NOTE) INR goal varies based on device and disease states. Performed at [facility], [address], [place],  [address]   Troponin I (High Sensitivity)     Status: None   Collection Time: 05/04/19  2:32 PM  Result Value Ref Range   Troponin I (High Sensitivity) 16 <18 ng/L    Comment: (NOTE) Elevated high sensitivity troponin I (hsTnI) values and significant  changes across serial measurements may suggest ACS but many other  chronic and acute conditions are known to elevate hsTnI results.  Refer to the "Links" section for chest pain algorithms and additional  guidance. Performed at [facility], [address] [place],  [address]    Ct Head Wo Contrast  Result Date: 05/04/2019 CLINICAL DATA:  Syncope with questionable fall EXAM: CT HEAD WITHOUT CONTRAST TECHNIQUE: Contiguous axial images were obtained from the base of the skull through the vertex without intravenous contrast. COMPARISON:  November 24, 2004 FINDINGS: Brain: There is mild to moderate diffuse atrophy. There is a small cavum septum pellucidum, an anatomic variant. There is no intracranial mass, hemorrhage, extra-axial fluid collection, or midline shift. There is patchy small vessel disease in the centra semiovale bilaterally. There is small vessel disease in each thalamus. No acute infarct is evident. Vascular: There is no hyperdense vessel. There is calcification in each carotid siphon region. Skull: Bony calvarium appears intact. Sinuses/Orbits: There is opacification in the posterior aspect of each sphenoid sinus. There is mucosal thickening in several ethmoid air cells. There is a retention cyst in the right frontal sinus. Visualized orbits appear symmetric bilaterally. Other: Visualized mastoid air cells are clear. IMPRESSION: Mild-to-moderate atrophy with periventricular small vessel disease. Small vessel disease noted in each thalamus. No acute infarct. No mass or hemorrhage. There are foci of arterial vascular calcification. There are foci of paranasal sinus disease at several sites. Electronically Signed   By: Lowella Grip III M.D.   On: 05/04/2019 14:33   Ct Angio Chest Pe W Or Wo Contrast  Result Date: 05/04/2019 CLINICAL DATA:  Syncope and tachycardia EXAM: CT ANGIOGRAPHY CHEST WITH  CONTRAST TECHNIQUE: Multidetector CT imaging of the chest was performed using the standard protocol during bolus administration of intravenous contrast. Multiplanar CT image reconstructions and MIPs were obtained to evaluate the vascular anatomy. CONTRAST:  172mL OMNIPAQUE IOHEXOL 350 MG/ML SOLN COMPARISON:  Chest radiograph  May 04, 2019. FINDINGS: Cardiovascular: There is a focal small pulmonary embolus in the right lower lobe at the proximal posterior segment right lower lobe pulmonary artery branch. No more central pulmonary emboli are evident. There is no appreciable right heart strain; right ventricle to left ventricle diameter ratio is less than 0.9. There is aortic atherosclerosis. There is no thoracic aortic aneurysm or dissection. Visualized great vessels appear unremarkable. There is no pericardial effusion or pericardial thickening. Pacemaker present with leads attached to right atrium and right ventricle. There are scattered foci of coronary artery calcification. Mediastinum/Nodes: Thyroid is diminutive. No thyroid lesions evident. There is no demonstrable thoracic adenopathy. No esophageal lesions are evident. Lungs/Pleura: There is atelectatic change in the lung bases. There is no edema or consolidation. No pleural effusions are evident. Upper Abdomen: There is cholelithiasis. Gallbladder appears somewhat distended. There is aortic atherosclerosis as well as foci of calcification in visualized great vessels. Musculoskeletal: There is extensive thoracic arthropathy. There is bony hypertrophy at several levels in the lower thoracic spine with varying degrees of the spinal stenosis. Moderate spinal stenosis is noted at the L2-3 level. No blastic or lytic bone lesions are evident. Pacemaker device is noted superficially on the left anteriorly. Review of the MIP images confirms the above findings. IMPRESSION: 1. A single pulmonary embolus is noted in the proximal right lower lobe posterior segmental branch. No more central pulmonary embolus evident. No right heart strain. 2. No thoracic aortic aneurysm or dissection evident. There is aortic atherosclerosis. There are scattered foci of coronary artery calcification. Pacemaker leads attached to right atrium and right ventricle. 3. Posterior atelectatic change bilaterally. No  lung edema or consolidation. No evident pleural effusion. 4.  No evident adenopathy. 5.  Cholelithiasis.  Gallbladder appears somewhat distended. 6. Extensive arthropathy in the thoracic spine and upper lumbar spine. Spinal stenosis in lower thoracic and upper lumbar regions, most notably at L2-3. Critical Value/emergent results were called by telephone at the time of interpretation on 05/04/2019 at 2:30 pm to Dr. Gerlene Fee , who verbally acknowledged these results. Electronically Signed   By: Lowella Grip III M.D.   On: 05/04/2019 14:30   Dg Chest Port 1 View  Result Date: 05/04/2019 CLINICAL DATA:  unresponsive for 30-45 sec. Pt says he doesn't remember passing out. Pt says he didn't sleep well last night and hasn't been eating their food. Reports had recently been at [facility] for rehab for hip fracture and was moved to [place] on 7/23. Pt alert and oriented x 4. HISTORY OF CANCER, HTN, DEMENTIA, GERD EXAM: PORTABLE CHEST - 1 VIEW COMPARISON:  03/09/2019 FINDINGS: Lungs are clear. Heart size and mediastinal contours are within normal limits. Aortic Atherosclerosis (ICD10-170.0). Stable left subclavian dual lead transvenous pacemaker. No effusion. Advanced DJD in bilateral shoulders. IMPRESSION: No acute cardiopulmonary disease. Electronically Signed   By: Lucrezia Europe M.D.   On: 05/04/2019 13:05    Pending Labs Unresulted Labs (From admission, onward)    Start     Ordered   05/05/19 0500  CBC  Daily,   R     05/04/19 1531   05/05/19 0000  Heparin level (unfractionated)  Once-Timed,   STAT     05/04/19 1530  05/04/19 1535  SARS Coronavirus 2 [facility] order, Performed in [facility] hospital lab)  Once,   R     05/04/19 1535   Signed and Held  TSH  Tomorrow morning,   R     Signed and Held   Signed and Held  Magnesium  Tomorrow morning,   R     Signed and Held   Signed and Held  Basic metabolic panel  Tomorrow morning,   R     Signed and Held          Vitals/Pain Today's Vitals    05/04/19 1511 05/04/19 1515 05/04/19 1530 05/04/19 1600  BP:   98/79 118/87  Pulse: (!) 36 69 (!) 50   Resp: 17 18 19 18   Temp:      TempSrc:      SpO2: 96% 94% 90%   PainSc:        Isolation Precautions No active isolations  Medications Medications  heparin ADULT infusion 100 units/mL (25000 units/281mL sodium chloride 0.45%) (900 Units/hr Intravenous New Bag/Given 05/04/19 1600)  sodium chloride 0.9 % bolus 500 mL (0 mLs Intravenous Stopped 05/04/19 1300)  iohexol (OMNIPAQUE) 350 MG/ML injection 100 mL (100 mLs Intravenous Contrast Given 05/04/19 1353)  heparin bolus via infusion 3,000 Units (3,000 Units Intravenous Bolus from Bag 05/04/19 1601)    Mobility walks with person assist Moderate fall risk   Focused Assessments    R Recommendations: See Admitting Provider Note  Report given to:   Additional Note

## 2019-05-04 NOTE — ED Notes (Signed)
Per [facility] will have 300 use aviassist because pt is saying he is going to leave to go to [facility] , have to redirect and will agree but unsure if he will attempt to get out of bed. Same reported in care handoff

## 2019-05-04 NOTE — Progress Notes (Signed)
ANTICOAGULATION CONSULT NOTE - Initial Consult  Pharmacy Consult for heparin Indication: pulmonary embolus  Allergies  Allergen Reactions  . Prevacid [Lansoprazole] Other (See Comments)    Does not remeber  . Prilosec [Omeprazole] Other (See Comments)    Does not remeber  . Codeine Other (See Comments)    Does not know   . Penicillins Rash    .Did it involve swelling of the face/tongue/throat, SOB, or low BP? No Did it involve sudden or severe rash/hives, skin peeling, or any reaction on the inside of your mouth or nose? Yes Did you need to seek medical attention at a hospital or doctor's office? Unknown When did it last happen?Unknown If all above answers are "NO", may proceed with cephalosporin use.      Patient Measurements:   Heparin Dosing Weight:   Vital Signs: Temp: 97.5 F (36.4 C) (08/01 1142) Temp Source: Oral (08/01 1142) BP: 130/86 (08/01 1507) Pulse Rate: 36 (08/01 1511)  Labs: Recent Labs    05/04/19 1236 05/04/19 1432  HGB 16.1  --   HCT 49.4  --   PLT 158  --   CREATININE 1.21  --   TROPONINIHS 17 16    Estimated Creatinine Clearance: 31 mL/min (by C-G formula based on SCr of 1.21 mg/dL).   Medical History: Past Medical History:  Diagnosis Date  . BPH (benign prostatic hyperplasia)   . Carotid sinus hypersensitivity   . Carotid stenosis, asymptomatic, bilateral   . Dementia ([place])   . Femur fracture ([place])   . GERD (gastroesophageal reflux disease)   . Hypertension   . Hypothyroidism   . Osteoporosis   . PONV (postoperative nausea and vomiting)   . 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  Patient is not on anticoagulants prior to admission.  Goal of Therapy:  Heparin level 0.3-0.7 units/ml Monitor platelets by anticoagulation protocol: Yes   Plan:  Give  3000 units bolus x 1 Start heparin infusion at 900 units/hr Check anti-Xa level in 8 hours and daily while on heparin Continue to monitor H&H and platelets  Ramond Craver 05/04/2019,3:26 PM

## 2019-05-04 NOTE — H&P (Signed)
History and Physical  GEDEON BRANDOW WUJ:[id number] DOB: 05-Jun-1925 DOA: 05/04/2019  Referring physician: Dr Sedonia Small, ED physician PCP: [name], MD  Outpatient Specialists: [facility]  Patient Coming From: [place]  Chief Complaint: Syncopal episode  HPI: SHELDEN RABORN is a 85 y.o. male with a history of Carotid stenosis, dementia, femur fracture, GERD, HTN, hypothyroidism, osteoporosis, patient has pacemaker.  Patient has syncopal episode for approximately 30 to 45 seconds at [facility] while working with PT.  Patient has dementia and is unable to recall of the event.  Patient does not have any histories of syncopal episodes.  Patient was brought to the hospital for evaluation.  Emergency Department Course: Telemetry monitoring shows atrial fibrillation.  CTA shows small segmental PE.  Patient started on heparin  Review of Systems:  Unable to provide  Past Medical History:  Diagnosis Date  . BPH (benign prostatic hyperplasia)   . Carotid sinus hypersensitivity   . Carotid stenosis, asymptomatic, bilateral   . Dementia ([place])   . Femur fracture ([place])   . GERD (gastroesophageal reflux disease)   . Hypertension   . Hypothyroidism   . Osteoporosis   . PONV (postoperative nausea and vomiting)   . Vocal cord cancer ([place]) 1995   cancer of the larynx s/p resection and XRT   Past Surgical History:  Procedure Laterality Date  . COLONOSCOPY  10/25/2012   Dr. Gala Romney: diverticulosis, multiple colon tubular adenomas removed. next TCS in 3 years.   . ESOPHAGOGASTRODUODENOSCOPY (EGD) WITH PROPOFOL N/A 04/11/2019   Procedure: ESOPHAGOGASTRODUODENOSCOPY (EGD) WITH PROPOFOL;  Surgeon: Daneil Dolin, MD;  Location: [facility] END[facility];  Service: Endoscopy;  Laterality: N/A;  2:00pm  . HERNIA REPAIR    . INTRAMEDULLARY (IM) NAIL INTERTROCHANTERIC Right 03/09/2019   Procedure: INTRAMEDULLARY (IM) NAIL INTERTROCHANTRIC;  Surgeon: Carole Civil, MD;  Location: [facility] ORS;  Service: Orthopedics;   Laterality: Right;  . MALONEY DILATION N/A 04/11/2019   Procedure: Venia Minks DILATION;  Surgeon: Daneil Dolin, MD;  Location: [facility] ENDO SUITE;  Service: Endoscopy;  Laterality: N/A;  . NISSEN FUNDOPLICATION    . PACEMAKER INSERTION    . TRANSURETHRAL RESECTION OF PROSTATE    . vocal cord cancer removal  1995   Social History:  reports that he has never smoked. He has never used smokeless tobacco. He reports that he does not drink alcohol or use drugs. Patient lives at [place]  . Prevacid [Lansoprazole] Other (See Comments)    Does not remeber  . Prilosec [Omeprazole] Other (See Comments)    Does not remeber  . Codeine Other (See Comments)    Does not know   . Penicillins Rash    .Did it involve swelling of the face/tongue/throat, SOB, or low BP? No Did it involve sudden or severe rash/hives, skin peeling, or any reaction on the inside of your mouth or nose? Yes Did you need to seek medical attention at a hospital or doctor's office? Unknown When did it last happen?Unknown If all above answers are "NO", may proceed with cephalosporin use.      Family History  Problem Relation Age of Onset  . Colon cancer Neg Hx       Prior to Admission medications   Medication Sig Start Date End Date Taking? Authorizing Provider  Amino Acids-Protein Hydrolys (FEEDING SUPPLEMENT, PRO-STAT SUGAR FREE 64,) LIQD Take 30 mLs by mouth 2 (two) times daily between meals. 03/27/19  Yes [provider]  calcium carbonate (TUMS EX) 750  MG chewable tablet Chew 1 tablet by mouth 2 (two) times daily. 03/12/19  Yes [provider]  carvedilol (COREG) 3.125 MG tablet Take 3.125 mg by mouth 2 (two) times daily with a meal. 04/02/19  Yes [provider]  levothyroxine (SYNTHROID) 75 MCG tablet Take 75 mcg by mouth daily before breakfast. 04/02/19  Yes [provider]  mirtazapine (REMERON) 7.5 MG tablet Take 7.5 mg by mouth at bedtime. 04/18/19  05/04/19 Yes [provider]  NON FORMULARY Diet Type: REGULAR 03/27/19  Yes [provider]  Nutritional Supplements (ENSURE ENLIVE PO) Take 1 Bottle by mouth daily. 03/14/19  Yes [provider]  pantoprazole (PROTONIX) 40 MG tablet Take 40 mg by mouth 2 (two) times daily. 04/12/19  Yes [provider]  potassium chloride SA (K-DUR) 20 MEQ tablet Take 20 mEq by mouth daily. 03/18/19  Yes [provider]  vitamin B-12 (CYANOCOBALAMIN) 1000 MCG tablet Take 1,000 mcg by mouth daily. 03/16/19  Yes [provider]    Physical Exam: BP 118/87   Pulse (!) 50   Temp (!) 97.5 F (36.4 C) (Oral)   Resp 18   SpO2 90%   . General: Elderly Caucasian male. Awake and alert and oriented x3. No acute cardiopulmonary distress.  Marland Kitchen HEENT: Normocephalic atraumatic.  Right and left ears normal in appearance.  Pupils equal, round, reactive to light. Extraocular muscles are intact. Sclerae anicteric and noninjected.  Moist mucosal membranes. No mucosal lesions.  . Neck: Neck supple without lymphadenopathy. No masses palpated.  . Cardiovascular: Irregularly irregular rate. no murmurs, rubs, gallops auscultated. No JVD.  Marland Kitchen Respiratory: Good respiratory effort with no wheezes, rales, rhonchi. Lungs clear to auscultation bilaterally.  No accessory muscle use. . Abdomen: Soft, nontender, nondistended. Active bowel sounds. No masses or hepatosplenomegaly  . Skin: No rashes, lesions, or ulcerations.  Dry, warm to touch. 2+ dorsalis pedis and radial pulses. . Musculoskeletal: No calf or leg pain. All major joints not erythematous nontender.  No upper or lower joint deformation.  Good ROM.  No contractures  . Psychiatric: Intact judgment and insight. Pleasant and cooperative. . Neurologic: No focal neurological deficits. Strength is 5/5 and symmetric in upper and lower extremities.  Cranial nerves II through XII are grossly intact.           Labs on Admission: I have  personally reviewed following labs and imaging studies  CBC: Recent Labs  Lab 05/04/19 1236  WBC 17.1*  HGB 16.1  HCT 49.4  MCV 103.1*  PLT 322   Basic Metabolic Panel: Recent Labs  Lab 05/04/19 1236  NA 140  K 5.1  CL 107  CO2 21*  GLUCOSE 167*  BUN 23  CREATININE 1.21  CALCIUM 8.5*  MG 1.7   GFR: Estimated Creatinine Clearance: 31 mL/min (by C-G formula based on SCr of 1.21 mg/dL). Liver Function Tests: Recent Labs  Lab 05/04/19 1236  AST 43*  ALT 26  ALKPHOS 208*  BILITOT 1.5*  PROT 6.8  ALBUMIN 3.3*   No results for input(s): LIPASE, AMYLASE in the last 168 hours. No results for input(s): AMMONIA in the last 168 hours. Coagulation Profile: Recent Labs  Lab 05/04/19 1236  INR 1.3*   Cardiac Enzymes: No results for input(s): CKTOTAL, CKMB, CKMBINDEX, TROPONINI in the last 168 hours. BNP (last 3 results) No results for input(s): PROBNP in the last 8760 hours. HbA1C: No results for input(s): HGBA1C in the last 72 hours. CBG: No results for input(s): GLUCAP in  the last 168 hours. Lipid Profile: No results for input(s): CHOL, HDL, LDLCALC, TRIG, CHOLHDL, LDLDIRECT in the last 72 hours. Thyroid Function Tests: No results for input(s): TSH, T4TOTAL, FREET4, T3FREE, THYROIDAB in the last 72 hours. Anemia Panel: No results for input(s): VITAMINB12, FOLATE, FERRITIN, TIBC, IRON, RETICCTPCT in the last 72 hours. Urine analysis:    Component Value Date/Time   COLORURINE YELLOW 05/04/2019 0308   APPEARANCEUR HAZY (A) 05/04/2019 0308   LABSPEC 1.042 (H) 05/04/2019 0308   PHURINE 5.0 05/04/2019 0308   GLUCOSEU NEGATIVE 05/04/2019 0308   HGBUR SMALL (A) 05/04/2019 0308   BILIRUBINUR NEGATIVE 05/04/2019 0308   KETONESUR 20 (A) 05/04/2019 0308   PROTEINUR NEGATIVE 05/04/2019 0308   NITRITE POSITIVE (A) 05/04/2019 0308   LEUKOCYTESUR LARGE (A) 05/04/2019 0308   Sepsis Labs: @LABRCNTIP (procalcitonin:4,lacticidven:4) )No results found for this or any  previous visit (from the past 240 hour(s)).   Radiological Exams on Admission: Ct Head Wo Contrast  Result Date: 05/04/2019 CLINICAL DATA:  Syncope with questionable fall EXAM: CT HEAD WITHOUT CONTRAST TECHNIQUE: Contiguous axial images were obtained from the base of the skull through the vertex without intravenous contrast. COMPARISON:  November 24, 2004 FINDINGS: Brain: There is mild to moderate diffuse atrophy. There is a small cavum septum pellucidum, an anatomic variant. There is no intracranial mass, hemorrhage, extra-axial fluid collection, or midline shift. There is patchy small vessel disease in the centra semiovale bilaterally. There is small vessel disease in each thalamus. No acute infarct is evident. Vascular: There is no hyperdense vessel. There is calcification in each carotid siphon region. Skull: Bony calvarium appears intact. Sinuses/Orbits: There is opacification in the posterior aspect of each sphenoid sinus. There is mucosal thickening in several ethmoid air cells. There is a retention cyst in the right frontal sinus. Visualized orbits appear symmetric bilaterally. Other: Visualized mastoid air cells are clear. IMPRESSION: Mild-to-moderate atrophy with periventricular small vessel disease. Small vessel disease noted in each thalamus. No acute infarct. No mass or hemorrhage. There are foci of arterial vascular calcification. There are foci of paranasal sinus disease at several sites. Electronically Signed   By: Lowella Grip III M.D.   On: 05/04/2019 14:33   Ct Angio Chest Pe W Or Wo Contrast  Result Date: 05/04/2019 CLINICAL DATA:  Syncope and tachycardia EXAM: CT ANGIOGRAPHY CHEST WITH CONTRAST TECHNIQUE: Multidetector CT imaging of the chest was performed using the standard protocol during bolus administration of intravenous contrast. Multiplanar CT image reconstructions and MIPs were obtained to evaluate the vascular anatomy. CONTRAST:  120mL OMNIPAQUE IOHEXOL 350 MG/ML SOLN  COMPARISON:  Chest radiograph May 04, 2019. FINDINGS: Cardiovascular: There is a focal small pulmonary embolus in the right lower lobe at the proximal posterior segment right lower lobe pulmonary artery branch. No more central pulmonary emboli are evident. There is no appreciable right heart strain; right ventricle to left ventricle diameter ratio is less than 0.9. There is aortic atherosclerosis. There is no thoracic aortic aneurysm or dissection. Visualized great vessels appear unremarkable. There is no pericardial effusion or pericardial thickening. Pacemaker present with leads attached to right atrium and right ventricle. There are scattered foci of coronary artery calcification. Mediastinum/Nodes: Thyroid is diminutive. No thyroid lesions evident. There is no demonstrable thoracic adenopathy. No esophageal lesions are evident. Lungs/Pleura: There is atelectatic change in the lung bases. There is no edema or consolidation. No pleural effusions are evident. Upper Abdomen: There is cholelithiasis. Gallbladder appears somewhat distended. There is aortic atherosclerosis as well as foci of  calcification in visualized great vessels. Musculoskeletal: There is extensive thoracic arthropathy. There is bony hypertrophy at several levels in the lower thoracic spine with varying degrees of the spinal stenosis. Moderate spinal stenosis is noted at the L2-3 level. No blastic or lytic bone lesions are evident. Pacemaker device is noted superficially on the left anteriorly. Review of the MIP images confirms the above findings. IMPRESSION: 1. A single pulmonary embolus is noted in the proximal right lower lobe posterior segmental branch. No more central pulmonary embolus evident. No right heart strain. 2. No thoracic aortic aneurysm or dissection evident. There is aortic atherosclerosis. There are scattered foci of coronary artery calcification. Pacemaker leads attached to right atrium and right ventricle. 3. Posterior  atelectatic change bilaterally. No lung edema or consolidation. No evident pleural effusion. 4.  No evident adenopathy. 5.  Cholelithiasis.  Gallbladder appears somewhat distended. 6. Extensive arthropathy in the thoracic spine and upper lumbar spine. Spinal stenosis in lower thoracic and upper lumbar regions, most notably at L2-3. Critical Value/emergent results were called by telephone at the time of interpretation on 05/04/2019 at 2:30 pm to Dr. Gerlene Fee , who verbally acknowledged these results. Electronically Signed   By: Lowella Grip III M.D.   On: 05/04/2019 14:30   Dg Chest Port 1 View  Result Date: 05/04/2019 CLINICAL DATA:  unresponsive for 30-45 sec. Pt says he doesn't remember passing out. Pt says he didn't sleep well last night and hasn't been eating their food. Reports had recently been at [facility] for rehab for hip fracture and was moved to [place] on 7/23. Pt alert and oriented x 4. HISTORY OF CANCER, HTN, DEMENTIA, GERD EXAM: PORTABLE CHEST - 1 VIEW COMPARISON:  03/09/2019 FINDINGS: Lungs are clear. Heart size and mediastinal contours are within normal limits. Aortic Atherosclerosis (ICD10-170.0). Stable left subclavian dual lead transvenous pacemaker. No effusion. Advanced DJD in bilateral shoulders. IMPRESSION: No acute cardiopulmonary disease. Electronically Signed   By: Lucrezia Europe M.D.   On: 05/04/2019 13:05    EKG: Independently reviewed.  Atrial fibrillation with paced rhythm  Assessment/Plan: Principal Problem:   Pulmonary embolism (HCC) Active Problems:   Heme positive stool   Essential hypertension   Hypothyroidism   Dementia without behavioral disturbance (HCC)   Protein-calorie malnutrition, severe (HCC)   GERD without esophagitis   Atrial fibrillation [facility])    This patient was discussed with the ED physician, including pertinent vitals, physical exam findings, labs, and imaging.  We also discussed care given by the ED provider.  1. Pulmonary embolism  a. Admit b. Telemetry monitoring c. Heparin drip 2. Atrial fibrillation a. With patient's history of hypothyroidism, will check TSH b. Currently rate controlled c. Will be on anticoagulation d. Echocardiogram 3. Heme positive stool a. Hemoccult 4. Hypertension a. Continue beta-blocker 5. Hypothyroidism a. Continue Synthroid b. TSH 6. Dementia 7. Malnutrition a. Supplement 8. GERD a. Continue PPI  DVT prophylaxis: Full dose anticoagulation Consultants: None Code Status: DNR confirmed by son Family Communication: Discussed patient with his son.  Demographics in chart Disposition Plan: Patient should be able to return following evaluation   Truett Mainland, DO

## 2019-05-04 NOTE — ED Notes (Signed)
Spoke with pt's son [name] and notified of plan of care and pt's condition

## 2019-05-04 NOTE — ED Triage Notes (Signed)
Pt resident at [facility].  Reports was getting ready and staff was assisting him.  Staff told ems he was unresponsive for 30-45 sec.  Pt says he doesn't remember passing out.  Pt says he didn't sleep well last night and hasn't been eating their food.  Reports had recently been at [facility] for rehab for hip fracture and was moved to [place] on 7/23.  Pt alert and oriented x 4.

## 2019-05-05 ENCOUNTER — Inpatient Hospital Stay (HOSPITAL_COMMUNITY): Payer: Medicare Other

## 2019-05-05 DIAGNOSIS — N183 Chronic kidney disease, stage 3 unspecified: Secondary | ICD-10-CM

## 2019-05-05 DIAGNOSIS — R55 Syncope and collapse: Secondary | ICD-10-CM

## 2019-05-05 DIAGNOSIS — I2699 Other pulmonary embolism without acute cor pulmonale: Principal | ICD-10-CM

## 2019-05-05 DIAGNOSIS — F0390 Unspecified dementia without behavioral disturbance: Secondary | ICD-10-CM

## 2019-05-05 DIAGNOSIS — I361 Nonrheumatic tricuspid (valve) insufficiency: Secondary | ICD-10-CM

## 2019-05-05 LAB — CBC
HCT: 47.6 % (ref 39.0–52.0)
Hemoglobin: 15.4 g/dL (ref 13.0–17.0)
MCH: 33.5 pg (ref 26.0–34.0)
MCHC: 32.4 g/dL (ref 30.0–36.0)
MCV: 103.5 fL — ABNORMAL HIGH (ref 80.0–100.0)
Platelets: 156 10*3/uL (ref 150–400)
RBC: 4.6 MIL/uL (ref 4.22–5.81)
RDW: 14.4 % (ref 11.5–15.5)
WBC: 19.3 10*3/uL — ABNORMAL HIGH (ref 4.0–10.5)
nRBC: 0 % (ref 0.0–0.2)

## 2019-05-05 LAB — BASIC METABOLIC PANEL
Anion gap: 11 (ref 5–15)
BUN: 21 mg/dL (ref 8–23)
CO2: 21 mmol/L — ABNORMAL LOW (ref 22–32)
Calcium: 8.4 mg/dL — ABNORMAL LOW (ref 8.9–10.3)
Chloride: 109 mmol/L (ref 98–111)
Creatinine, Ser: 1.1 mg/dL (ref 0.61–1.24)
GFR calc Af Amer: >60 mL/min (ref >60)
GFR calc non Af Amer: 58 mL/min — ABNORMAL LOW (ref >60)
Glucose, Bld: 152 mg/dL — ABNORMAL HIGH (ref 70–99)
Potassium: 4 mmol/L (ref 3.5–5.1)
Sodium: 141 mmol/L (ref 135–145)

## 2019-05-05 LAB — MAGNESIUM: Magnesium: 1.7 mg/dL (ref 1.7–2.4)

## 2019-05-05 LAB — HEPARIN LEVEL (UNFRACTIONATED)
Heparin Unfractionated: 0.33 IU/mL (ref 0.30–0.70)
Heparin Unfractionated: 0.75 IU/mL — ABNORMAL HIGH (ref 0.30–0.70)
Heparin Unfractionated: 0.81 IU/mL — ABNORMAL HIGH (ref 0.30–0.70)

## 2019-05-05 LAB — MRSA PCR SCREENING: MRSA by PCR: POSITIVE — AB

## 2019-05-05 LAB — TSH: TSH: 1.795 u[IU]/mL (ref 0.350–4.500)

## 2019-05-05 LAB — ECHOCARDIOGRAM COMPLETE

## 2019-05-05 MED ORDER — SODIUM CHLORIDE 0.9 % IV SOLN
1.0000 g | Freq: Two times a day (BID) | INTRAVENOUS | Status: DC
  Administered 2019-05-05 – 2019-05-07 (×5): 1 g via INTRAVENOUS
  Filled 2019-05-05 (×5): qty 1

## 2019-05-05 MED ORDER — CHLORHEXIDINE GLUCONATE CLOTH 2 % EX PADS
6.0000 | MEDICATED_PAD | Freq: Every day | CUTANEOUS | Status: DC
  Administered 2019-05-05 – 2019-05-07 (×3): 6 via TOPICAL

## 2019-05-05 MED ORDER — MAGNESIUM SULFATE 2 GM/50ML IV SOLN
2.0000 g | Freq: Once | INTRAVENOUS | Status: AC
  Administered 2019-05-05: 2 g via INTRAVENOUS
  Filled 2019-05-05: qty 50

## 2019-05-05 MED ORDER — SODIUM CHLORIDE 0.9 % IV SOLN: 1.0000 g | Freq: Three times a day (TID) | INTRAVENOUS | Status: DC

## 2019-05-05 MED ORDER — MUPIROCIN 2 % EX OINT
1.0000 "application " | TOPICAL_OINTMENT | Freq: Two times a day (BID) | CUTANEOUS | Status: DC
  Administered 2019-05-05 – 2019-05-07 (×5): 1 via NASAL
  Filled 2019-05-05: qty 22

## 2019-05-05 MED ORDER — SODIUM CHLORIDE 0.9 % IV SOLN
INTRAVENOUS | Status: AC
  Administered 2019-05-05: 11:00:00 via INTRAVENOUS

## 2019-05-05 MED ORDER — CALCIUM CARBONATE ANTACID 500 MG PO CHEW
1.0000 | CHEWABLE_TABLET | Freq: Two times a day (BID) | ORAL | Status: DC
  Administered 2019-05-05 – 2019-05-06 (×4): 200 mg via ORAL
  Filled 2019-05-05 (×4): qty 1

## 2019-05-05 NOTE — Progress Notes (Signed)
ANTICOAGULATION CONSULT NOTE - Initial Consult  Pharmacy Consult for heparin Indication: pulmonary embolus  Allergies  Allergen Reactions  . Prevacid [Lansoprazole] Other (See Comments)    Does not remeber  . Prilosec [Omeprazole] Other (See Comments)    Does not remeber  . Codeine Other (See Comments)    Does not know   . Penicillins Rash    .Did it involve swelling of the face/tongue/throat, SOB, or low BP? No Did it involve sudden or severe rash/hives, skin peeling, or any reaction on the inside of your mouth or nose? Yes Did you need to seek medical attention at a hospital or doctor's office? Unknown When did it last happen?Unknown If all above answers are "NO", may proceed with cephalosporin use.      Patient Measurements:   Heparin Dosing Weight:   Vital Signs: Temp: 98.2 F (36.8 C) (08/02 0535) Temp Source: Oral (08/02 0535) BP: 117/58 (08/02 0535) Pulse Rate: 55 (08/02 0535)  Labs: Recent Labs    05/04/19 1236 05/04/19 1432 05/05/19 0022 05/05/19 0940  HGB 16.1  --  15.4  --   HCT 49.4  --  47.6  --   PLT 158  --  156  --   LABPROT 16.0*  --   --   --   INR 1.3*  --   --   --   HEPARINUNFRC  --   --  0.81* 0.75*  CREATININE 1.21  --  1.10  --   TROPONINIHS 17 16  --   --     Estimated Creatinine Clearance: 34.1 mL/min (by C-G formula based on SCr of 1.1 mg/dL).   Medical History: Past Medical History:  Diagnosis Date  . BPH (benign prostatic hyperplasia)   . Carotid sinus hypersensitivity   . Carotid stenosis, asymptomatic, bilateral   . Dementia ([place])   . Femur fracture ([place])   . GERD (gastroesophageal reflux disease)   . Hypertension   . Hypothyroidism   . Osteoporosis   . PONV (postoperative nausea and vomiting)   . Vocal cord cancer ([place]) 1995   cancer of the larynx s/p resection and XRT    Medications:  Medications Prior to Admission  Medication Sig Dispense Refill Last Dose  . Amino Acids-Protein Hydrolys (FEEDING  SUPPLEMENT, PRO-STAT SUGAR FREE 64,) LIQD Take 30 mLs by mouth 2 (two) times daily between meals.   05/04/2019 at 0700  . calcium carbonate (TUMS EX) 750 MG chewable tablet Chew 1 tablet by mouth 2 (two) times daily.   05/04/2019 at 0700  . carvedilol (COREG) 3.125 MG tablet Take 3.125 mg by mouth 2 (two) times daily with a meal.   05/04/2019 at 0700  . levothyroxine (SYNTHROID) 75 MCG tablet Take 75 mcg by mouth daily before breakfast.   05/04/2019 at 0600  . mirtazapine (REMERON) 7.5 MG tablet Take 7.5 mg by mouth at bedtime.   05/03/2019 at Unknown time  . NON FORMULARY Diet Type: REGULAR     . Nutritional Supplements (ENSURE ENLIVE PO) Take 1 Bottle by mouth daily.   05/03/2019 at Unknown time  . pantoprazole (PROTONIX) 40 MG tablet Take 40 mg by mouth 2 (two) times daily.   05/04/2019 at 0700  . potassium chloride SA (K-DUR) 20 MEQ tablet Take 20 mEq by mouth daily.   05/04/2019 at 0700  . vitamin B-12 (CYANOCOBALAMIN) 1000 MCG tablet Take 1,000 mcg by mouth daily.   05/04/2019 at 0700    Assessment: Pharmacy consulted to dose heparin in patient with  pulmonary embolism.  Patient is not on anticoagulants prior to admission  Initial heparin level 0.75.  Goal of Therapy:  Heparin level 0.3-0.7 units/ml Monitor platelets by anticoagulation protocol: Yes   Plan:  Decrease  heparin infusion to 700 units/hr Check anti-Xa level in 8 hours and daily while on heparin Continue to monitor H&H and platelets  Margot Ables, PharmD Clinical Pharmacist 05/05/2019 10:57 AM

## 2019-05-05 NOTE — Progress Notes (Signed)
PROGRESS NOTE  Phillip Taylor YIF:[id number] DOB: 31-Dec-1924 DOA: 05/04/2019 PCP: [name], MD  Brief History:  85 year old male with a history of carotid sinus hypersensitivity status post PPM, dementia, hypertension, hypothyroidism, GERD, BPH status post TURP presenting after syncopal episode at [facility].  The patient was getting ready for physical therapy when he lost consciousness for period of 30 to 45 seconds.  Unfortunately, additional history is not available secondary to patient's dementia.  The patient himself denies any dizziness, chest pain, shortness breath, nausea, vomiting, diarrhea, abdominal pain.  Notably, the patient recently had an EGD performed on 04/11/2019 which showed erosive gastropathy, Schatzki's ring which was dilated, and a large duodenal ulcer.  He was brought to the hospital for further evaluation secondary to syncope.  Work-up including a CT angiogram of the chest in the emergency department revealed a right lower lobe pulmonary embolus with no heart strain.  The patient was started on IV heparin.  Assessment/Plan: Acute Pulmonary Embolus -continue IV heparin -Echo  Left submandibular mass -CT neck soft tissues -Suspect parotitis/sialoadenitis  Tachycardia -Personally reviewed telemetry -Patient has sinus tachycardia with frequent PACs -Likely due to the patient's pulmonary embolus -Check TSH  Leukocytosis -Obtain UA and urine culture -May be related to the patient's sialoadenitis as well as stress demargination  Recent right femur intratrochanteric fracture -Status post gamma nail 03/09/2019 -The patient was discharged to the [facility] for rehab and returned subsequently back to [facility] -Patient had been receiving DVT prophylaxis postoperatively  Syncope -check orthostatics -monitor on telemetry -echo -may be related to acute PE vs dysrhythmia  Essential hypertension -Continue carvedilol  Erosive gastropathy/duodenal  ulcer -Continue Protonix  Dementia with behavioral disturbance -Patient is at risk for hospital delirium  Hypothyroidism -Continue Synthroid  CKD stage III -Baseline creatinine 0.8-1.1     Disposition Plan:  [place] in 2-3 days  Family Communication:   Son updated on phone 8/2  Consultants:  none  Code Status:  DNR  DVT Prophylaxis:  IV Heparin    Procedures: As Listed in Progress Note Above  Antibiotics: None  RN Pressure Injury Documentation:        Subjective: ROS is limited due to pt's dementia, but pt denies headache, cp, sob, n/v/d, abd pain  Objective: Vitals:   05/04/19 1741 05/04/19 1957 05/04/19 2102 05/05/19 0535  BP: 137/69 127/68 107/65 (!) 117/58  Pulse: 89 86 85 (!) 55  Resp: 19  19 19   Temp: 97.9 F (36.6 C)  97.7 F (36.5 C) 98.2 F (36.8 C)  TempSrc: Oral  Oral Oral  SpO2: 99%   98%    Intake/Output Summary (Last 24 hours) at 05/05/2019 8676 Last data filed at 05/05/2019 0300 Gross per 24 hour  Intake 177.05 ml  Output --  Net 177.05 ml   Weight change:  Exam:   General:  Pt is alert, follows commands appropriately, not in acute distress  HEENT: No icterus, No thrush, left submandibular mass with tenderness to palpation, [place]/AT  Cardiovascular: irregular,  S1/S2, no rubs, no gallops  Respiratory: fine bibasilar rales  Abdomen: Soft/+BS, non tender, non distended, no guarding  Extremities: No edema, No lymphangitis, No petechiae, No rashes, no synovitis   Data Reviewed: I have personally reviewed following labs and imaging studies Basic Metabolic Panel: Recent Labs  Lab 05/04/19 1236 05/05/19 0022  NA 140 141  K 5.1 4.0  CL 107 109  CO2 21* 21*  GLUCOSE 167* 152*  BUN  23 21  CREATININE 1.21 1.10  CALCIUM 8.5* 8.4*  MG 1.7 1.7   Liver Function Tests: Recent Labs  Lab 05/04/19 1236  AST 43*  ALT 26  ALKPHOS 208*  BILITOT 1.5*  PROT 6.8  ALBUMIN 3.3*   No results for input(s): LIPASE, AMYLASE in the  last 168 hours. No results for input(s): AMMONIA in the last 168 hours. Coagulation Profile: Recent Labs  Lab 05/04/19 1236  INR 1.3*   CBC: Recent Labs  Lab 05/04/19 1236 05/05/19 0022  WBC 17.1* 19.3*  HGB 16.1 15.4  HCT 49.4 47.6  MCV 103.1* 103.5*  PLT 158 156   Cardiac Enzymes: No results for input(s): CKTOTAL, CKMB, CKMBINDEX, TROPONINI in the last 168 hours. BNP: Invalid input(s): POCBNP CBG: No results for input(s): GLUCAP in the last 168 hours. HbA1C: No results for input(s): HGBA1C in the last 72 hours. Urine analysis:    Component Value Date/Time   COLORURINE YELLOW 05/04/2019 0308   APPEARANCEUR HAZY (A) 05/04/2019 0308   LABSPEC 1.042 (H) 05/04/2019 0308   PHURINE 5.0 05/04/2019 0308   GLUCOSEU NEGATIVE 05/04/2019 0308   HGBUR SMALL (A) 05/04/2019 0308   BILIRUBINUR NEGATIVE 05/04/2019 0308   KETONESUR 20 (A) 05/04/2019 0308   PROTEINUR NEGATIVE 05/04/2019 0308   NITRITE POSITIVE (A) 05/04/2019 0308   LEUKOCYTESUR LARGE (A) 05/04/2019 0308   Sepsis Labs: @LABRCNTIP (procalcitonin:4,lacticidven:4) ) Recent Results (from the past 240 hour(s))  SARS Coronavirus 2 [facility] order, Performed in [place] hospital lab)     Status: None   Collection Time: 05/04/19  3:35 PM  Result Value Ref Range Status   SARS Coronavirus 2 NEGATIVE NEGATIVE Final    Comment: (NOTE) If result is NEGATIVE SARS-CoV-2 target nucleic acids are NOT DETECTED. The SARS-CoV-2 RNA is generally detectable in upper and lower  respiratory specimens during the acute phase of infection. The lowest  concentration of SARS-CoV-2 viral copies this assay can detect is 250  copies / mL. A negative result does not preclude SARS-CoV-2 infection  and should not be used as the sole basis for treatment or other  patient management decisions.  A negative result may occur with  improper specimen collection / handling, submission of specimen other  than nasopharyngeal swab, presence of viral  mutation(s) within the  areas targeted by this assay, and inadequate number of viral copies  (<250 copies / mL). A negative result must be combined with clinical  observations, patient history, and epidemiological information. If result is POSITIVE SARS-CoV-2 target nucleic acids are DETECTED. The SARS-CoV-2 RNA is generally detectable in upper and lower  respiratory specimens dur ing the acute phase of infection.  Positive  results are indicative of active infection with SARS-CoV-2.  Clinical  correlation with patient history and other diagnostic information is  necessary to determine patient infection status.  Positive results do  not rule out bacterial infection or co-infection with other viruses. If result is PRESUMPTIVE POSTIVE SARS-CoV-2 nucleic acids MAY BE PRESENT.   A presumptive positive result was obtained on the submitted specimen  and confirmed on repeat testing.  While 2019 novel coronavirus  (SARS-CoV-2) nucleic acids may be present in the submitted sample  additional confirmatory testing may be necessary for epidemiological  and / or clinical management purposes  to differentiate between  SARS-CoV-2 and other Sarbecovirus currently known to infect humans.  If clinically indicated additional testing with an alternate test  methodology [phone]) is advised. The SARS-CoV-2 RNA is generally  detectable in upper and lower  respiratory sp ecimens during the acute  phase of infection. The expected result is Negative. Fact Sheet for Patients:  StrictlyIdeas.no Fact Sheet for Healthcare Providers: BankingDealers.co.za This test is not yet approved or cleared by the [place] FDA and has been authorized for detection and/or diagnosis of SARS-CoV-2 by FDA under an Emergency Use Authorization (EUA).  This EUA will remain in effect (meaning this test can be used) for the duration of the COVID-19 declaration under Section 564(b)(1)  of the Act, 21 U.S.C. section 360bbb-3(b)(1), unless the authorization is terminated or revoked sooner. Performed at [facility], [address] [place], [place] [address]   MRSA PCR Screening     Status: Abnormal   Collection Time: 05/04/19 10:58 PM   Specimen: Nasal Mucosa; Nasopharyngeal  Result Value Ref Range Status   MRSA by PCR POSITIVE (A) NEGATIVE Final    Comment:        The GeneXpert MRSA Assay (FDA approved for NASAL specimens only), is one component of a comprehensive MRSA colonization surveillance program. It is not intended to diagnose MRSA infection nor to guide or monitor treatment for MRSA infections. RESULT CALLED TO, READ BACK BY AND VERIFIED WITH: TATE,R.  ON 05/05/2019 AT 0554 BY EVA Performed at [facility], [address] [place], [place] [address]      Scheduled Meds:  calcium carbonate  1 tablet Oral BID   carvedilol  3.125 mg Oral BID WC   Chlorhexidine Gluconate Cloth  6 each Topical Q0600   feeding supplement (ENSURE ENLIVE)  1 Bottle Oral Daily   feeding supplement (PRO-STAT SUGAR FREE 64)  30 mL Oral BID BM   levothyroxine  75 mcg Oral Q0600   mirtazapine  7.5 mg Oral QHS   mupirocin ointment  1 application Nasal BID   pantoprazole  40 mg Oral BID   Continuous Infusions:  heparin 800 Units/hr (05/05/19 0127)    Procedures/Studies: Ct Head Wo Contrast  Result Date: 05/04/2019 CLINICAL DATA:  Syncope with questionable fall EXAM: CT HEAD WITHOUT CONTRAST TECHNIQUE: Contiguous axial images were obtained from the base of the skull through the vertex without intravenous contrast. COMPARISON:  November 24, 2004 FINDINGS: Brain: There is mild to moderate diffuse atrophy. There is a small cavum septum pellucidum, an anatomic variant. There is no intracranial mass, hemorrhage, extra-axial fluid collection, or midline shift. There is patchy small vessel disease in the centra semiovale bilaterally. There is small vessel disease in each  thalamus. No acute infarct is evident. Vascular: There is no hyperdense vessel. There is calcification in each carotid siphon region. Skull: Bony calvarium appears intact. Sinuses/Orbits: There is opacification in the posterior aspect of each sphenoid sinus. There is mucosal thickening in several ethmoid air cells. There is a retention cyst in the right frontal sinus. Visualized orbits appear symmetric bilaterally. Other: Visualized mastoid air cells are clear. IMPRESSION: Mild-to-moderate atrophy with periventricular small vessel disease. Small vessel disease noted in each thalamus. No acute infarct. No mass or hemorrhage. There are foci of arterial vascular calcification. There are foci of paranasal sinus disease at several sites. Electronically Signed   By: Lowella Grip III M.D.   On: 05/04/2019 14:33   Ct Angio Chest Pe W Or Wo Contrast  Result Date: 05/04/2019 CLINICAL DATA:  Syncope and tachycardia EXAM: CT ANGIOGRAPHY CHEST WITH CONTRAST TECHNIQUE: Multidetector CT imaging of the chest was performed using the standard protocol during bolus administration of intravenous contrast. Multiplanar CT image reconstructions and MIPs were obtained to evaluate the  vascular anatomy. CONTRAST:  164mL OMNIPAQUE IOHEXOL 350 MG/ML SOLN COMPARISON:  Chest radiograph May 04, 2019. FINDINGS: Cardiovascular: There is a focal small pulmonary embolus in the right lower lobe at the proximal posterior segment right lower lobe pulmonary artery branch. No more central pulmonary emboli are evident. There is no appreciable right heart strain; right ventricle to left ventricle diameter ratio is less than 0.9. There is aortic atherosclerosis. There is no thoracic aortic aneurysm or dissection. Visualized great vessels appear unremarkable. There is no pericardial effusion or pericardial thickening. Pacemaker present with leads attached to right atrium and right ventricle. There are scattered foci of coronary artery calcification.  Mediastinum/Nodes: Thyroid is diminutive. No thyroid lesions evident. There is no demonstrable thoracic adenopathy. No esophageal lesions are evident. Lungs/Pleura: There is atelectatic change in the lung bases. There is no edema or consolidation. No pleural effusions are evident. Upper Abdomen: There is cholelithiasis. Gallbladder appears somewhat distended. There is aortic atherosclerosis as well as foci of calcification in visualized great vessels. Musculoskeletal: There is extensive thoracic arthropathy. There is bony hypertrophy at several levels in the lower thoracic spine with varying degrees of the spinal stenosis. Moderate spinal stenosis is noted at the L2-3 level. No blastic or lytic bone lesions are evident. Pacemaker device is noted superficially on the left anteriorly. Review of the MIP images confirms the above findings. IMPRESSION: 1. A single pulmonary embolus is noted in the proximal right lower lobe posterior segmental branch. No more central pulmonary embolus evident. No right heart strain. 2. No thoracic aortic aneurysm or dissection evident. There is aortic atherosclerosis. There are scattered foci of coronary artery calcification. Pacemaker leads attached to right atrium and right ventricle. 3. Posterior atelectatic change bilaterally. No lung edema or consolidation. No evident pleural effusion. 4.  No evident adenopathy. 5.  Cholelithiasis.  Gallbladder appears somewhat distended. 6. Extensive arthropathy in the thoracic spine and upper lumbar spine. Spinal stenosis in lower thoracic and upper lumbar regions, most notably at L2-3. Critical Value/emergent results were called by telephone at the time of interpretation on 05/04/2019 at 2:30 pm to Dr. Gerlene Fee , who verbally acknowledged these results. Electronically Signed   By: Lowella Grip III M.D.   On: 05/04/2019 14:30   Dg Chest Port 1 View  Result Date: 05/04/2019 CLINICAL DATA:  unresponsive for 30-45 sec. Pt says he doesn't  remember passing out. Pt says he didn't sleep well last night and hasn't been eating their food. Reports had recently been at [facility] for rehab for hip fracture and was moved to [place] on 7/23. Pt alert and oriented x 4. HISTORY OF CANCER, HTN, DEMENTIA, GERD EXAM: PORTABLE CHEST - 1 VIEW COMPARISON:  03/09/2019 FINDINGS: Lungs are clear. Heart size and mediastinal contours are within normal limits. Aortic Atherosclerosis (ICD10-170.0). Stable left subclavian dual lead transvenous pacemaker. No effusion. Advanced DJD in bilateral shoulders. IMPRESSION: No acute cardiopulmonary disease. Electronically Signed   By: Lucrezia Europe M.D.   On: 05/04/2019 13:05   Dg Hip Unilat With Pelvis 2-3 Views Right  Result Date: 04/10/2019 [facility] Post[facility] fracture intratrochanteric status post internal fixation x-ray compared to June 6 Right intertrochanteric fracture pelvis film AP lateral right hip Standard gamma nail distal locking screws lag screw.  Tip to apex today's distance looks good and maintained from surgery fracture looks reduced No complications impression stable fracture fixation right hip    Birt Reinoso, DO  [facility] Pager [phone]  If 7PM-7AM, please contact night-coverage [email or web address] Password [email or web address]  05/05/2019, 8:26 AM   LOS: 1 day

## 2019-05-05 NOTE — Progress Notes (Signed)
Pt to CT via bed. Condom cath intact. IV heparin continues per order.

## 2019-05-05 NOTE — Progress Notes (Addendum)
Called patient's son Jerico Grisso and provided update on patient status.   Pt refused lunch tray despite multiple attempts my different staff members to get him to eat. Pt has slept most of afternoon. When dinner tray delivered, offered patient spoonful of mashed potatoes and gravy, which he spit out and said, "That is horrible!". Pt did take approx. 24ml of sweet tea to drink and took his 1700 po meds in 3 spoonsfuls of applesauce. Unable to get patient to take any other oral foods or fluids.

## 2019-05-05 NOTE — Progress Notes (Signed)
ANTICOAGULATION CONSULT NOTE -   Pharmacy Consult for heparin Indication: pulmonary embolus  Allergies  Allergen Reactions  . Prevacid [Lansoprazole] Other (See Comments)    Does not remeber  . Prilosec [Omeprazole] Other (See Comments)    Does not remeber  . Codeine Other (See Comments)    Does not know   . Penicillins Rash    .Did it involve swelling of the face/tongue/throat, SOB, or low BP? No Did it involve sudden or severe rash/hives, skin peeling, or any reaction on the inside of your mouth or nose? Yes Did you need to seek medical attention at a hospital or doctor's office? Unknown When did it last happen?Unknown If all above answers are "NO", may proceed with cephalosporin use.      Patient Measurements:   Heparin Dosing Weight:   Vital Signs: Temp: 98.4 F (36.9 C) (08/02 1315) Temp Source: Oral (08/02 1315) BP: 109/64 (08/02 1657) Pulse Rate: 93 (08/02 1657)  Labs: Recent Labs    05/04/19 1236 05/04/19 1432 05/05/19 0022 05/05/19 0940 05/05/19 1929  HGB 16.1  --  15.4  --   --   HCT 49.4  --  47.6  --   --   PLT 158  --  156  --   --   LABPROT 16.0*  --   --   --   --   INR 1.3*  --   --   --   --   HEPARINUNFRC  --   --  0.81* 0.75* 0.33  CREATININE 1.21  --  1.10  --   --   TROPONINIHS 17 16  --   --   --     Estimated Creatinine Clearance: 34.1 mL/min (by C-G formula based on SCr of 1.1 mg/dL).   Medical History: Past Medical History:  Diagnosis Date  . BPH (benign prostatic hyperplasia)   . Carotid sinus hypersensitivity   . Carotid stenosis, asymptomatic, bilateral   . Dementia ([place])   . Femur fracture ([place])   . GERD (gastroesophageal reflux disease)   . Hypertension   . Hypothyroidism   . Osteoporosis   . PONV (postoperative nausea and vomiting)   . Vocal cord cancer ([place]) 1995   cancer of the larynx s/p resection and XRT    Medications:  Medications Prior to Admission  Medication Sig Dispense Refill Last Dose  . Amino  Acids-Protein Hydrolys (FEEDING SUPPLEMENT, PRO-STAT SUGAR FREE 64,) LIQD Take 30 mLs by mouth 2 (two) times daily between meals.   05/04/2019 at 0700  . calcium carbonate (TUMS EX) 750 MG chewable tablet Chew 1 tablet by mouth 2 (two) times daily.   05/04/2019 at 0700  . carvedilol (COREG) 3.125 MG tablet Take 3.125 mg by mouth 2 (two) times daily with a meal.   05/04/2019 at 0700  . levothyroxine (SYNTHROID) 75 MCG tablet Take 75 mcg by mouth daily before breakfast.   05/04/2019 at 0600  . mirtazapine (REMERON) 7.5 MG tablet Take 7.5 mg by mouth at bedtime.   05/03/2019 at Unknown time  . NON FORMULARY Diet Type: REGULAR     . Nutritional Supplements (ENSURE ENLIVE PO) Take 1 Bottle by mouth daily.   05/03/2019 at Unknown time  . pantoprazole (PROTONIX) 40 MG tablet Take 40 mg by mouth 2 (two) times daily.   05/04/2019 at 0700  . potassium chloride SA (K-DUR) 20 MEQ tablet Take 20 mEq by mouth daily.   05/04/2019 at 0700  . vitamin B-12 (CYANOCOBALAMIN) 1000 MCG tablet  Take 1,000 mcg by mouth daily.   05/04/2019 at 0700    Assessment: Pharmacy consulted to dose heparin in patient with pulmonary embolism.  Patient is not on anticoagulants prior to admission  Initial heparin level 0.33.  Goal of Therapy:  Heparin level 0.3-0.7 units/ml Monitor platelets by anticoagulation protocol: Yes   Plan:  Continue heparin infusion at 700 units/hr Check anti-Xa level in 8 hours and daily while on heparin Continue to monitor H&H and platelets  Margot Ables, PharmD Clinical Pharmacist 05/05/2019 9:09 PM

## 2019-05-05 NOTE — Progress Notes (Signed)
ANTICOAGULATION CONSULT NOTE - Initial Consult  Pharmacy Consult for heparin Indication: pulmonary embolus  Allergies  Allergen Reactions  . Prevacid [Lansoprazole] Other (See Comments)    Does not remeber  . Prilosec [Omeprazole] Other (See Comments)    Does not remeber  . Codeine Other (See Comments)    Does not know   . Penicillins Rash    .Did it involve swelling of the face/tongue/throat, SOB, or low BP? No Did it involve sudden or severe rash/hives, skin peeling, or any reaction on the inside of your mouth or nose? Yes Did you need to seek medical attention at a hospital or doctor's office? Unknown When did it last happen?Unknown If all above answers are "NO", may proceed with cephalosporin use.      Patient Measurements:   Heparin Dosing Weight:   Vital Signs: Temp: 97.7 F (36.5 C) (08/01 2102) Temp Source: Oral (08/01 2102) BP: 107/65 (08/01 2102) Pulse Rate: 85 (08/01 2102)  Labs: Recent Labs    05/04/19 1236 05/04/19 1432 05/05/19 0022  HGB 16.1  --  15.4  HCT 49.4  --  47.6  PLT 158  --  156  LABPROT 16.0*  --   --   INR 1.3*  --   --   HEPARINUNFRC  --   --  0.81*  CREATININE 1.21  --   --   TROPONINIHS 17 16  --     Estimated Creatinine Clearance: 31 mL/min (by C-G formula based on SCr of 1.21 mg/dL).   Medical History: Past Medical History:  Diagnosis Date  . BPH (benign prostatic hyperplasia)   . Carotid sinus hypersensitivity   . Carotid stenosis, asymptomatic, bilateral   . Dementia ([place])   . Femur fracture ([place])   . GERD (gastroesophageal reflux disease)   . Hypertension   . Hypothyroidism   . Osteoporosis   . PONV (postoperative nausea and vomiting)   . 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 Amino Acids-Protein Hydrolys (FEEDING SUPPLEMENT, PRO-STAT SUGAR FREE 64,) LIQD Take 30 mLs by mouth 2  (two) times daily between meals.   05/04/2019 at 0700  . calcium carbonate (TUMS EX) 750 MG chewable tablet Chew 1 tablet by mouth 2 (two) times daily.   05/04/2019 at 0700  . carvedilol (COREG) 3.125 MG tablet Take 3.125 mg by mouth 2 (two) times daily with a meal.   05/04/2019 at 0700  . levothyroxine (SYNTHROID) 75 MCG tablet Take 75 mcg by mouth daily before breakfast.   05/04/2019 at 0600  . mirtazapine (REMERON) 7.5 MG tablet Take 7.5 mg by mouth at bedtime.   05/03/2019 at Unknown time  . NON FORMULARY Diet Type: REGULAR     . Nutritional Supplements (ENSURE ENLIVE PO) Take 1 Bottle by mouth daily.   05/03/2019 at Unknown time  . pantoprazole (PROTONIX) 40 MG tablet Take 40 mg by mouth 2 (two) times daily.   05/04/2019 at 0700  . potassium chloride SA (K-DUR) 20 MEQ tablet Take 20 mEq by mouth daily.   05/04/2019 at 0700  . vitamin B-12 (CYANOCOBALAMIN) 1000 MCG tablet Take 1,000 mcg by mouth daily.   05/04/2019 at 0700    Assessment: Pharmacy consulted to dose heparin in patient with pulmonary embolism.  Patient is not on anticoagulants prior to admission  Initial heparin level 0.82.  Goal of Therapy:  Heparin  level 0.3-0.7 units/ml Monitor platelets by anticoagulation protocol: Yes   Plan:  Decrease  heparin infusion to 800 units/hr Check anti-Xa level in 8 hours and daily while on heparin Continue to monitor H&H and platelets  Tangy Drozdowski A. Levada Dy, PharmD, BCPS, FNKF Clinical Pharmacist [place] Please utilize Amion for appropriate phone number to reach the unit pharmacist ([place])   05/05/2019,1:21 AM

## 2019-05-05 NOTE — Progress Notes (Signed)
*  PRELIMINARY RESULTS* Echocardiogram 2D Echocardiogram has been performed.  Phillip Taylor 05/05/2019, 2:04 PM

## 2019-05-06 DIAGNOSIS — F0280 Dementia in other diseases classified elsewhere without behavioral disturbance: Secondary | ICD-10-CM

## 2019-05-06 DIAGNOSIS — I10 Essential (primary) hypertension: Secondary | ICD-10-CM

## 2019-05-06 DIAGNOSIS — K1121 Acute sialoadenitis: Secondary | ICD-10-CM

## 2019-05-06 LAB — CBC
HCT: 40.5 % (ref 39.0–52.0)
Hemoglobin: 13.2 g/dL (ref 13.0–17.0)
MCH: 33.2 pg (ref 26.0–34.0)
MCHC: 32.6 g/dL (ref 30.0–36.0)
MCV: 102 fL — ABNORMAL HIGH (ref 80.0–100.0)
Platelets: 140 10*3/uL — ABNORMAL LOW (ref 150–400)
RBC: 3.97 MIL/uL — ABNORMAL LOW (ref 4.22–5.81)
RDW: 14.4 % (ref 11.5–15.5)
WBC: 13.7 10*3/uL — ABNORMAL HIGH (ref 4.0–10.5)
nRBC: 0 % (ref 0.0–0.2)

## 2019-05-06 LAB — BASIC METABOLIC PANEL
Anion gap: 10 (ref 5–15)
BUN: 21 mg/dL (ref 8–23)
CO2: 21 mmol/L — ABNORMAL LOW (ref 22–32)
Calcium: 7.8 mg/dL — ABNORMAL LOW (ref 8.9–10.3)
Chloride: 112 mmol/L — ABNORMAL HIGH (ref 98–111)
Creatinine, Ser: 1.02 mg/dL (ref 0.61–1.24)
GFR calc Af Amer: >60 mL/min (ref >60)
GFR calc non Af Amer: >60 mL/min (ref >60)
Glucose, Bld: 111 mg/dL — ABNORMAL HIGH (ref 70–99)
Potassium: 3.3 mmol/L — ABNORMAL LOW (ref 3.5–5.1)
Sodium: 143 mmol/L (ref 135–145)

## 2019-05-06 LAB — MAGNESIUM: Magnesium: 2.1 mg/dL (ref 1.7–2.4)

## 2019-05-06 LAB — HEPARIN LEVEL (UNFRACTIONATED)
Heparin Unfractionated: 0.24 IU/mL — ABNORMAL LOW (ref 0.30–0.70)
Heparin Unfractionated: 0.4 IU/mL (ref 0.30–0.70)

## 2019-05-06 MED ORDER — POTASSIUM CHLORIDE CRYS ER 20 MEQ PO TBCR
40.0000 meq | EXTENDED_RELEASE_TABLET | Freq: Once | ORAL | Status: AC
  Administered 2019-05-06: 18:00:00 40 meq via ORAL
  Filled 2019-05-06: qty 2

## 2019-05-06 MED ORDER — HEPARIN BOLUS VIA INFUSION
850.0000 [IU] | Freq: Once | INTRAVENOUS | Status: AC
  Administered 2019-05-06: 09:00:00 850 [IU] via INTRAVENOUS
  Filled 2019-05-06: qty 850

## 2019-05-06 NOTE — Progress Notes (Signed)
Initial Nutrition Assessment  DOCUMENTATION CODES:   Severe malnutrition in context of chronic illness, Underweight  INTERVENTION:  -Updated weight; current wt >7days Continue:  -Ensure Enlive po BID, each supplement provides 350 kcal and 20 grams of protein -Pro-stat po BID, each supplement provides 100 kcal and 15 grams of protien -Magic cup TID with meals, each supplement provides 290 kcal and 9 grams of protein -Remeron 7.5mg  at bedtime -MVI   NUTRITION DIAGNOSIS:   Severe Malnutrition related to chronic illness(dementia) as evidenced by energy intake < or equal to 75% for > or equal to 1 month, percent weight loss, per patient/family report(per review of facility notes).  GOAL:   Patient will meet greater than or equal to 90% of their needs, Weight gain   MONITOR:   PO intake, Supplement acceptance, Weight trends, I & O's, Labs  REASON FOR ASSESSMENT:   Malnutrition Screening Tool    ASSESSMENT:  85 year old male with medical history significant of carotid sinus hypersensitivity s/p PPM, dementia, HTN, hypothyroidism, GERD, presented to ED after syncopal episode at [facility]. CT angiogram of chest revealed rt lower lobe pulmonary embolus  7/9 - EGD: maloney dialation 7/23 - Patient discharged from [facility] where he was admitted for short term rehab s/p right femur fracture. Per chart review, pt had difficulty with his appetite and continued to lose weight throughout rehab stay  Patient eating 0-20% of meals at this time; appetite stimulant given at bedtime  Weight history reviewed: 56.3 kg (wt >7 days) RD to order current wt. Patients weight continue to trend down with a noted 9.46 lb loss (7.1%) occurring over the past month; severe for time frame.   Patient meets criteria for severe malnutrition in the context of chronic illness given history of poor po and wt losses in the past month  7/13 - 58.9 kg 7/2 - 60.6 6/9 - 68.9 kg   Medications reviewed and  include: Tums, coreg, remeron, synthroid, protonix  Labs: reviewed   NUTRITION - FOCUSED PHYSICAL EXAM: Unable to complete at this time - PT with patient   Diet Order:   Diet Order            Diet Heart Room service appropriate? Yes; Fluid consistency: Thin  Diet effective now              EDUCATION NEEDS:   No education needs have been identified at this time  Skin:  Skin Assessment: Reviewed RN Assessment  Last BM:  8/2 (small smear per RN note)  Height:   Ht Readings from Last 1 Encounters:  04/23/19 6' (1.829 m)    Weight:   Wt Readings from Last 1 Encounters:  04/23/19 56.3 kg    Ideal Body Weight:  80.9 kg  BMI:  There is no height or weight on file to calculate BMI.  Estimated Nutritional Needs:   Kcal:  1500-1700  Protein:  73-85  Fluid:  >1.5L  Lajuan Lines, RD, LDN  After Hours/Weekend Pager: [phone]

## 2019-05-06 NOTE — Progress Notes (Addendum)
PROGRESS NOTE  TORI CUPPS UXN:[id number] DOB: [birth year] DOA: 05/04/2019 PCP: [name], MD Brief History:  85 year old male with a history of carotid sinus hypersensitivity status post PPM, dementia, hypertension, hypothyroidism, GERD, BPH status post TURP presenting after syncopal episode at [facility].  The patient was getting ready for physical therapy when he lost consciousness for period of 30 to 45 seconds.  Unfortunately, additional history is not available secondary to patient's dementia.  The patient himself denies any dizziness, chest pain, shortness breath, nausea, vomiting, diarrhea, abdominal pain.  Notably, the patient recently had an EGD performed on 04/11/2019 which showed erosive gastropathy, Schatzki's ring which was dilated, and a large duodenal ulcer.  He was brought to the hospital for further evaluation secondary to syncope.  Work-up including a CT angiogram of the chest in the emergency department revealed a right lower lobe pulmonary embolus with no heart strain.  The patient was started on IV heparin.  Assessment/Plan: Acute Pulmonary Embolus -continue IV heparin -Echo-echo--EF 55-60%, indeterminant diastolic fxn -plan to switch to po AC 8/4 if stable  Left submandibular mass/Acute Parotitis -CT neck soft tissues--pronounced enlargement of L-parotid with soft tissue inflammation tracking inferiorly to left submandibular and parapharyngeal spaces -started merrem  Tachycardia -Personally reviewed telemetry -Patient has sinus tachycardia with frequent PACs -Likely due to the patient's pulmonary embolus -Check TSH 1.795  Leukocytosis -Obtain UA--showed pyuria, but culture not done -May be related to the patient's sialoadenitis as well as stress demargination  Recent right femur intratrochanteric fracture -Status post gamma nail 03/09/2019 -The patient was discharged to the [facility] for rehab and returned subsequently back to [facility] -Patient had  been receiving DVT prophylaxis postoperatively  Syncope -check orthostatics -monitor on telemetry -echo--EF 55-60%, indeterminant diastolic fxn -may be related to acute PE vs dysrhythmia  Essential hypertension -Continue carvedilol  Erosive gastropathy/duodenal ulcer -Continue Protonix  Dementia with behavioral disturbance -Patient is at risk for hospital delirium  Hypothyroidism -Continue Synthroid  CKD stage III -Baseline creatinine 0.8-1.1  Hypokalemia -repleted -mag 2.1     Disposition Plan:  [facility] in 1-2 days  Family Communication:   Son updated on phone 8/3  Consultants:  none  Code Status:  DNR  DVT Prophylaxis:  IV Heparin    Procedures: As Listed in Progress Note Above  Antibiotics: Merrem 8/2>>>    Subjective: Complains left jaw pain but swelling and tenderness a bit better.  No dysphagia or sob.  Patient denies fevers, chills, headache, chest pain, dyspnea, nausea, vomiting, diarrhea, abdominal pain, dysuria, hematuria, hematochezia, and melena.   Objective: Vitals:   05/05/19 2141 05/05/19 2152 05/06/19 0559 05/06/19 1500  BP: (!) 87/53 (!) 114/57 120/67 (!) 90/50  Pulse: 75 69 75 77  Resp: 19  19   Temp: 99 F (37.2 C)  97.9 F (36.6 C) 98.2 F (36.8 C)  TempSrc: Oral  Oral Axillary  SpO2: 96%  99% 99%    Intake/Output Summary (Last 24 hours) at 05/06/2019 1710 Last data filed at 05/06/2019 0300 Gross per 24 hour  Intake 1378.33 ml  Output --  Net 1378.33 ml   Weight change:  Exam:   General:  Pt is alert, follows commands appropriately, not in acute distress  HEENT: No icterus, No thrush, No neck mass, [place]/AT  Cardiovascular: RRR, S1/S2, no rubs, no gallops  Respiratory: CTA bilaterally, no wheezing, no crackles, no rhonchi  Abdomen: Soft/+BS, non tender, non distended, no guarding  Extremities: No edema, No  lymphangitis, No petechiae, No rashes, no synovitis   Data Reviewed: I have personally  reviewed following labs and imaging studies Basic Metabolic Panel: Recent Labs  Lab 05/04/19 1236 05/05/19 0022 05/06/19 0409  NA 140 141 143  K 5.1 4.0 3.3*  CL 107 109 112*  CO2 21* 21* 21*  GLUCOSE 167* 152* 111*  BUN 23 21 21   CREATININE 1.21 1.10 1.02  CALCIUM 8.5* 8.4* 7.8*  MG 1.7 1.7 2.1   Liver Function Tests: Recent Labs  Lab 05/04/19 1236  AST 43*  ALT 26  ALKPHOS 208*  BILITOT 1.5*  PROT 6.8  ALBUMIN 3.3*   No results for input(s): LIPASE, AMYLASE in the last 168 hours. No results for input(s): AMMONIA in the last 168 hours. Coagulation Profile: Recent Labs  Lab 05/04/19 1236  INR 1.3*   CBC: Recent Labs  Lab 05/04/19 1236 05/05/19 0022 05/06/19 0409  WBC 17.1* 19.3* 13.7*  HGB 16.1 15.4 13.2  HCT 49.4 47.6 40.5  MCV 103.1* 103.5* 102.0*  PLT 158 156 140*   Cardiac Enzymes: No results for input(s): CKTOTAL, CKMB, CKMBINDEX, TROPONINI in the last 168 hours. BNP: Invalid input(s): POCBNP CBG: No results for input(s): GLUCAP in the last 168 hours. HbA1C: No results for input(s): HGBA1C in the last 72 hours. Urine analysis:    Component Value Date/Time   COLORURINE YELLOW 05/04/2019 0308   APPEARANCEUR HAZY (A) 05/04/2019 0308   LABSPEC 1.042 (H) 05/04/2019 0308   PHURINE 5.0 05/04/2019 0308   GLUCOSEU NEGATIVE 05/04/2019 0308   HGBUR SMALL (A) 05/04/2019 0308   BILIRUBINUR NEGATIVE 05/04/2019 0308   KETONESUR 20 (A) 05/04/2019 0308   PROTEINUR NEGATIVE 05/04/2019 0308   NITRITE POSITIVE (A) 05/04/2019 0308   LEUKOCYTESUR LARGE (A) 05/04/2019 0308   Sepsis Labs: @LABRCNTIP (procalcitonin:4,lacticidven:4) ) Recent Results (from the past 240 hour(s))  SARS Coronavirus 2 [facility] order, Performed in [place] hospital lab)     Status: None   Collection Time: 05/04/19  3:35 PM  Result Value Ref Range Status   SARS Coronavirus 2 NEGATIVE NEGATIVE Final    Comment: (NOTE) If result is NEGATIVE SARS-CoV-2 target nucleic acids are NOT  DETECTED. The SARS-CoV-2 RNA is generally detectable in upper and lower  respiratory specimens during the acute phase of infection. The lowest  concentration of SARS-CoV-2 viral copies this assay can detect is 250  copies / mL. A negative result does not preclude SARS-CoV-2 infection  and should not be used as the sole basis for treatment or other  patient management decisions.  A negative result may occur with  improper specimen collection / handling, submission of specimen other  than nasopharyngeal swab, presence of viral mutation(s) within the  areas targeted by this assay, and inadequate number of viral copies  (<250 copies / mL). A negative result must be combined with clinical  observations, patient history, and epidemiological information. If result is POSITIVE SARS-CoV-2 target nucleic acids are DETECTED. The SARS-CoV-2 RNA is generally detectable in upper and lower  respiratory specimens dur ing the acute phase of infection.  Positive  results are indicative of active infection with SARS-CoV-2.  Clinical  correlation with patient history and other diagnostic information is  necessary to determine patient infection status.  Positive results do  not rule out bacterial infection or co-infection with other viruses. If result is PRESUMPTIVE POSTIVE SARS-CoV-2 nucleic acids MAY BE PRESENT.   A presumptive positive result was obtained on the submitted specimen  and confirmed on repeat testing.  While 2019 novel coronavirus  (SARS-CoV-2) nucleic acids may be present in the submitted sample  additional confirmatory testing may be necessary for epidemiological  and / or clinical management purposes  to differentiate between  SARS-CoV-2 and other Sarbecovirus currently known to infect humans.  If clinically indicated additional testing with an alternate test  methodology [phone]) is advised. The SARS-CoV-2 RNA is generally  detectable in upper and lower respiratory sp ecimens during  the acute  phase of infection. The expected result is Negative. Fact Sheet for Patients:  StrictlyIdeas.no Fact Sheet for Healthcare Providers: BankingDealers.co.za This test is not yet approved or cleared by the [place] FDA and has been authorized for detection and/or diagnosis of SARS-CoV-2 by FDA under an Emergency Use Authorization (EUA).  This EUA will remain in effect (meaning this test can be used) for the duration of the COVID-19 declaration under Section 564(b)(1) of the Act, 21 U.S.C. section 360bbb-3(b)(1), unless the authorization is terminated or revoked sooner. Performed at [facility], [address] [place], [place] [address]   MRSA PCR Screening     Status: Abnormal   Collection Time: 05/04/19 10:58 PM   Specimen: Nasal Mucosa; Nasopharyngeal  Result Value Ref Range Status   MRSA by PCR POSITIVE (A) NEGATIVE Final    Comment:        The GeneXpert MRSA Assay (FDA approved for NASAL specimens only), is one component of a comprehensive MRSA colonization surveillance program. It is not intended to diagnose MRSA infection nor to guide or monitor treatment for MRSA infections. RESULT CALLED TO, READ BACK BY AND VERIFIED WITH: TATE,R.  ON 05/05/2019 AT 0554 BY EVA Performed at [facility], [address] [place], [place] [address]      Scheduled Meds:  calcium carbonate  1 tablet Oral BID   carvedilol  3.125 mg Oral BID WC   Chlorhexidine Gluconate Cloth  6 each Topical Q0600   feeding supplement (ENSURE ENLIVE)  1 Bottle Oral Daily   feeding supplement (PRO-STAT SUGAR FREE 64)  30 mL Oral BID BM   levothyroxine  75 mcg Oral Q0600   mirtazapine  7.5 mg Oral QHS   mupirocin ointment  1 application Nasal BID   pantoprazole  40 mg Oral BID   Continuous Infusions:  heparin 800 Units/hr (05/06/19 0908)   meropenem (MERREM) IV 1 g (05/06/19 1218)    Procedures/Studies: Ct Head Wo  Contrast  Result Date: 05/04/2019 CLINICAL DATA:  Syncope with questionable fall EXAM: CT HEAD WITHOUT CONTRAST TECHNIQUE: Contiguous axial images were obtained from the base of the skull through the vertex without intravenous contrast. COMPARISON:  November 24, 2004 FINDINGS: Brain: There is mild to moderate diffuse atrophy. There is a small cavum septum pellucidum, an anatomic variant. There is no intracranial mass, hemorrhage, extra-axial fluid collection, or midline shift. There is patchy small vessel disease in the centra semiovale bilaterally. There is small vessel disease in each thalamus. No acute infarct is evident. Vascular: There is no hyperdense vessel. There is calcification in each carotid siphon region. Skull: Bony calvarium appears intact. Sinuses/Orbits: There is opacification in the posterior aspect of each sphenoid sinus. There is mucosal thickening in several ethmoid air cells. There is a retention cyst in the right frontal sinus. Visualized orbits appear symmetric bilaterally. Other: Visualized mastoid air cells are clear. IMPRESSION: Mild-to-moderate atrophy with periventricular small vessel disease. Small vessel disease noted in each thalamus. No acute infarct. No mass or hemorrhage. There are foci of arterial vascular calcification. There  are foci of paranasal sinus disease at several sites. Electronically Signed   By: Lowella Grip III M.D.   On: 05/04/2019 14:33   Ct Soft Tissue Neck Wo Contrast  Result Date: 05/05/2019 CLINICAL DATA:  85 year old male woke with left side facial swelling and redness. Remote history of vocal cord cancer. Small acute right lung PE diagnosed by CTA yesterday. EXAM: CT NECK WITHOUT CONTRAST TECHNIQUE: Multidetector CT imaging of the neck was performed following the standard protocol without intravenous contrast. COMPARISON:  CTA chest 05/04/2019.  Head CT 05/04/2019. FINDINGS: Pharynx and larynx: Laryngeal soft tissue contours remain normal. There is  widespread left parapharyngeal soft tissue stranding, and asymmetric blunting of the left lateral pharynx. However, no discrete pharyngeal mass. The retropharyngeal space remains within normal limits. The right parapharyngeal space is normal. Salivary glands: Negative noncontrast sublingual space. Both submandibular glands are diminutive. There is left submandibular space inflammatory stranding which may be involving or just obscuring the left submandibular gland. The right gland appears within normal limits. The right parotid gland is diminutive, normal. The left parotid gland is enlarged, and there is regional superficial soft tissue inflammation. A palpable skin marker was placed just posterior and inferior to the left parotid gland. No parotid duct dilatation is evident. No sialolithiasis. No discrete parotid mass in the absence of contrast. The left parotid was not included on the head CT yesterday. Thyroid: Highly diminutive. Lymph nodes: Soft tissue stranding tracking from the left parotid space through the left submandibular space and to the left parapharyngeal space with no superimposed left neck lymphadenopathy evident in the absence of contrast. Right neck lymph nodes also appear diminutive, normal. Vascular: Vascular patency is not evaluated in the absence of IV contrast. Left subclavian approach cardiac pacemaker leads. Mild for age carotid bifurcation calcified plaque. Calcified atherosclerosis at the skull base. Limited intracranial: Stable visible brain parenchyma. Visualized orbits: Negative. Mastoids and visualized paranasal sinuses: Small left sphenoid sinus fluid level is stable. Other Visualized paranasal sinuses and mastoids are stable and well pneumatized. Skeleton: Carious left posterior mandible molar, although this does not appear to be the epicenter of the soft tissue inflammation. Advanced degenerative changes in the cervical spine. Osteopenia. No acute osseous abnormality identified.  Upper chest: Visible upper lungs remain clear. Stable and negative visible upper mediastinum. Calcified aortic atherosclerosis. Partially visible left chest pacemaker type device. IMPRESSION: 1. Pronounced asymmetric enlargement of the left parotid gland, which was not visible on the head CT yesterday. Soft tissue inflammation surrounding the left parotid and tracking inferiorly in the left submandibular and parapharyngeal spaces. No sialolithiasis or left parotid duct enlargement identified. Favor Acute Infectious Parotitis, with secondary inflammation of the left lateral pharynx and possibly the left submandibular gland. 2. No cervical lymphadenopathy. Stable and negative visible upper chest. Electronically Signed   By: Genevie Ann M.D.   On: 05/05/2019 10:15   Ct Angio Chest Pe W Or Wo Contrast  Result Date: 05/04/2019 CLINICAL DATA:  Syncope and tachycardia EXAM: CT ANGIOGRAPHY CHEST WITH CONTRAST TECHNIQUE: Multidetector CT imaging of the chest was performed using the standard protocol during bolus administration of intravenous contrast. Multiplanar CT image reconstructions and MIPs were obtained to evaluate the vascular anatomy. CONTRAST:  145mL OMNIPAQUE IOHEXOL 350 MG/ML SOLN COMPARISON:  Chest radiograph May 04, 2019. FINDINGS: Cardiovascular: There is a focal small pulmonary embolus in the right lower lobe at the proximal posterior segment right lower lobe pulmonary artery branch. No more central pulmonary emboli are evident. There is no appreciable  right heart strain; right ventricle to left ventricle diameter ratio is less than 0.9. There is aortic atherosclerosis. There is no thoracic aortic aneurysm or dissection. Visualized great vessels appear unremarkable. There is no pericardial effusion or pericardial thickening. Pacemaker present with leads attached to right atrium and right ventricle. There are scattered foci of coronary artery calcification. Mediastinum/Nodes: Thyroid is diminutive. No thyroid  lesions evident. There is no demonstrable thoracic adenopathy. No esophageal lesions are evident. Lungs/Pleura: There is atelectatic change in the lung bases. There is no edema or consolidation. No pleural effusions are evident. Upper Abdomen: There is cholelithiasis. Gallbladder appears somewhat distended. There is aortic atherosclerosis as well as foci of calcification in visualized great vessels. Musculoskeletal: There is extensive thoracic arthropathy. There is bony hypertrophy at several levels in the lower thoracic spine with varying degrees of the spinal stenosis. Moderate spinal stenosis is noted at the L2-3 level. No blastic or lytic bone lesions are evident. Pacemaker device is noted superficially on the left anteriorly. Review of the MIP images confirms the above findings. IMPRESSION: 1. A single pulmonary embolus is noted in the proximal right lower lobe posterior segmental branch. No more central pulmonary embolus evident. No right heart strain. 2. No thoracic aortic aneurysm or dissection evident. There is aortic atherosclerosis. There are scattered foci of coronary artery calcification. Pacemaker leads attached to right atrium and right ventricle. 3. Posterior atelectatic change bilaterally. No lung edema or consolidation. No evident pleural effusion. 4.  No evident adenopathy. 5.  Cholelithiasis.  Gallbladder appears somewhat distended. 6. Extensive arthropathy in the thoracic spine and upper lumbar spine. Spinal stenosis in lower thoracic and upper lumbar regions, most notably at L2-3. Critical Value/emergent results were called by telephone at the time of interpretation on 05/04/2019 at 2:30 pm to Dr. Gerlene Fee , who verbally acknowledged these results. Electronically Signed   By: Lowella Grip III M.D.   On: 05/04/2019 14:30   Dg Chest Port 1 View  Result Date: 05/04/2019 CLINICAL DATA:  unresponsive for 30-45 sec. Pt says he doesn't remember passing out. Pt says he didn't sleep well last  night and hasn't been eating their food. Reports had recently been at [facility] for rehab for hip fracture and was moved to [place] on 7/23. Pt alert and oriented x 4. HISTORY OF CANCER, HTN, DEMENTIA, GERD EXAM: PORTABLE CHEST - 1 VIEW COMPARISON:  03/09/2019 FINDINGS: Lungs are clear. Heart size and mediastinal contours are within normal limits. Aortic Atherosclerosis (ICD10-170.0). Stable left subclavian dual lead transvenous pacemaker. No effusion. Advanced DJD in bilateral shoulders. IMPRESSION: No acute cardiopulmonary disease. Electronically Signed   By: Lucrezia Europe M.D.   On: 05/04/2019 13:05   Dg Hip Unilat With Pelvis 2-3 Views Right  Result Date: 04/10/2019 [facility] Postop fracture intratrochanteric status post internal fixation x-ray compared to June 6 Right intertrochanteric fracture pelvis film AP lateral right hip Standard gamma nail distal locking screws lag screw.  Tip to apex today's distance looks good and maintained from surgery fracture looks reduced No complications impression stable fracture fixation right hip    Jye Fariss, DO  [facility] Pager [phone]  If 7PM-7AM, please contact night-coverage [email or web address] Password [email or web address] 05/06/2019, 5:10 PM   LOS: 2 days

## 2019-05-06 NOTE — Plan of Care (Signed)
  Problem: Acute Rehab PT Goals(only PT should resolve) Goal: Pt Will Go Supine/Side To Sit Outcome: Progressing Flowsheets (Taken 05/06/2019 1415) Pt will go Supine/Side to Sit: with supervision Goal: Patient Will Transfer Sit To/From Stand Outcome: Progressing Flowsheets (Taken 05/06/2019 1415) Patient will transfer sit to/from stand: with supervision Goal: Pt Will Transfer Bed To Chair/Chair To Bed Outcome: Progressing Flowsheets (Taken 05/06/2019 1415) Pt will Transfer Bed to Chair/Chair to Bed:  with supervision  min guard assist Goal: Pt Will Ambulate Outcome: Progressing Flowsheets (Taken 05/06/2019 1415) Pt will Ambulate:  100 feet  with supervision  with rolling walker  with min guard assist   2:16 PM, 05/06/19 Lonell Grandchild, MPT Physical Therapist with [facility] [phone] office [phone] mobile phone

## 2019-05-06 NOTE — Evaluation (Signed)
Physical Therapy Evaluation Patient Details Name: Phillip Taylor MRN: [id number] DOB: [birth year] Today's Date: 05/06/2019   History of Present Illness  Phillip Taylor is a 85 y.o. male with a history of Carotid stenosis, dementia, femur fracture, GERD, HTN, hypothyroidism, osteoporosis, patient has pacemaker.  Patient has syncopal episode for approximately 30 to 45 seconds at [facility] while working with PT.  Patient has dementia and is unable to recall of the event.  Patient does not have any histories of syncopal episodes.  Patient was brought to the hospital for evaluation.    Clinical Impression  Patient functioning near baseline for functional mobility and gait, requires repeated verbal cueing possibly due to [facility], labored movement for sitting up at bedside, ambulated in room/hallway without loss of balance and tolerated sitting up in chair after therapy - nursing staff notified.  Patient will benefit from continued physical therapy in hospital and recommended venue below to increase strength, balance, endurance for safe ADLs and gait.     Follow Up Recommendations Home health PT;Supervision for mobility/OOB;Supervision - Intermittent    Equipment Recommendations  None recommended by PT    Recommendations for Other Services       Precautions / Restrictions Precautions Precautions: Fall Restrictions Weight Bearing Restrictions: No      Mobility  Bed Mobility Overal bed mobility: Needs Assistance Bed Mobility: Supine to Sit     Supine to sit: Min assist     General bed mobility comments: labored movement  Transfers Overall transfer level: Needs assistance Equipment used: Rolling walker (2 wheeled) Transfers: Sit to/from [facility] Sit to Stand: Min assist;Min guard Stand pivot transfers: Min assist;Min guard       General transfer comment: increased time, slightly labored  Ambulation/Gait Ambulation/Gait assistance: Min guard Gait Distance (Feet): 65  Feet Assistive device: Rolling walker (2 wheeled) Gait Pattern/deviations: Decreased step length - right;Decreased step length - left;Decreased stride length Gait velocity: decreased   General Gait Details: slightly labored cadence without loss of balance, limited secondary to fatigue  Stairs            Wheelchair Mobility    Modified Rankin (Stroke Patients Only)       Balance Overall balance assessment: Needs assistance Sitting-balance support: No upper extremity supported;Feet supported Sitting balance-Leahy Scale: Fair Sitting balance - Comments: fair/good seated at bedside   Standing balance support: During functional activity;Bilateral upper extremity supported Standing balance-Leahy Scale: Fair Standing balance comment: using RW                             Pertinent Vitals/Pain Pain Assessment: No/denies pain    Home Living Family/patient expects to be discharged to:: Assisted living               Home Equipment: Walker - 2 wheels;Wheelchair - manual      Prior Function Level of Independence: Needs assistance   Gait / Transfers Assistance Needed: Supervised household ambulator  ADL's / Homemaking Assistance Needed: assisted by ALF staff        Hand Dominance   Dominant Hand: Right    Extremity/Trunk Assessment   Upper Extremity Assessment Upper Extremity Assessment: Generalized weakness    Lower Extremity Assessment Lower Extremity Assessment: Generalized weakness    Cervical / Trunk Assessment Cervical / Trunk Assessment: Normal  Communication   Communication: HOH  Cognition Arousal/Alertness: Awake/alert Behavior During Therapy: WFL for tasks assessed/performed Overall Cognitive Status: History of cognitive impairments -  at baseline                                        General Comments      Exercises     Assessment/Plan    PT Assessment Patient needs continued PT services  PT Problem List  Decreased strength;Decreased activity tolerance;Decreased balance;Decreased mobility       PT Treatment Interventions Gait training;Stair training;Functional mobility training;Therapeutic activities;Therapeutic exercise;Patient/family education    PT Goals (Current goals can be found in the Care Plan section)  Acute Rehab PT Goals Patient Stated Goal: return home PT Goal Formulation: With patient Time For Goal Achievement: 05/09/19 Potential to Achieve Goals: Good    Frequency Min 3X/week   Barriers to discharge        Co-evaluation               AM-PAC PT "6 Clicks" Mobility  Outcome Measure Help needed turning from your back to your side while in a flat bed without using bedrails?: A Little Help needed moving from lying on your back to sitting on the side of a flat bed without using bedrails?: A Little Help needed moving to and from a bed to a chair (including a wheelchair)?: A Little Help needed standing up from a chair using your arms (e.g., wheelchair or bedside chair)?: A Little Help needed to walk in hospital room?: A Little Help needed climbing 3-5 steps with a railing? : A Lot 6 Click Score: 17    End of Session   Activity Tolerance: Patient tolerated treatment well;Patient limited by fatigue Patient left: in chair;with call bell/phone within reach;with chair alarm set Nurse Communication: Mobility status PT Visit Diagnosis: Other abnormalities of gait and mobility (R26.89);Unsteadiness on feet (R26.81);Muscle weakness (generalized) (M62.81)    Time: 7619-5093 PT Time Calculation (min) (ACUTE ONLY): 30 min   Charges:   PT Evaluation $PT Eval Moderate Complexity: 1 Mod PT Treatments $Therapeutic Activity: 23-37 mins        2:14 PM, 05/06/19 Lonell Grandchild, MPT Physical Therapist with [facility] [phone] office [phone] mobile phone

## 2019-05-06 NOTE — TOC Initial Note (Signed)
Transition of Care [facility]) - Initial/Assessment Note    Patient Details  Name: Phillip Taylor MRN: [id number] Date of Birth: [birth year]  Transition of Care [facility]) CM/SW Contact:    Xyon Lukasik, Chauncey Reading, RN Phone Number: 05/06/2019, 1:49 PM  Clinical Narrative:  PE.   Patient is a resident of [facility], transitioned from [facility] on 04/25/19.  Discussed with [name] of [place], plan will be for return to ALF at time of DC. Patient is at supervision level care of all ADL's.   Will need FL2. Currently documented to be on room air.           He is active with [facility] for PT and OT. Will need resumption orders.    Expected Discharge Plan: Assisted Living Barriers to Discharge: No Barriers Identified   Expected Discharge Plan and Services Expected Discharge Plan: Assisted Living     Post Acute Care Choice: Home Health, Resumption of Svcs/PTA Provider Living arrangements for the past 2 months: [place]                  Prior Living Arrangements/Services Living arrangements for the past 2 months: [place]     Activities of Daily Living Home Assistive Devices/Equipment: Other (Comment)(Patient from SNF) ADL Screening (condition at time of admission) Patient's cognitive ability adequate to safely complete daily activities?: No Is the patient deaf or have difficulty hearing?: Yes Does the patient have difficulty seeing, even when wearing glasses/contacts?: No Does the patient have difficulty concentrating, remembering, or making decisions?: Yes Patient able to express need for assistance with ADLs?: Yes Does the patient have difficulty dressing or bathing?: Yes Independently performs ADLs?: No Communication: Independent Dressing (OT): Needs assistance Is this a change from baseline?: Pre-admission baseline Grooming: Needs assistance Is this a change from baseline?: Pre-admission baseline Feeding: Independent Bathing: Needs  assistance Is this a change from baseline?: Pre-admission baseline Toileting: Needs assistance Is this a change from baseline?: Pre-admission baseline In/Out Bed: Needs assistance Is this a change from baseline?: Pre-admission baseline Walks in Home: Needs assistance Is this a change from baseline?: Pre-admission baseline Does the patient have difficulty walking or climbing stairs?: Yes Weakness of Legs: Both Weakness of Arms/Hands: Both     Admission diagnosis:  Syncope [R55] Atrial fibrillation, unspecified type ([place]) [I48.91] Acute pulmonary embolism without acute cor pulmonale, unspecified pulmonary embolism type ([place]) [I26.99] Patient Active Problem List   Diagnosis Date Noted  . CKD (chronic kidney disease) stage 3, GFR 30-59 ml/min (HCC) 05/05/2019  . Pulmonary embolism ([place]) 05/04/2019  . Atrial fibrillation ([place]) 05/04/2019  . GERD without esophagitis 04/17/2019  . Poor appetite 04/09/2019  . Dysphagia 04/09/2019  . Weight loss 04/07/2019  . Protein-calorie malnutrition, severe ([place]) 03/30/2019  . Elevated liver enzymes 03/29/2019  . Vaso vagal episode 03/29/2019  . Hypokalemia 03/20/2019  . Chronic constipation 03/12/2019  . Carotid artery stenosis 03/12/2019  . Carotid sinus hypersensitivity 03/12/2019  . Hypocalcemia 03/12/2019  . Acute blood loss anemia 03/12/2019  . Dementia without behavioral disturbance ([place]) 03/12/2019  . Intertrochanteric fracture of right femur, closed, initial encounter ([place]) 03/09/2019  . Acute dehydration 03/09/2019  . Osteoporosis 03/09/2019  . Essential hypertension 03/09/2019  . Hypothyroidism 03/09/2019  . Intertrochanteric fracture ([place]) 03/09/2019  . Heme positive stool 10/17/2012   PCP:  [name], MD Pharmacy:   [facility], [place] [address] [place] [address]  Phone: [phone] Fax: [phone]  [facility] -  [address] [place], [place] [address] [place] [address] Phone: [phone] Fax: [phone]     Social Determinants of Health (SDOH) Interventions    Readmission Risk Interventions Readmission Risk Prevention Plan 05/06/2019 03/11/2019  Post Dischage Appt - Not Complete  Appt Comments - Pt going SNF rehab. SNF MD will follow.  Medication Screening - Complete  Transportation Screening Complete Complete  PCP or Specialist Appt within 3-5 Days Complete -  HRI or Home Care Consult Complete -  Social Work Consult for Recovery Care Planning/Counseling Complete -  Palliative Care Screening Complete -  Medication Review Press photographer) Complete -  Some recent data might be hidden

## 2019-05-06 NOTE — Care Management Important Message (Signed)
Important Message  Patient Details  Name: Phillip Taylor MRN: [id number] Date of Birth: 1925-09-04   Medicare Important Message Given:  Yes     [name] 05/06/2019, 2:43 PM

## 2019-05-06 NOTE — Progress Notes (Signed)
Patients potassium level this am was 3.3. magnesium level was 2.1. paged MD. No new orders at this time. Will continue to monitor throughout shift.

## 2019-05-06 NOTE — Progress Notes (Signed)
ANTICOAGULATION CONSULT NOTE -   Pharmacy Consult for heparin Indication: pulmonary embolus  Allergies  Allergen Reactions  . Prevacid [Lansoprazole] Other (See Comments)    Does not remeber  . Prilosec [Omeprazole] Other (See Comments)    Does not remeber  . Codeine Other (See Comments)    Does not know   . Penicillins Rash    .Did it involve swelling of the face/tongue/throat, SOB, or low BP? No Did it involve sudden or severe rash/hives, skin peeling, or any reaction on the inside of your mouth or nose? Yes Did you need to seek medical attention at a hospital or doctor's office? Unknown When did it last happen?Unknown If all above answers are "NO", may proceed with cephalosporin use.      Patient Measurements:   Heparin Dosing Weight:   Vital Signs: Temp: 98.2 F (36.8 C) (08/03 1500) Temp Source: Axillary (08/03 1500) BP: 90/50 (08/03 1500) Pulse Rate: 77 (08/03 1500)  Labs: Recent Labs    05/04/19 1236 05/04/19 1432 05/05/19 0022  05/05/19 1929 05/06/19 0409 05/06/19 0549 05/06/19 1610  HGB 16.1  --  15.4  --   --  13.2  --   --   HCT 49.4  --  47.6  --   --  40.5  --   --   PLT 158  --  156  --   --  140*  --   --   LABPROT 16.0*  --   --   --   --   --   --   --   INR 1.3*  --   --   --   --   --   --   --   HEPARINUNFRC  --   --  0.81*   < > 0.33  --  0.24* 0.40  CREATININE 1.21  --  1.10  --   --  1.02  --   --   TROPONINIHS 17 16  --   --   --   --   --   --    < > = values in this interval not displayed.    Estimated Creatinine Clearance: 36.8 mL/min (by C-G formula based on SCr of 1.02 mg/dL).   Medical History: Past Medical History:  Diagnosis Date  . BPH (benign prostatic hyperplasia)   . Carotid sinus hypersensitivity   . Carotid stenosis, asymptomatic, bilateral   . Dementia ([place])   . Femur fracture ([place])   . GERD (gastroesophageal reflux disease)   . Hypertension   . Hypothyroidism   . Osteoporosis   . PONV (postoperative  nausea and vomiting)   . Vocal cord cancer ([place]) 1995   cancer of the larynx s/p resection and XRT    Medications:  Medications Prior to Admission  Medication Sig Dispense Refill Last Dose  . Amino Acids-Protein Hydrolys (FEEDING SUPPLEMENT, PRO-STAT SUGAR FREE 64,) LIQD Take 30 mLs by mouth 2 (two) times daily between meals.   05/04/2019 at 0700  . calcium carbonate (TUMS EX) 750 MG chewable tablet Chew 1 tablet by mouth 2 (two) times daily.   05/04/2019 at 0700  . carvedilol (COREG) 3.125 MG tablet Take 3.125 mg by mouth 2 (two) times daily with a meal.   05/04/2019 at 0700  . levothyroxine (SYNTHROID) 75 MCG tablet Take 75 mcg by mouth daily before breakfast.   05/04/2019 at 0600  . mirtazapine (REMERON) 7.5 MG tablet Take 7.5 mg by mouth at bedtime.   05/03/2019 at Unknown  time  . NON FORMULARY Diet Type: REGULAR     . Nutritional Supplements (ENSURE ENLIVE PO) Take 1 Bottle by mouth daily.   05/03/2019 at Unknown time  . pantoprazole (PROTONIX) 40 MG tablet Take 40 mg by mouth 2 (two) times daily.   05/04/2019 at 0700  . potassium chloride SA (K-DUR) 20 MEQ tablet Take 20 mEq by mouth daily.   05/04/2019 at 0700  . vitamin B-12 (CYANOCOBALAMIN) 1000 MCG tablet Take 1,000 mcg by mouth daily.   05/04/2019 at 0700    Assessment: Pharmacy consulted to dose heparin in patient with pulmonary embolism.  Patient is not on anticoagulants prior to admission  Initial heparin level 0.40.  Goal of Therapy:  Heparin level 0.3-0.7 units/ml Monitor platelets by anticoagulation protocol: Yes   Plan:  Continue heparin infusion at 800 units/hr Check anti-Xa level  daily while on heparin Continue to monitor H&H and platelets  Margot Ables, PharmD Clinical Pharmacist 05/06/2019 4:58 PM

## 2019-05-06 NOTE — Progress Notes (Signed)
ANTICOAGULATION CONSULT NOTE -   Pharmacy Consult for heparin Indication: pulmonary embolus  Allergies  Allergen Reactions  . Prevacid [Lansoprazole] Other (See Comments)    Does not remeber  . Prilosec [Omeprazole] Other (See Comments)    Does not remeber  . Codeine Other (See Comments)    Does not know   . Penicillins Rash    .Did it involve swelling of the face/tongue/throat, SOB, or low BP? No Did it involve sudden or severe rash/hives, skin peeling, or any reaction on the inside of your mouth or nose? Yes Did you need to seek medical attention at a hospital or doctor's office? Unknown When did it last happen?Unknown If all above answers are "NO", may proceed with cephalosporin use.      Patient Measurements:   Heparin Dosing Weight:   Vital Signs: Temp: 97.9 F (36.6 C) (08/03 0559) Temp Source: Oral (08/03 0559) BP: 120/67 (08/03 0559) Pulse Rate: 75 (08/03 0559)  Labs: Recent Labs    05/04/19 1236 05/04/19 1432  05/05/19 0022 05/05/19 0940 05/05/19 1929 05/06/19 0409 05/06/19 0549  HGB 16.1  --   --  15.4  --   --  13.2  --   HCT 49.4  --   --  47.6  --   --  40.5  --   PLT 158  --   --  156  --   --  140*  --   LABPROT 16.0*  --   --   --   --   --   --   --   INR 1.3*  --   --   --   --   --   --   --   HEPARINUNFRC  --   --    < > 0.81* 0.75* 0.33  --  0.24*  CREATININE 1.21  --   --  1.10  --   --  1.02  --   TROPONINIHS 17 16  --   --   --   --   --   --    < > = values in this interval not displayed.    Estimated Creatinine Clearance: 36.8 mL/min (by C-G formula based on SCr of 1.02 mg/dL).   Medical History: Past Medical History:  Diagnosis Date  . BPH (benign prostatic hyperplasia)   . Carotid sinus hypersensitivity   . Carotid stenosis, asymptomatic, bilateral   . Dementia ([place])   . Femur fracture ([place])   . GERD (gastroesophageal reflux disease)   . Hypertension   . Hypothyroidism   . Osteoporosis   . PONV (postoperative  nausea and vomiting)   . 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 Amino Acids-Protein Hydrolys (FEEDING SUPPLEMENT, PRO-STAT SUGAR FREE 64,) LIQD Take 30 mLs by mouth 2 (two) times daily between meals.   05/04/2019 at 0700  . calcium carbonate (TUMS EX) 750 MG chewable tablet Chew 1 tablet by mouth 2 (two) times daily.   05/04/2019 at 0700  . carvedilol (COREG) 3.125 MG tablet Take 3.125 mg by mouth 2 (two) times daily with a meal.   05/04/2019 at 0700  . levothyroxine (SYNTHROID) 75 MCG tablet Take 75 mcg by mouth daily before breakfast.   05/04/2019 at 0600  . mirtazapine (REMERON) 7.5 MG tablet Take 7.5 mg by mouth at bedtime.   05/03/2019 at Unknown  time  . NON FORMULARY Diet Type: REGULAR     . Nutritional Supplements (ENSURE ENLIVE PO) Take 1 Bottle by mouth daily.   05/03/2019 at Unknown time  . pantoprazole (PROTONIX) 40 MG tablet Take 40 mg by mouth 2 (two) times daily.   05/04/2019 at 0700  . potassium chloride SA (K-DUR) 20 MEQ tablet Take 20 mEq by mouth daily.   05/04/2019 at 0700  . vitamin B-12 (CYANOCOBALAMIN) 1000 MCG tablet Take 1,000 mcg by mouth daily.   05/04/2019 at 0700    Assessment: Pharmacy consulted to dose heparin in patient with pulmonary embolism.  Patient is not on anticoagulants prior to admission  Initial heparin level 0.24.  Goal of Therapy:  Heparin level 0.3-0.7 units/ml Monitor platelets by anticoagulation protocol: Yes   Plan:  Rebolus heparin 850 units x 1 Increase heparin infusion to 800 units/hr Check anti-Xa level in 8 hours and daily while on heparin Continue to monitor H&H and platelets  Margot Ables, PharmD Clinical Pharmacist 05/06/2019 7:55 AM

## 2019-05-07 LAB — BASIC METABOLIC PANEL
Anion gap: 7 (ref 5–15)
BUN: 21 mg/dL (ref 8–23)
CO2: 22 mmol/L (ref 22–32)
Calcium: 7.5 mg/dL — ABNORMAL LOW (ref 8.9–10.3)
Chloride: 115 mmol/L — ABNORMAL HIGH (ref 98–111)
Creatinine, Ser: 1 mg/dL (ref 0.61–1.24)
GFR calc Af Amer: >60 mL/min (ref >60)
GFR calc non Af Amer: >60 mL/min (ref >60)
Glucose, Bld: 103 mg/dL — ABNORMAL HIGH (ref 70–99)
Potassium: 3.2 mmol/L — ABNORMAL LOW (ref 3.5–5.1)
Sodium: 144 mmol/L (ref 135–145)

## 2019-05-07 LAB — CBC
HCT: 37.3 % — ABNORMAL LOW (ref 39.0–52.0)
Hemoglobin: 11.7 g/dL — ABNORMAL LOW (ref 13.0–17.0)
MCH: 33.2 pg (ref 26.0–34.0)
MCHC: 31.4 g/dL (ref 30.0–36.0)
MCV: 106 fL — ABNORMAL HIGH (ref 80.0–100.0)
Platelets: 128 10*3/uL — ABNORMAL LOW (ref 150–400)
RBC: 3.52 MIL/uL — ABNORMAL LOW (ref 4.22–5.81)
RDW: 14.8 % (ref 11.5–15.5)
WBC: 6.6 10*3/uL (ref 4.0–10.5)
nRBC: 0 % (ref 0.0–0.2)

## 2019-05-07 LAB — HEPARIN LEVEL (UNFRACTIONATED): Heparin Unfractionated: 0.35 IU/mL (ref 0.30–0.70)

## 2019-05-07 MED ORDER — POTASSIUM CHLORIDE CRYS ER 20 MEQ PO TBCR
40.0000 meq | EXTENDED_RELEASE_TABLET | Freq: Once | ORAL | Status: AC
  Administered 2019-05-07: 09:00:00 40 meq via ORAL
  Filled 2019-05-07: qty 2

## 2019-05-07 MED ORDER — APIXABAN 5 MG PO TABS
10.0000 mg | ORAL_TABLET | Freq: Two times a day (BID) | ORAL | Status: DC
  Administered 2019-05-07: 10 mg via ORAL
  Filled 2019-05-07: qty 2

## 2019-05-07 MED ORDER — LEVOFLOXACIN 500 MG PO TABS: 500.0000 mg | ORAL_TABLET | Freq: Every day | ORAL | Status: DC

## 2019-05-07 MED ORDER — CLINDAMYCIN HCL 300 MG PO CAPS: 300.0000 mg | ORAL_CAPSULE | Freq: Three times a day (TID) | ORAL | 0 refills | Status: DC

## 2019-05-07 MED ORDER — CLINDAMYCIN HCL 150 MG PO CAPS: 300.0000 mg | ORAL_CAPSULE | Freq: Three times a day (TID) | ORAL | Status: DC

## 2019-05-07 MED ORDER — APIXABAN 5 MG PO TABS: 5.0000 mg | ORAL_TABLET | Freq: Two times a day (BID) | ORAL | Status: DC

## 2019-05-07 MED ORDER — LEVOFLOXACIN 500 MG PO TABS: 500.0000 mg | ORAL_TABLET | Freq: Every day | ORAL | 0 refills | Status: DC

## 2019-05-07 MED ORDER — APIXABAN 5 MG PO TABS: 10.0000 mg | ORAL_TABLET | Freq: Two times a day (BID) | ORAL | 0 refills | Status: DC

## 2019-05-07 NOTE — Plan of Care (Signed)

## 2019-05-07 NOTE — Progress Notes (Signed)
ANTICOAGULATION CONSULT NOTE -   Pharmacy Consult for heparin--> eliquis Indication: pulmonary embolus  Allergies  Allergen Reactions  . Prevacid [Lansoprazole] Other (See Comments)    Does not remeber  . Prilosec [Omeprazole] Other (See Comments)    Does not remeber  . Codeine Other (See Comments)    Does not know   . Penicillins Rash    .Did it involve swelling of the face/tongue/throat, SOB, or low BP? No Did it involve sudden or severe rash/hives, skin peeling, or any reaction on the inside of your mouth or nose? Yes Did you need to seek medical attention at a hospital or doctor's office? Unknown When did it last happen?Unknown If all above answers are "NO", may proceed with cephalosporin use.      Patient Measurements: Weight: 130 lb 11.7 oz (59.3 kg)   Vital Signs: Temp: 98.3 F (36.8 C) (08/04 0915) Temp Source: Oral (08/04 0915) BP: 122/59 (08/04 0915) Pulse Rate: 75 (08/04 0915)  Labs: Recent Labs    05/04/19 1236 05/04/19 1432 05/05/19 0022  05/06/19 0409 05/06/19 0549 05/06/19 1610 05/07/19 0616  HGB 16.1  --  15.4  --  13.2  --   --  11.7*  HCT 49.4  --  47.6  --  40.5  --   --  37.3*  PLT 158  --  156  --  140*  --   --  128*  LABPROT 16.0*  --   --   --   --   --   --   --   INR 1.3*  --   --   --   --   --   --   --   HEPARINUNFRC  --   --  0.81*   < >  --  0.24* 0.40 0.35  CREATININE 1.21  --  1.10  --  1.02  --   --  1.00  TROPONINIHS 17 16  --   --   --   --   --   --    < > = values in this interval not displayed.    Estimated Creatinine Clearance: 39.5 mL/min (by C-G formula based on SCr of 1 mg/dL).   Medical History: Past Medical History:  Diagnosis Date  . BPH (benign prostatic hyperplasia)   . Carotid sinus hypersensitivity   . Carotid stenosis, asymptomatic, bilateral   . Dementia ([place])   . Femur fracture ([place])   . GERD (gastroesophageal reflux disease)   . Hypertension   . Hypothyroidism   . Osteoporosis   . PONV  (postoperative nausea and vomiting)   . 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 Amino Acids-Protein Hydrolys (FEEDING SUPPLEMENT, PRO-STAT SUGAR FREE 64,) LIQD Take 30 mLs by mouth 2 (two) times daily between meals.   05/04/2019 at 0700  . calcium carbonate (TUMS EX) 750 MG chewable tablet Chew 1 tablet by mouth 2 (two) times daily.   05/04/2019 at 0700  . carvedilol (COREG) 3.125 MG tablet Take 3.125 mg by mouth 2 (two) times daily with a meal.   05/04/2019 at 0700  . levothyroxine (SYNTHROID) 75 MCG tablet Take 75 mcg by mouth daily before breakfast.   05/04/2019 at 0600  . mirtazapine (REMERON) 7.5 MG tablet Take 7.5 mg by mouth at bedtime.   05/03/2019 at Unknown time  . NON FORMULARY  Diet Type: REGULAR     . Nutritional Supplements (ENSURE ENLIVE PO) Take 1 Bottle by mouth daily.   05/03/2019 at Unknown time  . pantoprazole (PROTONIX) 40 MG tablet Take 40 mg by mouth 2 (two) times daily.   05/04/2019 at 0700  . potassium chloride SA (K-DUR) 20 MEQ tablet Take 20 mEq by mouth daily.   05/04/2019 at 0700  . vitamin B-12 (CYANOCOBALAMIN) 1000 MCG tablet Take 1,000 mcg by mouth daily.   05/04/2019 at 0700    Assessment: Pharmacy consulted to dose heparin in patient with pulmonary embolism.  Patient is not on anticoagulants prior to admission. Heparin level remains therapeutic.  Patient now ready for transition to po eliquis per MD. (Transition patient to [facility].)  Goal of Therapy:  Monitor platelets by anticoagulation protocol: Yes   Plan:  D/c heparin eliquis 10mg  po bid x 7 days, then 5mg  po bid Continue to monitor H&H and platelets  Isac Sarna, BS Vena [place], BCPS Clinical Pharmacist Pager [phone] 05/07/2019 10:28 AM

## 2019-05-07 NOTE — Progress Notes (Signed)
Physical Therapy Treatment Patient Details Name: Phillip Taylor MRN: [id number] DOB: 20-Apr-1925 Today's Date: 05/07/2019    History of Present Illness Phillip Taylor is a 85 y.o. male with a history of Carotid stenosis, dementia, femur fracture, GERD, HTN, hypothyroidism, osteoporosis, patient has pacemaker.  Patient has syncopal episode for approximately 30 to 45 seconds at [facility] while working with PT.  Patient has dementia and is unable to recall of the event.  Patient does not have any histories of syncopal episodes.  Patient was brought to the hospital for evaluation.    PT Comments    Pt presents supine in bed, reports being tired and doesn't want to get out of bed, but agreeable to therapy. PT educated pt on propping on elbows to assist in uprighting trunk for supine to sit, but pt continues to require min assist for initiate uprighting. Pt reports some dizziness upon sitting EOB, but resolves with time and educated pt on positional changes causing that. Pt continues to refuse to stand or ambulate due to being tired despite education and encouragement in order to gain strength to return home. Pt able to perform seated BLE strengthening exercises without loss of balance while seated EOB and denies dizziness. Pt requires min assist for sit to supine for BLE assistance and demonstrates slow, labored movements requiring increased time and additional verbal cues for hand placement to assist in lying supine. Pt left supine in bed, bed alarm on and call bell in hand. Patient will benefit from continued physical therapy in hospital and recommended venue below to increase strength, balance, endurance for safe ADLs and gait.    Follow Up Recommendations  Home health PT;Supervision for mobility/OOB;Supervision - Intermittent     Equipment Recommendations  None recommended by PT    Recommendations for Other Services       Precautions / Restrictions Precautions Precautions: Fall Restrictions Weight  Bearing Restrictions: No    Mobility  Bed Mobility Overal bed mobility: Needs Assistance Bed Mobility: Supine to Sit;Sit to Supine     Supine to sit: Min assist Sit to supine: Min assist   General bed mobility comments: slow, labored movement, fair return for propping on elbows for supine to sit, trunk uprighting assistance for rising and BLE uplighting assistance for return to supine  Transfers                 General transfer comment: pt refused  Ambulation/Gait             General Gait Details: pt refused   Stairs             Wheelchair Mobility    Modified Rankin (Stroke Patients Only)       Balance Overall balance assessment: Needs assistance Sitting-balance support: No upper extremity supported;Feet supported Sitting balance-Leahy Scale: Fair Sitting balance - Comments: fair/good seated EOB                                    Cognition Arousal/Alertness: Awake/alert Behavior During Therapy: WFL for tasks assessed/performed Overall Cognitive Status: History of cognitive impairments - at baseline                                        Exercises General Exercises - Lower Extremity Long Arc Quad: Seated;Strengthening;Both;10 reps Hip Flexion/Marching: Seated;Strengthening;Both;10 reps Toe Raises:  Seated;Strengthening;Both;10 reps Heel Raises: Seated;Strengthening;Both;10 reps    General Comments        Pertinent Vitals/Pain Pain Assessment: No/denies pain    Home Living                      Prior Function            PT Goals (current goals can now be found in the care plan section) Acute Rehab PT Goals Patient Stated Goal: return home PT Goal Formulation: With patient Time For Goal Achievement: 05/09/19 Potential to Achieve Goals: Good Progress towards PT goals: Progressing toward goals    Frequency    Min 3X/week      PT Plan Current plan remains appropriate     Co-evaluation              AM-PAC PT "6 Clicks" Mobility   Outcome Measure  Help needed turning from your back to your side while in a flat bed without using bedrails?: A Little Help needed moving from lying on your back to sitting on the side of a flat bed without using bedrails?: A Little Help needed moving to and from a bed to a chair (including a wheelchair)?: A Little Help needed standing up from a chair using your arms (e.g., wheelchair or bedside chair)?: A Little Help needed to walk in hospital room?: A Little Help needed climbing 3-5 steps with a railing? : A Lot 6 Click Score: 17    End of Session   Activity Tolerance: Patient tolerated treatment well;Patient limited by fatigue Patient left: with call bell/phone within reach;in bed;with bed alarm set Nurse Communication: Mobility status PT Visit Diagnosis: Other abnormalities of gait and mobility (R26.89);Unsteadiness on feet (R26.81);Muscle weakness (generalized) (M62.81)     Time: 1610-9604 PT Time Calculation (min) (ACUTE ONLY): 15 min  Charges:  $Therapeutic Exercise: 8-22 mins                      Tori Xandria Gallaga PT, DPT 05/07/19, 11:22 AM [phone]

## 2019-05-07 NOTE — Discharge Summary (Signed)
Physician Discharge Summary  ANDREUS CURE TGP:[id number] DOB: 10/08/1924 DOA: 05/04/2019  PCP: [name], MD  Admit date: 05/04/2019 Discharge date: 05/07/2019  Admitted From: [facility] Disposition:  [place]  Recommendations for Outpatient Follow-up:  1. Follow up with PCP in 1-2 weeks 2. Please obtain BMP/CBC in one week   Home Health: [facility]   Discharge Condition: Stable CODE STATUS: DNR Diet recommendation: Regular   Brief/Interim Summary: 85 year old male with a history of carotid sinus hypersensitivity status post PPM, dementia, hypertension, hypothyroidism, GERD, BPH status post TURP presenting after syncopal episode at [facility]. The patient was getting ready for physical therapy when he lost consciousness for period of 30 to 45 seconds. Unfortunately, additional history is not available secondary to patient's dementia. The patient himself denies any dizziness, chest pain, shortness breath, nausea, vomiting, diarrhea, abdominal pain. Notably, the patient recently had an EGD performed on 04/11/2019 which showed erosive gastropathy, Schatzki's ring which was dilated, and a large duodenal ulcer. He was brought to the hospital for further evaluation secondary to syncope. Work-up including a CT angiogram of the chest in the emergency department revealed a right lower lobe pulmonary embolus with no heart strain. The patient was started on IV heparin.  Discharge Diagnoses:  Left submandibular mass/Acute Parotitis -CT neck soft tissues--pronounced enlargement of L-parotid with soft tissue inflammation tracking inferiorly to left submandibular and parapharyngeal spaces -started merrem--had 3 days during the hospitalization -d/c back to [facility] with levofloxacin and clinda x 7 more days.  Acute PE -Echo no RV strain, EF 55-60% -received 3 days IV heparin -d/c with apixaban -stable on RA  Tachycardia -Personally reviewed telemetry -Patient has sinus tachycardia with frequent  PACs -Likely due to the patient's pulmonary embolus -Check TSH 1.795 -improved  Leukocytosis -Obtain UA--showed pyuria, but culture not done -May be related to the patient's sialoadenitis as well as stress demargination  Recent right femur intratrochanteric fracture -Status post gamma nail 03/09/2019 -The patient was discharged to the [facility] for rehab and returned subsequently back to [facility] -Patient had been receiving DVT prophylaxis postoperatively  Syncope -check orthostatics--neg -monitor on telemetry -echo--EF 55-60%, indeterminant diastolic fxn -may be related to acute PE  Essential hypertension -Continue carvedilol  Erosive gastropathy/duodenal ulcer -Continue Protonix  Dementia with behavioral disturbance -Patient is at risk for hospital delirium  Hypothyroidism -Continue Synthroid  CKD stage III -Baseline creatinine 0.8-1.1  Hypokalemia -repleted -mag 2.1   Discharge Instructions   Allergies as of 05/07/2019      Reactions   Prevacid [lansoprazole] Other (See Comments)   Does not remeber   Prilosec [omeprazole] Other (See Comments)   Does not remeber   Codeine Other (See Comments)   Does not know   Penicillins Rash   .Did it involve swelling of the face/tongue/throat, SOB, or low BP? No Did it involve sudden or severe rash/hives, skin peeling, or any reaction on the inside of your mouth or nose? Yes Did you need to seek medical attention at a hospital or doctor's office? Unknown When did it last happen?Unknown If all above answers are NO, may proceed with cephalosporin use.      Medication List    TAKE these medications   apixaban 5 MG Tabs tablet Commonly known as: ELIQUIS Take 2 tablets (10 mg total) by mouth 2 (two) times daily. Through 05/13/19.  Start 5 mg (1 tab) two times daily on 05/14/19   calcium carbonate 750 MG chewable tablet Commonly known as: TUMS EX Chew 1 tablet by mouth 2 (two) times  daily.    carvedilol 3.125 MG tablet Commonly known as: COREG Take 3.125 mg by mouth 2 (two) times daily with a meal.   clindamycin 300 MG capsule Commonly known as: CLEOCIN Take 1 capsule (300 mg total) by mouth every 8 (eight) hours.   ENSURE ENLIVE PO Take 1 Bottle by mouth daily.   feeding supplement (PRO-STAT SUGAR FREE 64) Liqd Take 30 mLs by mouth 2 (two) times daily between meals.   levofloxacin 500 MG tablet Commonly known as: LEVAQUIN Take 1 tablet (500 mg total) by mouth daily.   levothyroxine 75 MCG tablet Commonly known as: SYNTHROID Take 75 mcg by mouth daily before breakfast.   mirtazapine 7.5 MG tablet Commonly known as: REMERON Take 7.5 mg by mouth at bedtime.   NON FORMULARY Diet Type: REGULAR   pantoprazole 40 MG tablet Commonly known as: PROTONIX Take 40 mg by mouth 2 (two) times daily.   potassium chloride SA 20 MEQ tablet Commonly known as: K-DUR Take 20 mEq by mouth daily.   vitamin B-12 1000 MCG tablet Commonly known as: CYANOCOBALAMIN Take 1,000 mcg by mouth daily.       Allergies  Allergen Reactions   Prevacid [Lansoprazole] Other (See Comments)    Does not remeber   Prilosec [Omeprazole] Other (See Comments)    Does not remeber   Codeine Other (See Comments)    Does not know    Penicillins Rash    .Did it involve swelling of the face/tongue/throat, SOB, or low BP? No Did it involve sudden or severe rash/hives, skin peeling, or any reaction on the inside of your mouth or nose? Yes Did you need to seek medical attention at a hospital or doctor's office? Unknown When did it last happen?Unknown If all above answers are NO, may proceed with cephalosporin use.      Consultations:  none   Procedures/Studies: Ct Head Wo Contrast  Result Date: 05/04/2019 CLINICAL DATA:  Syncope with questionable fall EXAM: CT HEAD WITHOUT CONTRAST TECHNIQUE: Contiguous axial images were obtained from the base of the skull through the vertex  without intravenous contrast. COMPARISON:  November 24, 2004 FINDINGS: Brain: There is mild to moderate diffuse atrophy. There is a small cavum septum pellucidum, an anatomic variant. There is no intracranial mass, hemorrhage, extra-axial fluid collection, or midline shift. There is patchy small vessel disease in the centra semiovale bilaterally. There is small vessel disease in each thalamus. No acute infarct is evident. Vascular: There is no hyperdense vessel. There is calcification in each carotid siphon region. Skull: Bony calvarium appears intact. Sinuses/Orbits: There is opacification in the posterior aspect of each sphenoid sinus. There is mucosal thickening in several ethmoid air cells. There is a retention cyst in the right frontal sinus. Visualized orbits appear symmetric bilaterally. Other: Visualized mastoid air cells are clear. IMPRESSION: Mild-to-moderate atrophy with periventricular small vessel disease. Small vessel disease noted in each thalamus. No acute infarct. No mass or hemorrhage. There are foci of arterial vascular calcification. There are foci of paranasal sinus disease at several sites. Electronically Signed   By: Lowella Grip III M.D.   On: 05/04/2019 14:33   Ct Soft Tissue Neck Wo Contrast  Result Date: 05/05/2019 CLINICAL DATA:  85 year old male woke with left side facial swelling and redness. Remote history of vocal cord cancer. Small acute right lung PE diagnosed by CTA yesterday. EXAM: CT NECK WITHOUT CONTRAST TECHNIQUE: Multidetector CT imaging of the neck was performed following the standard protocol without intravenous contrast. COMPARISON:  CTA chest 05/04/2019.  Head CT 05/04/2019. FINDINGS: Pharynx and larynx: Laryngeal soft tissue contours remain normal. There is widespread left parapharyngeal soft tissue stranding, and asymmetric blunting of the left lateral pharynx. However, no discrete pharyngeal mass. The retropharyngeal space remains within normal limits. The right  parapharyngeal space is normal. Salivary glands: Negative noncontrast sublingual space. Both submandibular glands are diminutive. There is left submandibular space inflammatory stranding which may be involving or just obscuring the left submandibular gland. The right gland appears within normal limits. The right parotid gland is diminutive, normal. The left parotid gland is enlarged, and there is regional superficial soft tissue inflammation. A palpable skin marker was placed just posterior and inferior to the left parotid gland. No parotid duct dilatation is evident. No sialolithiasis. No discrete parotid mass in the absence of contrast. The left parotid was not included on the head CT yesterday. Thyroid: Highly diminutive. Lymph nodes: Soft tissue stranding tracking from the left parotid space through the left submandibular space and to the left parapharyngeal space with no superimposed left neck lymphadenopathy evident in the absence of contrast. Right neck lymph nodes also appear diminutive, normal. Vascular: Vascular patency is not evaluated in the absence of IV contrast. Left subclavian approach cardiac pacemaker leads. Mild for age carotid bifurcation calcified plaque. Calcified atherosclerosis at the skull base. Limited intracranial: Stable visible brain parenchyma. Visualized orbits: Negative. Mastoids and visualized paranasal sinuses: Small left sphenoid sinus fluid level is stable. Other Visualized paranasal sinuses and mastoids are stable and well pneumatized. Skeleton: Carious left posterior mandible molar, although this does not appear to be the epicenter of the soft tissue inflammation. Advanced degenerative changes in the cervical spine. Osteopenia. No acute osseous abnormality identified. Upper chest: Visible upper lungs remain clear. Stable and negative visible upper mediastinum. Calcified aortic atherosclerosis. Partially visible left chest pacemaker type device. IMPRESSION: 1. Pronounced  asymmetric enlargement of the left parotid gland, which was not visible on the head CT yesterday. Soft tissue inflammation surrounding the left parotid and tracking inferiorly in the left submandibular and parapharyngeal spaces. No sialolithiasis or left parotid duct enlargement identified. Favor Acute Infectious Parotitis, with secondary inflammation of the left lateral pharynx and possibly the left submandibular gland. 2. No cervical lymphadenopathy. Stable and negative visible upper chest. Electronically Signed   By: Genevie Ann M.D.   On: 05/05/2019 10:15   Ct Angio Chest Pe W Or Wo Contrast  Result Date: 05/04/2019 CLINICAL DATA:  Syncope and tachycardia EXAM: CT ANGIOGRAPHY CHEST WITH CONTRAST TECHNIQUE: Multidetector CT imaging of the chest was performed using the standard protocol during bolus administration of intravenous contrast. Multiplanar CT image reconstructions and MIPs were obtained to evaluate the vascular anatomy. CONTRAST:  166mL OMNIPAQUE IOHEXOL 350 MG/ML SOLN COMPARISON:  Chest radiograph May 04, 2019. FINDINGS: Cardiovascular: There is a focal small pulmonary embolus in the right lower lobe at the proximal posterior segment right lower lobe pulmonary artery branch. No more central pulmonary emboli are evident. There is no appreciable right heart strain; right ventricle to left ventricle diameter ratio is less than 0.9. There is aortic atherosclerosis. There is no thoracic aortic aneurysm or dissection. Visualized great vessels appear unremarkable. There is no pericardial effusion or pericardial thickening. Pacemaker present with leads attached to right atrium and right ventricle. There are scattered foci of coronary artery calcification. Mediastinum/Nodes: Thyroid is diminutive. No thyroid lesions evident. There is no demonstrable thoracic adenopathy. No esophageal lesions are evident. Lungs/Pleura: There is atelectatic change in the lung bases. There  is no edema or consolidation. No pleural  effusions are evident. Upper Abdomen: There is cholelithiasis. Gallbladder appears somewhat distended. There is aortic atherosclerosis as well as foci of calcification in visualized great vessels. Musculoskeletal: There is extensive thoracic arthropathy. There is bony hypertrophy at several levels in the lower thoracic spine with varying degrees of the spinal stenosis. Moderate spinal stenosis is noted at the L2-3 level. No blastic or lytic bone lesions are evident. Pacemaker device is noted superficially on the left anteriorly. Review of the MIP images confirms the above findings. IMPRESSION: 1. A single pulmonary embolus is noted in the proximal right lower lobe posterior segmental branch. No more central pulmonary embolus evident. No right heart strain. 2. No thoracic aortic aneurysm or dissection evident. There is aortic atherosclerosis. There are scattered foci of coronary artery calcification. Pacemaker leads attached to right atrium and right ventricle. 3. Posterior atelectatic change bilaterally. No lung edema or consolidation. No evident pleural effusion. 4.  No evident adenopathy. 5.  Cholelithiasis.  Gallbladder appears somewhat distended. 6. Extensive arthropathy in the thoracic spine and upper lumbar spine. Spinal stenosis in lower thoracic and upper lumbar regions, most notably at L2-3. Critical Value/emergent results were called by telephone at the time of interpretation on 05/04/2019 at 2:30 pm to Dr. Gerlene Fee , who verbally acknowledged these results. Electronically Signed   By: Lowella Grip III M.D.   On: 05/04/2019 14:30   Dg Chest Port 1 View  Result Date: 05/04/2019 CLINICAL DATA:  unresponsive for 30-45 sec. Pt says he doesn't remember passing out. Pt says he didn't sleep well last night and hasn't been eating their food. Reports had recently been at [facility] for rehab for hip fracture and was moved to [place] on 7/23. Pt alert and oriented x 4. HISTORY OF CANCER, HTN, DEMENTIA,  GERD EXAM: PORTABLE CHEST - 1 VIEW COMPARISON:  03/09/2019 FINDINGS: Lungs are clear. Heart size and mediastinal contours are within normal limits. Aortic Atherosclerosis (ICD10-170.0). Stable left subclavian dual lead transvenous pacemaker. No effusion. Advanced DJD in bilateral shoulders. IMPRESSION: No acute cardiopulmonary disease. Electronically Signed   By: Lucrezia Europe M.D.   On: 05/04/2019 13:05   Dg Hip Unilat With Pelvis 2-3 Views Right  Result Date: 04/10/2019 [facility] Postop fracture intratrochanteric status post internal fixation x-ray compared to June 6 Right intertrochanteric fracture pelvis film AP lateral right hip Standard gamma nail distal locking screws lag screw.  Tip to apex today's distance looks good and maintained from surgery fracture looks reduced No complications impression stable fracture fixation right hip        Discharge Exam: Vitals:   05/07/19 0915 05/07/19 1433  BP: (!) 122/59 103/61  Pulse: 75 70  Resp: 18 16  Temp: 98.3 F (36.8 C)   SpO2: 97% 98%   Vitals:   05/06/19 2124 05/07/19 0558 05/07/19 0915 05/07/19 1433  BP: 116/66 (!) 85/49 (!) 122/59 103/61  Pulse: (!) 54 74 75 70  Resp: 20 20 18 16   Temp: (!) 97.5 F (36.4 C) 98 F (36.7 C) 98.3 F (36.8 C)   TempSrc: Oral Oral Oral   SpO2: 96% 95% 97% 98%  Weight: 59.5 kg 59.3 kg      General: Pt is alert, awake, not in acute distress Cardiovascular: RRR, S1/S2 +, no rubs, no gallops Respiratory: bibasilar rales. No wheeze Abdominal: Soft, NT, ND, bowel sounds + Extremities: no edema, no cyanosis   The results of significant diagnostics from this hospitalization (including imaging, microbiology,  ancillary and laboratory) are listed below for reference.    Significant Diagnostic Studies: Ct Head Wo Contrast  Result Date: 05/04/2019 CLINICAL DATA:  Syncope with questionable fall EXAM: CT HEAD WITHOUT CONTRAST TECHNIQUE: Contiguous axial images were obtained from the base of the  skull through the vertex without intravenous contrast. COMPARISON:  November 24, 2004 FINDINGS: Brain: There is mild to moderate diffuse atrophy. There is a small cavum septum pellucidum, an anatomic variant. There is no intracranial mass, hemorrhage, extra-axial fluid collection, or midline shift. There is patchy small vessel disease in the centra semiovale bilaterally. There is small vessel disease in each thalamus. No acute infarct is evident. Vascular: There is no hyperdense vessel. There is calcification in each carotid siphon region. Skull: Bony calvarium appears intact. Sinuses/Orbits: There is opacification in the posterior aspect of each sphenoid sinus. There is mucosal thickening in several ethmoid air cells. There is a retention cyst in the right frontal sinus. Visualized orbits appear symmetric bilaterally. Other: Visualized mastoid air cells are clear. IMPRESSION: Mild-to-moderate atrophy with periventricular small vessel disease. Small vessel disease noted in each thalamus. No acute infarct. No mass or hemorrhage. There are foci of arterial vascular calcification. There are foci of paranasal sinus disease at several sites. Electronically Signed   By: Lowella Grip III M.D.   On: 05/04/2019 14:33   Ct Soft Tissue Neck Wo Contrast  Result Date: 05/05/2019 CLINICAL DATA:  85 year old male woke with left side facial swelling and redness. Remote history of vocal cord cancer. Small acute right lung PE diagnosed by CTA yesterday. EXAM: CT NECK WITHOUT CONTRAST TECHNIQUE: Multidetector CT imaging of the neck was performed following the standard protocol without intravenous contrast. COMPARISON:  CTA chest 05/04/2019.  Head CT 05/04/2019. FINDINGS: Pharynx and larynx: Laryngeal soft tissue contours remain normal. There is widespread left parapharyngeal soft tissue stranding, and asymmetric blunting of the left lateral pharynx. However, no discrete pharyngeal mass. The retropharyngeal space remains within  normal limits. The right parapharyngeal space is normal. Salivary glands: Negative noncontrast sublingual space. Both submandibular glands are diminutive. There is left submandibular space inflammatory stranding which may be involving or just obscuring the left submandibular gland. The right gland appears within normal limits. The right parotid gland is diminutive, normal. The left parotid gland is enlarged, and there is regional superficial soft tissue inflammation. A palpable skin marker was placed just posterior and inferior to the left parotid gland. No parotid duct dilatation is evident. No sialolithiasis. No discrete parotid mass in the absence of contrast. The left parotid was not included on the head CT yesterday. Thyroid: Highly diminutive. Lymph nodes: Soft tissue stranding tracking from the left parotid space through the left submandibular space and to the left parapharyngeal space with no superimposed left neck lymphadenopathy evident in the absence of contrast. Right neck lymph nodes also appear diminutive, normal. Vascular: Vascular patency is not evaluated in the absence of IV contrast. Left subclavian approach cardiac pacemaker leads. Mild for age carotid bifurcation calcified plaque. Calcified atherosclerosis at the skull base. Limited intracranial: Stable visible brain parenchyma. Visualized orbits: Negative. Mastoids and visualized paranasal sinuses: Small left sphenoid sinus fluid level is stable. Other Visualized paranasal sinuses and mastoids are stable and well pneumatized. Skeleton: Carious left posterior mandible molar, although this does not appear to be the epicenter of the soft tissue inflammation. Advanced degenerative changes in the cervical spine. Osteopenia. No acute osseous abnormality identified. Upper chest: Visible upper lungs remain clear. Stable and negative visible upper mediastinum. Calcified  aortic atherosclerosis. Partially visible left chest pacemaker type device.  IMPRESSION: 1. Pronounced asymmetric enlargement of the left parotid gland, which was not visible on the head CT yesterday. Soft tissue inflammation surrounding the left parotid and tracking inferiorly in the left submandibular and parapharyngeal spaces. No sialolithiasis or left parotid duct enlargement identified. Favor Acute Infectious Parotitis, with secondary inflammation of the left lateral pharynx and possibly the left submandibular gland. 2. No cervical lymphadenopathy. Stable and negative visible upper chest. Electronically Signed   By: Genevie Ann M.D.   On: 05/05/2019 10:15   Ct Angio Chest Pe W Or Wo Contrast  Result Date: 05/04/2019 CLINICAL DATA:  Syncope and tachycardia EXAM: CT ANGIOGRAPHY CHEST WITH CONTRAST TECHNIQUE: Multidetector CT imaging of the chest was performed using the standard protocol during bolus administration of intravenous contrast. Multiplanar CT image reconstructions and MIPs were obtained to evaluate the vascular anatomy. CONTRAST:  138mL OMNIPAQUE IOHEXOL 350 MG/ML SOLN COMPARISON:  Chest radiograph May 04, 2019. FINDINGS: Cardiovascular: There is a focal small pulmonary embolus in the right lower lobe at the proximal posterior segment right lower lobe pulmonary artery branch. No more central pulmonary emboli are evident. There is no appreciable right heart strain; right ventricle to left ventricle diameter ratio is less than 0.9. There is aortic atherosclerosis. There is no thoracic aortic aneurysm or dissection. Visualized great vessels appear unremarkable. There is no pericardial effusion or pericardial thickening. Pacemaker present with leads attached to right atrium and right ventricle. There are scattered foci of coronary artery calcification. Mediastinum/Nodes: Thyroid is diminutive. No thyroid lesions evident. There is no demonstrable thoracic adenopathy. No esophageal lesions are evident. Lungs/Pleura: There is atelectatic change in the lung bases. There is no edema or  consolidation. No pleural effusions are evident. Upper Abdomen: There is cholelithiasis. Gallbladder appears somewhat distended. There is aortic atherosclerosis as well as foci of calcification in visualized great vessels. Musculoskeletal: There is extensive thoracic arthropathy. There is bony hypertrophy at several levels in the lower thoracic spine with varying degrees of the spinal stenosis. Moderate spinal stenosis is noted at the L2-3 level. No blastic or lytic bone lesions are evident. Pacemaker device is noted superficially on the left anteriorly. Review of the MIP images confirms the above findings. IMPRESSION: 1. A single pulmonary embolus is noted in the proximal right lower lobe posterior segmental branch. No more central pulmonary embolus evident. No right heart strain. 2. No thoracic aortic aneurysm or dissection evident. There is aortic atherosclerosis. There are scattered foci of coronary artery calcification. Pacemaker leads attached to right atrium and right ventricle. 3. Posterior atelectatic change bilaterally. No lung edema or consolidation. No evident pleural effusion. 4.  No evident adenopathy. 5.  Cholelithiasis.  Gallbladder appears somewhat distended. 6. Extensive arthropathy in the thoracic spine and upper lumbar spine. Spinal stenosis in lower thoracic and upper lumbar regions, most notably at L2-3. Critical Value/emergent results were called by telephone at the time of interpretation on 05/04/2019 at 2:30 pm to Dr. Gerlene Fee , who verbally acknowledged these results. Electronically Signed   By: Lowella Grip III M.D.   On: 05/04/2019 14:30   Dg Chest Port 1 View  Result Date: 05/04/2019 CLINICAL DATA:  unresponsive for 30-45 sec. Pt says he doesn't remember passing out. Pt says he didn't sleep well last night and hasn't been eating their food. Reports had recently been at [facility] for rehab for hip fracture and was moved to [place] on 7/23. Pt alert and oriented x 4. HISTORY  OF CANCER, HTN, DEMENTIA, GERD EXAM: PORTABLE CHEST - 1 VIEW COMPARISON:  03/09/2019 FINDINGS: Lungs are clear. Heart size and mediastinal contours are within normal limits. Aortic Atherosclerosis (ICD10-170.0). Stable left subclavian dual lead transvenous pacemaker. No effusion. Advanced DJD in bilateral shoulders. IMPRESSION: No acute cardiopulmonary disease. Electronically Signed   By: Lucrezia Europe M.D.   On: 05/04/2019 13:05   Dg Hip Unilat With Pelvis 2-3 Views Right  Result Date: 04/10/2019 [facility] Postop fracture intratrochanteric status post internal fixation x-ray compared to June 6 Right intertrochanteric fracture pelvis film AP lateral right hip Standard gamma nail distal locking screws lag screw.  Tip to apex today's distance looks good and maintained from surgery fracture looks reduced No complications impression stable fracture fixation right hip     Microbiology: Recent Results (from the past 240 hour(s))  SARS Coronavirus 2 [facility] order, Performed in [place] hospital lab)     Status: None   Collection Time: 05/04/19  3:35 PM  Result Value Ref Range Status   SARS Coronavirus 2 NEGATIVE NEGATIVE Final    Comment: (NOTE) If result is NEGATIVE SARS-CoV-2 target nucleic acids are NOT DETECTED. The SARS-CoV-2 RNA is generally detectable in upper and lower  respiratory specimens during the acute phase of infection. The lowest  concentration of SARS-CoV-2 viral copies this assay can detect is 250  copies / mL. A negative result does not preclude SARS-CoV-2 infection  and should not be used as the sole basis for treatment or other  patient management decisions.  A negative result may occur with  improper specimen collection / handling, submission of specimen other  than nasopharyngeal swab, presence of viral mutation(s) within the  areas targeted by this assay, and inadequate number of viral copies  (<250 copies / mL). A negative result must be combined with clinical   observations, patient history, and epidemiological information. If result is POSITIVE SARS-CoV-2 target nucleic acids are DETECTED. The SARS-CoV-2 RNA is generally detectable in upper and lower  respiratory specimens dur ing the acute phase of infection.  Positive  results are indicative of active infection with SARS-CoV-2.  Clinical  correlation with patient history and other diagnostic information is  necessary to determine patient infection status.  Positive results do  not rule out bacterial infection or co-infection with other viruses. If result is PRESUMPTIVE POSTIVE SARS-CoV-2 nucleic acids MAY BE PRESENT.   A presumptive positive result was obtained on the submitted specimen  and confirmed on repeat testing.  While 2019 novel coronavirus  (SARS-CoV-2) nucleic acids may be present in the submitted sample  additional confirmatory testing may be necessary for epidemiological  and / or clinical management purposes  to differentiate between  SARS-CoV-2 and other Sarbecovirus currently known to infect humans.  If clinically indicated additional testing with an alternate test  methodology [phone]) is advised. The SARS-CoV-2 RNA is generally  detectable in upper and lower respiratory sp ecimens during the acute  phase of infection. The expected result is Negative. Fact Sheet for Patients:  StrictlyIdeas.no Fact Sheet for Healthcare Providers: BankingDealers.co.za This test is not yet approved or cleared by the [place] FDA and has been authorized for detection and/or diagnosis of SARS-CoV-2 by FDA under an Emergency Use Authorization (EUA).  This EUA will remain in effect (meaning this test can be used) for the duration of the COVID-19 declaration under Section 564(b)(1) of the Act, 21 U.S.C. section 360bbb-3(b)(1), unless the authorization is terminated or revoked sooner. Performed at [facility], [phone]  [address],  [place], [place] [address]   MRSA PCR Screening     Status: Abnormal   Collection Time: 05/04/19 10:58 PM   Specimen: Nasal Mucosa; Nasopharyngeal  Result Value Ref Range Status   MRSA by PCR POSITIVE (A) NEGATIVE Final    Comment:        The GeneXpert MRSA Assay (FDA approved for NASAL specimens only), is one component of a comprehensive MRSA colonization surveillance program. It is not intended to diagnose MRSA infection nor to guide or monitor treatment for MRSA infections. RESULT CALLED TO, READ BACK BY AND VERIFIED WITH: TATE,R.  ON 05/05/2019 AT 0554 BY EVA Performed at [facility], [address] [place], [place] [address]      Labs: Basic Metabolic Panel: Recent Labs  Lab 05/04/19 1236 05/05/19 0022 05/06/19 0409 05/07/19 0616  NA 140 141 143 144  K 5.1 4.0 3.3* 3.2*  CL 107 109 112* 115*  CO2 21* 21* 21* 22  GLUCOSE 167* 152* 111* 103*  BUN 23 21 21 21   CREATININE 1.21 1.10 1.02 1.00  CALCIUM 8.5* 8.4* 7.8* 7.5*  MG 1.7 1.7 2.1  --    Liver Function Tests: Recent Labs  Lab 05/04/19 1236  AST 43*  ALT 26  ALKPHOS 208*  BILITOT 1.5*  PROT 6.8  ALBUMIN 3.3*   No results for input(s): LIPASE, AMYLASE in the last 168 hours. No results for input(s): AMMONIA in the last 168 hours. CBC: Recent Labs  Lab 05/04/19 1236 05/05/19 0022 05/06/19 0409 05/07/19 0616  WBC 17.1* 19.3* 13.7* 6.6  HGB 16.1 15.4 13.2 11.7*  HCT 49.4 47.6 40.5 37.3*  MCV 103.1* 103.5* 102.0* 106.0*  PLT 158 156 140* 128*   Cardiac Enzymes: No results for input(s): CKTOTAL, CKMB, CKMBINDEX, TROPONINI in the last 168 hours. BNP: Invalid input(s): POCBNP CBG: No results for input(s): GLUCAP in the last 168 hours.  Time coordinating discharge:  36 minutes  Signed:  Orson Eva, DO [facility] Pager: [phone] 05/07/2019, 3:02 PM

## 2019-05-07 NOTE — Progress Notes (Signed)
ANTICOAGULATION CONSULT NOTE -   Pharmacy Consult for heparin Indication: pulmonary embolus  Allergies  Allergen Reactions  . Prevacid [Lansoprazole] Other (See Comments)    Does not remeber  . Prilosec [Omeprazole] Other (See Comments)    Does not remeber  . Codeine Other (See Comments)    Does not know   . Penicillins Rash    .Did it involve swelling of the face/tongue/throat, SOB, or low BP? No Did it involve sudden or severe rash/hives, skin peeling, or any reaction on the inside of your mouth or nose? Yes Did you need to seek medical attention at a hospital or doctor's office? Unknown When did it last happen?Unknown If all above answers are "NO", may proceed with cephalosporin use.      Patient Measurements: Weight: 130 lb 11.7 oz (59.3 kg)   Vital Signs: Temp: 98 F (36.7 C) (08/04 0558) Temp Source: Oral (08/04 0558) BP: 85/49 (08/04 0558) Pulse Rate: 74 (08/04 0558)  Labs: Recent Labs    05/04/19 1236 05/04/19 1432 05/05/19 0022  05/06/19 0409 05/06/19 0549 05/06/19 1610 05/07/19 0616  HGB 16.1  --  15.4  --  13.2  --   --  11.7*  HCT 49.4  --  47.6  --  40.5  --   --  37.3*  PLT 158  --  156  --  140*  --   --  128*  LABPROT 16.0*  --   --   --   --   --   --   --   INR 1.3*  --   --   --   --   --   --   --   HEPARINUNFRC  --   --  0.81*   < >  --  0.24* 0.40 0.35  CREATININE 1.21  --  1.10  --  1.02  --   --  1.00  TROPONINIHS 17 16  --   --   --   --   --   --    < > = values in this interval not displayed.    Estimated Creatinine Clearance: 39.5 mL/min (by C-G formula based on SCr of 1 mg/dL).   Medical History: Past Medical History:  Diagnosis Date  . BPH (benign prostatic hyperplasia)   . Carotid sinus hypersensitivity   . Carotid stenosis, asymptomatic, bilateral   . Dementia ([place])   . Femur fracture ([place])   . GERD (gastroesophageal reflux disease)   . Hypertension   . Hypothyroidism   . Osteoporosis   . PONV (postoperative  nausea and vomiting)   . 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 Amino Acids-Protein Hydrolys (FEEDING SUPPLEMENT, PRO-STAT SUGAR FREE 64,) LIQD Take 30 mLs by mouth 2 (two) times daily between meals.   05/04/2019 at 0700  . calcium carbonate (TUMS EX) 750 MG chewable tablet Chew 1 tablet by mouth 2 (two) times daily.   05/04/2019 at 0700  . carvedilol (COREG) 3.125 MG tablet Take 3.125 mg by mouth 2 (two) times daily with a meal.   05/04/2019 at 0700  . levothyroxine (SYNTHROID) 75 MCG tablet Take 75 mcg by mouth daily before breakfast.   05/04/2019 at 0600  . mirtazapine (REMERON) 7.5 MG tablet Take 7.5 mg by mouth at bedtime.   05/03/2019 at Unknown time  . NON FORMULARY Diet  Type: REGULAR     . Nutritional Supplements (ENSURE ENLIVE PO) Take 1 Bottle by mouth daily.   05/03/2019 at Unknown time  . pantoprazole (PROTONIX) 40 MG tablet Take 40 mg by mouth 2 (two) times daily.   05/04/2019 at 0700  . potassium chloride SA (K-DUR) 20 MEQ tablet Take 20 mEq by mouth daily.   05/04/2019 at 0700  . vitamin B-12 (CYANOCOBALAMIN) 1000 MCG tablet Take 1,000 mcg by mouth daily.   05/04/2019 at 0700    Assessment: Pharmacy consulted to dose heparin in patient with pulmonary embolism.  Patient is not on anticoagulants prior to admission. Heparin level remains therapeutic.  Goal of Therapy:  Heparin level 0.3-0.7 units/ml Monitor platelets by anticoagulation protocol: Yes   Plan:  Continue heparin infusion at 800 units/hr Check anti-Xa level  daily while on heparin Continue to monitor H&H and platelets  Isac Sarna, BS Vena [place], BCPS Clinical Pharmacist Pager [phone] 05/07/2019 9:14 AM

## 2019-05-07 NOTE — NC FL2 (Signed)
[place] LEVEL OF CARE SCREENING TOOL     IDENTIFICATION  Patient Name: Phillip Taylor Birthdate: 1924/11/24 Sex: male Admission Date (Current Location): 05/04/2019  [facility] and [place] Number:  [employer] and Address:  [place] [address], [place]      Provider Number: [id number]  Attending Physician Name and Address:  Orson Eva, MD  Relative Name and Phone Number:       Current Level of Care: Hospital Recommended Level of Care: [place] Prior Approval Number:    Date Approved/Denied:   PASRR Number:    Discharge Plan: Domiciliary (Rest home)    Current Diagnoses: Patient Active Problem List   Diagnosis Date Noted  . Acute parotitis 05/06/2019  . CKD (chronic kidney disease) stage 3, GFR 30-59 ml/min (HCC) 05/05/2019  . Pulmonary embolism ([place]) 05/04/2019  . Atrial fibrillation ([place]) 05/04/2019  . GERD without esophagitis 04/17/2019  . Poor appetite 04/09/2019  . Dysphagia 04/09/2019  . Weight loss 04/07/2019  . Protein-calorie malnutrition, severe ([place]) 03/30/2019  . Elevated liver enzymes 03/29/2019  . Vaso vagal episode 03/29/2019  . Hypokalemia 03/20/2019  . Chronic constipation 03/12/2019  . Carotid artery stenosis 03/12/2019  . Carotid sinus hypersensitivity 03/12/2019  . Hypocalcemia 03/12/2019  . Acute blood loss anemia 03/12/2019  . Dementia without behavioral disturbance ([place]) 03/12/2019  . Intertrochanteric fracture of right femur, closed, initial encounter ([place]) 03/09/2019  . Acute dehydration 03/09/2019  . Osteoporosis 03/09/2019  . Essential hypertension 03/09/2019  . Hypothyroidism 03/09/2019  . Intertrochanteric fracture ([place]) 03/09/2019  . Heme positive stool 10/17/2012    Orientation RESPIRATION BLADDER Height & Weight     Self  Normal External catheter Weight: 59.3 kg Height:     BEHAVIORAL SYMPTOMS/MOOD NEUROLOGICAL BOWEL NUTRITION STATUS      Continent Diet   AMBULATORY STATUS COMMUNICATION OF NEEDS Skin   Limited Assist Verbally Normal                       Personal Care Assistance Level of Assistance  Bathing, Feeding, Dressing Bathing Assistance: Limited assistance Feeding assistance: Limited assistance Dressing Assistance: Limited assistance     Functional Limitations Info  Sight, Speech, Hearing Sight Info: Adequate Hearing Info: Adequate Speech Info: Adequate    SPECIAL CARE FACTORS FREQUENCY  PT (By licensed PT), OT (By licensed OT)     PT Frequency: 3x/week OT Frequency: 3x/week            Contractures Contractures Info: Not present    Additional Factors Info  Code Status, Allergies Code Status Info: DNR Allergies Info: PREVACID, PRILOSEC, CODIENE, PENICILLIN           Current Medications (05/07/2019):  This is the current hospital active medication list Current Facility-Administered Medications  Medication Dose Route Frequency Provider Last Rate Last Dose  . acetaminophen (TYLENOL) tablet 650 mg  650 mg Oral Q4H PRN Truett [place], DO      . apixaban (ELIQUIS) tablet 10 mg  10 mg Oral BID Orson Eva, MD   10 mg at 05/07/19 1115   Followed by  . [START ON 05/14/2019] apixaban (ELIQUIS) tablet 5 mg  5 mg Oral BID Tat, Chayton, MD      . calcium carbonate (TUMS - dosed in mg elemental calcium) chewable tablet 200 mg of elemental calcium  1 tablet Oral BID Tat, Wilkin, MD   200 mg of elemental calcium at 05/06/19 2222  .  carvedilol (COREG) tablet 3.125 mg  3.125 mg Oral BID WC Truett Mainland, DO   3.125 mg at 05/07/19 0915  . Chlorhexidine Gluconate Cloth 2 % PADS 6 each  6 each Topical Q0600 Tat, Decklyn, MD   6 each at 05/07/19 0500  . clindamycin (CLEOCIN) capsule 300 mg  300 mg Oral Q8H Tat, Nox, MD      . feeding supplement (ENSURE ENLIVE) (ENSURE ENLIVE) liquid 237 mL  1 Bottle Oral Daily Truett Mainland, DO   237 mL at 05/06/19 0910  . feeding supplement (PRO-STAT SUGAR FREE 64) liquid 30 mL  30 mL Oral  BID BM Truett Mainland, DO   30 mL at 05/07/19 1331  . levofloxacin (LEVAQUIN) tablet 500 mg  500 mg Oral Daily Tat, Furious, MD      . levothyroxine (SYNTHROID) tablet 75 mcg  75 mcg Oral Q0600 Truett Mainland, DO   75 mcg at 05/07/19 0086  . mirtazapine (REMERON) tablet 7.5 mg  7.5 mg Oral QHS Truett Mainland, DO   7.5 mg at 05/06/19 2222  . mupirocin ointment (BACTROBAN) 2 % 1 application  1 application Nasal BID Orson Eva, MD   1 application at 76/19/50 [phone]  . ondansetron (ZOFRAN) injection 4 mg  4 mg Intravenous Q6H PRN Truett Mainland, DO      . pantoprazole (PROTONIX) EC tablet 40 mg  40 mg Oral BID Truett Mainland, DO   40 mg at 05/07/19 7124     Discharge Medications: Medication List    TAKE these medications   apixaban 5 MG Tabs tablet Commonly known as: ELIQUIS Take 2 tablets (10 mg total) by mouth 2 (two) times daily. Through 05/13/19.  Start 5 mg (1 tab) two times daily on 05/14/19   calcium carbonate 750 MG chewable tablet Commonly known as: TUMS EX Chew 1 tablet by mouth 2 (two) times daily.   carvedilol 3.125 MG tablet Commonly known as: COREG Take 3.125 mg by mouth 2 (two) times daily with a meal.   clindamycin 300 MG capsule Commonly known as: CLEOCIN Take 1 capsule (300 mg total) by mouth every 8 (eight) hours.   ENSURE ENLIVE PO Take 1 Bottle by mouth daily.   feeding supplement (PRO-STAT SUGAR FREE 64) Liqd Take 30 mLs by mouth 2 (two) times daily between meals.   levofloxacin 500 MG tablet Commonly known as: LEVAQUIN Take 1 tablet (500 mg total) by mouth daily.   levothyroxine 75 MCG tablet Commonly known as: SYNTHROID Take 75 mcg by mouth daily before breakfast.   mirtazapine 7.5 MG tablet Commonly known as: REMERON Take 7.5 mg by mouth at bedtime.   NON FORMULARY Diet Type: REGULAR   pantoprazole 40 MG tablet Commonly known as: PROTONIX Take 40 mg by mouth 2 (two) times daily.   potassium chloride SA 20 MEQ tablet Commonly  known as: K-DUR Take 20 mEq by mouth daily.   vitamin B-12 1000 MCG tablet Commonly known as: CYANOCOBALAMIN Take 1,000 mcg by mouth daily.           Relevant Imaging Results:  Relevant Lab Results:   Additional Information SSN [place]  Talayia Hjort, Chauncey Reading, RN

## 2019-05-07 NOTE — Progress Notes (Signed)
Nsg Discharge Note  Admit Date:  05/04/2019 Discharge date: 05/07/2019   Phillip Taylor to be D/C'd back to [facility] per MD order.  AVS completed.  Copy for chart, and copy for patient signed, and dated. Patient/caregiver able to verbalize understanding.  Discharge Medication: Allergies as of 05/07/2019      Reactions   Prevacid [lansoprazole] Other (See Comments)   Does not remeber   Prilosec [omeprazole] Other (See Comments)   Does not remeber   Codeine Other (See Comments)   Does not know   Penicillins Rash   .Did it involve swelling of the face/tongue/throat, SOB, or low BP? No Did it involve sudden or severe rash/hives, skin peeling, or any reaction on the inside of your mouth or nose? Yes Did you need to seek medical attention at a hospital or doctor's office? Unknown When did it last happen?Unknown If all above answers are "NO", may proceed with cephalosporin use.      Medication List    TAKE these medications   apixaban 5 MG Tabs tablet Commonly known as: ELIQUIS Take 2 tablets (10 mg total) by mouth 2 (two) times daily. Through 05/13/19.  Start 5 mg (1 tab) two times daily on 05/14/19   calcium carbonate 750 MG chewable tablet Commonly known as: TUMS EX Chew 1 tablet by mouth 2 (two) times daily.   carvedilol 3.125 MG tablet Commonly known as: COREG Take 3.125 mg by mouth 2 (two) times daily with a meal.   clindamycin 300 MG capsule Commonly known as: CLEOCIN Take 1 capsule (300 mg total) by mouth every 8 (eight) hours.   ENSURE ENLIVE PO Take 1 Bottle by mouth daily.   feeding supplement (PRO-STAT SUGAR FREE 64) Liqd Take 30 mLs by mouth 2 (two) times daily between meals.   levofloxacin 500 MG tablet Commonly known as: LEVAQUIN Take 1 tablet (500 mg total) by mouth daily.   levothyroxine 75 MCG tablet Commonly known as: SYNTHROID Take 75 mcg by mouth daily before breakfast.   mirtazapine 7.5 MG tablet Commonly known as: REMERON Take 7.5 mg by mouth  at bedtime.   NON FORMULARY Diet Type: REGULAR   pantoprazole 40 MG tablet Commonly known as: PROTONIX Take 40 mg by mouth 2 (two) times daily.   potassium chloride SA 20 MEQ tablet Commonly known as: K-DUR Take 20 mEq by mouth daily.   vitamin B-12 1000 MCG tablet Commonly known as: CYANOCOBALAMIN Take 1,000 mcg by mouth daily.       Discharge Assessment: Vitals:   05/07/19 0915 05/07/19 1433  BP: (!) 122/59 103/61  Pulse: 75 70  Resp: 18 16  Temp: 98.3 F (36.8 C)   SpO2: 97% 98%   Skin clean, dry and intact without evidence of skin break down, no evidence of skin tears noted. IV catheter discontinued intact. Site without signs and symptoms of complications - no redness or edema noted at insertion site, patient denies c/o pain - only slight tenderness at site.  Dressing with slight pressure applied.  D/c Instructions-Education: Discharge instructions given to patient/family with verbalized understanding. D/c education completed with patient/family including follow up instructions, medication list, d/c activities limitations if indicated, with other d/c instructions as indicated by MD - patient able to verbalize understanding, all questions fully answered. Patient instructed to return to ED, call 911, or call MD for any changes in condition.  Patient escorted via [place], and D/C home via private auto.  Venita Sheffield, RN 05/07/2019 4:42 PM

## 2019-05-07 NOTE — TOC Transition Note (Signed)
Transition of Care [facility]) - CM/SW Discharge Note   Patient Details  Name: Phillip Taylor MRN: [id number] Date of Birth: [birth year]  [facility] CM/SW Contact:  Vondell Sowell, Chauncey [place], RN Phone Number: 05/07/2019, 3:47 PM   Clinical Narrative:   Patient discharging back to [facility]. [name] of [facility] will pick up patient. DC clinicals faxed.     Final next level of care: [place] Barriers to Discharge: No Barriers Identified     Post Acute Care Choice: Home Health, Resumption of Svcs/PTA Provider                      Readmission Risk Interventions Readmission Risk Prevention Plan 05/06/2019 03/11/2019  Post Dischage Appt - Not Complete  Appt Comments - Pt going SNF rehab. SNF MD will follow.  Medication Screening - Complete  Transportation Screening Complete Complete  PCP or Specialist Appt within 3-5 Days Complete -  HRI or Home Care Consult Complete -  Social Work Consult for Recovery Care Planning/Counseling Complete -  Palliative Care Screening Complete -  Medication Review [employer]) Complete -  Some recent data might be hidden

## 2019-05-08 ENCOUNTER — Telehealth: Payer: Self-pay

## 2019-05-08 ENCOUNTER — Other Ambulatory Visit: Payer: Self-pay

## 2019-05-08 MED ORDER — FLUCONAZOLE 100 MG PO TABS: ORAL_TABLET | ORAL | Status: DC

## 2019-05-08 NOTE — Telephone Encounter (Signed)
Received a call back from [facility] cardiology, pt hasn't been seen there in over a year and they don't manage pts Eliquis. I will check with pts PCP.

## 2019-05-08 NOTE — Telephone Encounter (Signed)
Orders for Diflucan were sent to pt's pharmacy on 04/11/2019 per RMR to treat candida esophagitis (Diflucan - take 200 mg on day 1, then 100 mg daily for 21 days). Letter was mailed to pt's home on 04/25/2019 since I wasn't able to contact pt. Confirmed with the pharmacy, pt's RX was received but not picked up from the pt.

## 2019-05-08 NOTE — Telephone Encounter (Signed)
Pt arrived at [facility] this morning. I spoke with pt's nurse at [phone]. Pt's nurse was notified of pt's condition candida esophagitis and I was asked to send RX to [facility] (Fluconazole 200 mg on day 1, then 100 mg daily x 21 days). Spoke with the pharmacist at [facility] and he said the Fluconazole could increase the blood levels of the Eliquis pt is on. The pharmacist asked me to check with pts cardiologist.  I called pt's cardiologist Dorothe Pea, MD at [facility] and her nurse is checking to make sure it will be ok for pt to take the Fluconazole at this time. They will give a call back in 24-48 hours.

## 2019-05-09 ENCOUNTER — Encounter: Payer: Self-pay | Admitting: Internal Medicine

## 2019-05-09 DIAGNOSIS — M80051D Age-related osteoporosis with current pathological fracture, right femur, subsequent encounter for fracture with routine healing: Secondary | ICD-10-CM | POA: Diagnosis not present

## 2019-05-09 DIAGNOSIS — E039 Hypothyroidism, unspecified: Secondary | ICD-10-CM | POA: Diagnosis not present

## 2019-05-09 DIAGNOSIS — W19XXXD Unspecified fall, subsequent encounter: Secondary | ICD-10-CM | POA: Diagnosis not present

## 2019-05-09 DIAGNOSIS — F0280 Dementia in other diseases classified elsewhere without behavioral disturbance: Secondary | ICD-10-CM | POA: Diagnosis not present

## 2019-05-09 DIAGNOSIS — I10 Essential (primary) hypertension: Secondary | ICD-10-CM | POA: Diagnosis not present

## 2019-05-09 DIAGNOSIS — I6523 Occlusion and stenosis of bilateral carotid arteries: Secondary | ICD-10-CM | POA: Diagnosis not present

## 2019-05-09 NOTE — Telephone Encounter (Signed)
Routing to Dr. Rourk  

## 2019-05-09 NOTE — Telephone Encounter (Signed)
Noted. Spoke with [employer] and the order prescribed yesterday was d/c. New very order for Diflucan 100 mg qod x 21 days with no refill was given verbally. New order will be sent to the nursing home by [facility] today.

## 2019-05-09 NOTE — Telephone Encounter (Signed)
Patient's medical issues are becoming more complex with recent diagnosis of PE and now on anticoagulation.  Potential drug interactions with Diflucan and Eliquis noted.  I am modifying his new prescription for Diflucan as follows.  Decrease Diflucan to 100 mg every other day x21 days.  Discard excess medication with prescription.  No refills.  Thanks.

## 2019-05-09 NOTE — Telephone Encounter (Signed)
Thanks; we need stool antigen testing as well

## 2019-05-10 ENCOUNTER — Other Ambulatory Visit: Payer: Self-pay

## 2019-05-10 NOTE — Telephone Encounter (Signed)
HP stool antigen testing

## 2019-05-10 NOTE — Telephone Encounter (Signed)
The stool testing needed, is it Giardia stool antigen?

## 2019-05-13 ENCOUNTER — Other Ambulatory Visit: Payer: Self-pay

## 2019-05-13 ENCOUNTER — Other Ambulatory Visit: Payer: Self-pay | Admitting: Internal Medicine

## 2019-05-13 DIAGNOSIS — I6523 Occlusion and stenosis of bilateral carotid arteries: Secondary | ICD-10-CM | POA: Diagnosis not present

## 2019-05-13 DIAGNOSIS — R634 Abnormal weight loss: Secondary | ICD-10-CM

## 2019-05-13 DIAGNOSIS — I10 Essential (primary) hypertension: Secondary | ICD-10-CM | POA: Diagnosis not present

## 2019-05-13 DIAGNOSIS — F0280 Dementia in other diseases classified elsewhere without behavioral disturbance: Secondary | ICD-10-CM | POA: Diagnosis not present

## 2019-05-13 DIAGNOSIS — E039 Hypothyroidism, unspecified: Secondary | ICD-10-CM | POA: Diagnosis not present

## 2019-05-13 DIAGNOSIS — M80051D Age-related osteoporosis with current pathological fracture, right femur, subsequent encounter for fracture with routine healing: Secondary | ICD-10-CM | POA: Diagnosis not present

## 2019-05-13 DIAGNOSIS — K259 Gastric ulcer, unspecified as acute or chronic, without hemorrhage or perforation: Secondary | ICD-10-CM

## 2019-05-13 DIAGNOSIS — W19XXXD Unspecified fall, subsequent encounter: Secondary | ICD-10-CM | POA: Diagnosis not present

## 2019-05-13 NOTE — Telephone Encounter (Signed)
Spoke with Helene Kelp at [facility], ok to fax quest orders to [phone]. Lab order done and faxed.

## 2019-05-16 DIAGNOSIS — W19XXXD Unspecified fall, subsequent encounter: Secondary | ICD-10-CM | POA: Diagnosis not present

## 2019-05-16 DIAGNOSIS — I10 Essential (primary) hypertension: Secondary | ICD-10-CM | POA: Diagnosis not present

## 2019-05-16 DIAGNOSIS — F0280 Dementia in other diseases classified elsewhere without behavioral disturbance: Secondary | ICD-10-CM | POA: Diagnosis not present

## 2019-05-16 DIAGNOSIS — E039 Hypothyroidism, unspecified: Secondary | ICD-10-CM | POA: Diagnosis not present

## 2019-05-16 DIAGNOSIS — I6523 Occlusion and stenosis of bilateral carotid arteries: Secondary | ICD-10-CM | POA: Diagnosis not present

## 2019-05-16 DIAGNOSIS — M80051D Age-related osteoporosis with current pathological fracture, right femur, subsequent encounter for fracture with routine healing: Secondary | ICD-10-CM | POA: Diagnosis not present

## 2019-05-17 ENCOUNTER — Telehealth: Payer: Self-pay | Admitting: Orthopedic Surgery

## 2019-05-17 DIAGNOSIS — F0280 Dementia in other diseases classified elsewhere without behavioral disturbance: Secondary | ICD-10-CM | POA: Diagnosis not present

## 2019-05-17 DIAGNOSIS — I6523 Occlusion and stenosis of bilateral carotid arteries: Secondary | ICD-10-CM | POA: Diagnosis not present

## 2019-05-17 DIAGNOSIS — E039 Hypothyroidism, unspecified: Secondary | ICD-10-CM | POA: Diagnosis not present

## 2019-05-17 DIAGNOSIS — W19XXXD Unspecified fall, subsequent encounter: Secondary | ICD-10-CM | POA: Diagnosis not present

## 2019-05-17 DIAGNOSIS — S72001D Fracture of unspecified part of neck of right femur, subsequent encounter for closed fracture with routine healing: Secondary | ICD-10-CM

## 2019-05-17 DIAGNOSIS — M80051D Age-related osteoporosis with current pathological fracture, right femur, subsequent encounter for fracture with routine healing: Secondary | ICD-10-CM | POA: Diagnosis not present

## 2019-05-17 DIAGNOSIS — I10 Essential (primary) hypertension: Secondary | ICD-10-CM | POA: Diagnosis not present

## 2019-05-17 NOTE — Telephone Encounter (Signed)
[name] at [facility] [place], [phone], had left message for Dr Aline Brochure as of 05/16/19, relaying that patient is quarantined for 2 weeks; protocol is any time resident leaves facility, must be quarantined for 14 days.

## 2019-05-20 DIAGNOSIS — F0280 Dementia in other diseases classified elsewhere without behavioral disturbance: Secondary | ICD-10-CM | POA: Diagnosis not present

## 2019-05-20 DIAGNOSIS — W19XXXD Unspecified fall, subsequent encounter: Secondary | ICD-10-CM | POA: Diagnosis not present

## 2019-05-20 DIAGNOSIS — E039 Hypothyroidism, unspecified: Secondary | ICD-10-CM | POA: Diagnosis not present

## 2019-05-20 DIAGNOSIS — I10 Essential (primary) hypertension: Secondary | ICD-10-CM | POA: Diagnosis not present

## 2019-05-20 DIAGNOSIS — I6523 Occlusion and stenosis of bilateral carotid arteries: Secondary | ICD-10-CM | POA: Diagnosis not present

## 2019-05-20 DIAGNOSIS — M80051D Age-related osteoporosis with current pathological fracture, right femur, subsequent encounter for fracture with routine healing: Secondary | ICD-10-CM | POA: Diagnosis not present

## 2019-05-20 NOTE — Telephone Encounter (Signed)
Pt's nurse returned call. The nurse states that they  haven't completed the lab test( H. Pylori stool) and are working on it. They are aware that RMR is waiting on these results. They will fax results to our office when they receive results.

## 2019-05-20 NOTE — Telephone Encounter (Signed)
Can we please get a progress report on how this gentleman is doing as far as oral intake, etc.

## 2019-05-20 NOTE — Telephone Encounter (Signed)
I called the nursing home and asked to speak with the nursing director, Evette Georges.  I spoke with Ms. Hall and she stated that they've been trying to obtain a sample, however the patient is confused and when he uses his bathroom he will not let them know.  Are you ok with them collecting the sample from his depend?   Also, she stated his oral intake is not good.  They have to really work with him to get him to eat.   Ms. [place] Crane can be reached at [phone].

## 2019-05-20 NOTE — Telephone Encounter (Signed)
yes

## 2019-05-20 NOTE — Telephone Encounter (Signed)
Called [facility] to speak with pt's nurse. Pt's nurse is going to call back per the receptionist. Will discuss testing needed with pt's nurse.

## 2019-05-21 DIAGNOSIS — F0280 Dementia in other diseases classified elsewhere without behavioral disturbance: Secondary | ICD-10-CM | POA: Diagnosis not present

## 2019-05-21 DIAGNOSIS — M80051D Age-related osteoporosis with current pathological fracture, right femur, subsequent encounter for fracture with routine healing: Secondary | ICD-10-CM | POA: Diagnosis not present

## 2019-05-21 DIAGNOSIS — I6523 Occlusion and stenosis of bilateral carotid arteries: Secondary | ICD-10-CM | POA: Diagnosis not present

## 2019-05-21 DIAGNOSIS — W19XXXD Unspecified fall, subsequent encounter: Secondary | ICD-10-CM | POA: Diagnosis not present

## 2019-05-21 DIAGNOSIS — E039 Hypothyroidism, unspecified: Secondary | ICD-10-CM | POA: Diagnosis not present

## 2019-05-21 DIAGNOSIS — I10 Essential (primary) hypertension: Secondary | ICD-10-CM | POA: Diagnosis not present

## 2019-05-21 NOTE — Telephone Encounter (Signed)
I spoke with Ms. Hall and made her aware that per RMR it's ok to retrieve stool sample from the pt's depend.  She stated she will try to get that sample today.

## 2019-05-22 DIAGNOSIS — I6523 Occlusion and stenosis of bilateral carotid arteries: Secondary | ICD-10-CM | POA: Diagnosis not present

## 2019-05-22 DIAGNOSIS — M80051D Age-related osteoporosis with current pathological fracture, right femur, subsequent encounter for fracture with routine healing: Secondary | ICD-10-CM | POA: Diagnosis not present

## 2019-05-22 DIAGNOSIS — W19XXXD Unspecified fall, subsequent encounter: Secondary | ICD-10-CM | POA: Diagnosis not present

## 2019-05-22 DIAGNOSIS — F0280 Dementia in other diseases classified elsewhere without behavioral disturbance: Secondary | ICD-10-CM | POA: Diagnosis not present

## 2019-05-22 DIAGNOSIS — I10 Essential (primary) hypertension: Secondary | ICD-10-CM | POA: Diagnosis not present

## 2019-05-22 DIAGNOSIS — E039 Hypothyroidism, unspecified: Secondary | ICD-10-CM | POA: Diagnosis not present

## 2019-05-22 NOTE — Telephone Encounter (Signed)
Ok follow quarantine

## 2019-05-23 DIAGNOSIS — I10 Essential (primary) hypertension: Secondary | ICD-10-CM | POA: Diagnosis not present

## 2019-05-23 DIAGNOSIS — M80051D Age-related osteoporosis with current pathological fracture, right femur, subsequent encounter for fracture with routine healing: Secondary | ICD-10-CM | POA: Diagnosis not present

## 2019-05-23 DIAGNOSIS — E039 Hypothyroidism, unspecified: Secondary | ICD-10-CM | POA: Diagnosis not present

## 2019-05-23 DIAGNOSIS — W19XXXD Unspecified fall, subsequent encounter: Secondary | ICD-10-CM | POA: Diagnosis not present

## 2019-05-23 DIAGNOSIS — I6523 Occlusion and stenosis of bilateral carotid arteries: Secondary | ICD-10-CM | POA: Diagnosis not present

## 2019-05-23 DIAGNOSIS — F0280 Dementia in other diseases classified elsewhere without behavioral disturbance: Secondary | ICD-10-CM | POA: Diagnosis not present

## 2019-05-23 NOTE — Telephone Encounter (Signed)
Relayed to [place], med [employer]. States patient will be able to come back out for appointment by 06/19/19 for follow up visit/Xray,. States if Dr Aline Brochure would like to have mobile Xrays done at facility to be done 06/05/19(original appointment) please fax order to fax# [phone]

## 2019-05-24 DIAGNOSIS — I6523 Occlusion and stenosis of bilateral carotid arteries: Secondary | ICD-10-CM | POA: Diagnosis not present

## 2019-05-24 DIAGNOSIS — E039 Hypothyroidism, unspecified: Secondary | ICD-10-CM | POA: Diagnosis not present

## 2019-05-24 DIAGNOSIS — W19XXXD Unspecified fall, subsequent encounter: Secondary | ICD-10-CM | POA: Diagnosis not present

## 2019-05-24 DIAGNOSIS — I10 Essential (primary) hypertension: Secondary | ICD-10-CM | POA: Diagnosis not present

## 2019-05-24 DIAGNOSIS — M80051D Age-related osteoporosis with current pathological fracture, right femur, subsequent encounter for fracture with routine healing: Secondary | ICD-10-CM | POA: Diagnosis not present

## 2019-05-24 DIAGNOSIS — F0280 Dementia in other diseases classified elsewhere without behavioral disturbance: Secondary | ICD-10-CM | POA: Diagnosis not present

## 2019-05-26 DIAGNOSIS — F0280 Dementia in other diseases classified elsewhere without behavioral disturbance: Secondary | ICD-10-CM | POA: Diagnosis not present

## 2019-05-26 DIAGNOSIS — N400 Enlarged prostate without lower urinary tract symptoms: Secondary | ICD-10-CM | POA: Diagnosis not present

## 2019-05-26 DIAGNOSIS — Z950 Presence of cardiac pacemaker: Secondary | ICD-10-CM | POA: Diagnosis not present

## 2019-05-26 DIAGNOSIS — M80051D Age-related osteoporosis with current pathological fracture, right femur, subsequent encounter for fracture with routine healing: Secondary | ICD-10-CM | POA: Diagnosis not present

## 2019-05-26 DIAGNOSIS — N183 Chronic kidney disease, stage 3 (moderate): Secondary | ICD-10-CM | POA: Diagnosis not present

## 2019-05-26 DIAGNOSIS — E039 Hypothyroidism, unspecified: Secondary | ICD-10-CM | POA: Diagnosis not present

## 2019-05-26 DIAGNOSIS — Z8521 Personal history of malignant neoplasm of larynx: Secondary | ICD-10-CM | POA: Diagnosis not present

## 2019-05-26 DIAGNOSIS — W19XXXD Unspecified fall, subsequent encounter: Secondary | ICD-10-CM | POA: Diagnosis not present

## 2019-05-26 DIAGNOSIS — I2699 Other pulmonary embolism without acute cor pulmonale: Secondary | ICD-10-CM | POA: Diagnosis not present

## 2019-05-26 DIAGNOSIS — Z792 Long term (current) use of antibiotics: Secondary | ICD-10-CM | POA: Diagnosis not present

## 2019-05-26 DIAGNOSIS — K269 Duodenal ulcer, unspecified as acute or chronic, without hemorrhage or perforation: Secondary | ICD-10-CM | POA: Diagnosis not present

## 2019-05-26 DIAGNOSIS — I6523 Occlusion and stenosis of bilateral carotid arteries: Secondary | ICD-10-CM | POA: Diagnosis not present

## 2019-05-26 DIAGNOSIS — K1121 Acute sialoadenitis: Secondary | ICD-10-CM | POA: Diagnosis not present

## 2019-05-26 DIAGNOSIS — Z9181 History of falling: Secondary | ICD-10-CM | POA: Diagnosis not present

## 2019-05-26 DIAGNOSIS — I129 Hypertensive chronic kidney disease with stage 1 through stage 4 chronic kidney disease, or unspecified chronic kidney disease: Secondary | ICD-10-CM | POA: Diagnosis not present

## 2019-05-27 NOTE — Telephone Encounter (Signed)
Have faxed

## 2019-05-28 ENCOUNTER — Encounter: Payer: Self-pay | Admitting: Orthopedic Surgery

## 2019-05-28 DIAGNOSIS — F0280 Dementia in other diseases classified elsewhere without behavioral disturbance: Secondary | ICD-10-CM | POA: Diagnosis not present

## 2019-05-28 DIAGNOSIS — R102 Pelvic and perineal pain: Secondary | ICD-10-CM | POA: Diagnosis not present

## 2019-05-28 DIAGNOSIS — K1121 Acute sialoadenitis: Secondary | ICD-10-CM | POA: Diagnosis not present

## 2019-05-28 DIAGNOSIS — W19XXXD Unspecified fall, subsequent encounter: Secondary | ICD-10-CM | POA: Diagnosis not present

## 2019-05-28 DIAGNOSIS — M80051D Age-related osteoporosis with current pathological fracture, right femur, subsequent encounter for fracture with routine healing: Secondary | ICD-10-CM | POA: Diagnosis not present

## 2019-05-28 DIAGNOSIS — E039 Hypothyroidism, unspecified: Secondary | ICD-10-CM | POA: Diagnosis not present

## 2019-05-28 DIAGNOSIS — M25551 Pain in right hip: Secondary | ICD-10-CM | POA: Diagnosis not present

## 2019-05-28 DIAGNOSIS — I2699 Other pulmonary embolism without acute cor pulmonale: Secondary | ICD-10-CM | POA: Diagnosis not present

## 2019-05-30 DIAGNOSIS — W19XXXD Unspecified fall, subsequent encounter: Secondary | ICD-10-CM | POA: Diagnosis not present

## 2019-05-30 DIAGNOSIS — K219 Gastro-esophageal reflux disease without esophagitis: Secondary | ICD-10-CM | POA: Diagnosis not present

## 2019-05-30 DIAGNOSIS — M6281 Muscle weakness (generalized): Secondary | ICD-10-CM | POA: Diagnosis not present

## 2019-05-30 DIAGNOSIS — K1121 Acute sialoadenitis: Secondary | ICD-10-CM | POA: Diagnosis not present

## 2019-05-30 DIAGNOSIS — R638 Other symptoms and signs concerning food and fluid intake: Secondary | ICD-10-CM | POA: Diagnosis not present

## 2019-05-30 DIAGNOSIS — I2699 Other pulmonary embolism without acute cor pulmonale: Secondary | ICD-10-CM | POA: Diagnosis not present

## 2019-05-30 DIAGNOSIS — F0280 Dementia in other diseases classified elsewhere without behavioral disturbance: Secondary | ICD-10-CM | POA: Diagnosis not present

## 2019-05-30 DIAGNOSIS — M80051D Age-related osteoporosis with current pathological fracture, right femur, subsequent encounter for fracture with routine healing: Secondary | ICD-10-CM | POA: Diagnosis not present

## 2019-05-30 DIAGNOSIS — E039 Hypothyroidism, unspecified: Secondary | ICD-10-CM | POA: Diagnosis not present

## 2019-06-03 DIAGNOSIS — F0280 Dementia in other diseases classified elsewhere without behavioral disturbance: Secondary | ICD-10-CM | POA: Diagnosis not present

## 2019-06-03 DIAGNOSIS — I2699 Other pulmonary embolism without acute cor pulmonale: Secondary | ICD-10-CM | POA: Diagnosis not present

## 2019-06-03 DIAGNOSIS — W19XXXD Unspecified fall, subsequent encounter: Secondary | ICD-10-CM | POA: Diagnosis not present

## 2019-06-03 DIAGNOSIS — K1121 Acute sialoadenitis: Secondary | ICD-10-CM | POA: Diagnosis not present

## 2019-06-03 DIAGNOSIS — M80051D Age-related osteoporosis with current pathological fracture, right femur, subsequent encounter for fracture with routine healing: Secondary | ICD-10-CM | POA: Diagnosis not present

## 2019-06-03 DIAGNOSIS — E039 Hypothyroidism, unspecified: Secondary | ICD-10-CM | POA: Diagnosis not present

## 2019-06-04 ENCOUNTER — Telehealth: Payer: Self-pay | Admitting: Internal Medicine

## 2019-06-04 ENCOUNTER — Encounter: Payer: Self-pay | Admitting: Internal Medicine

## 2019-06-04 DIAGNOSIS — Z4509 Encounter for adjustment and management of other cardiac device: Secondary | ICD-10-CM | POA: Diagnosis not present

## 2019-06-04 DIAGNOSIS — F0280 Dementia in other diseases classified elsewhere without behavioral disturbance: Secondary | ICD-10-CM | POA: Diagnosis not present

## 2019-06-04 DIAGNOSIS — Z950 Presence of cardiac pacemaker: Secondary | ICD-10-CM | POA: Diagnosis not present

## 2019-06-04 DIAGNOSIS — M80051D Age-related osteoporosis with current pathological fracture, right femur, subsequent encounter for fracture with routine healing: Secondary | ICD-10-CM | POA: Diagnosis not present

## 2019-06-04 DIAGNOSIS — Z4502 Encounter for adjustment and management of automatic implantable cardiac defibrillator: Secondary | ICD-10-CM | POA: Diagnosis not present

## 2019-06-04 DIAGNOSIS — E039 Hypothyroidism, unspecified: Secondary | ICD-10-CM | POA: Diagnosis not present

## 2019-06-04 DIAGNOSIS — K1121 Acute sialoadenitis: Secondary | ICD-10-CM | POA: Diagnosis not present

## 2019-06-04 DIAGNOSIS — I495 Sick sinus syndrome: Secondary | ICD-10-CM | POA: Diagnosis not present

## 2019-06-04 DIAGNOSIS — I2699 Other pulmonary embolism without acute cor pulmonale: Secondary | ICD-10-CM | POA: Diagnosis not present

## 2019-06-04 DIAGNOSIS — W19XXXD Unspecified fall, subsequent encounter: Secondary | ICD-10-CM | POA: Diagnosis not present

## 2019-06-04 NOTE — Telephone Encounter (Signed)
PATIENT SCHEDULED  °

## 2019-06-04 NOTE — Telephone Encounter (Signed)
Noted  

## 2019-06-04 NOTE — Telephone Encounter (Signed)
I spoke with [name], resident care coordinator and Larrie Kass, Nurse for Phillip Taylor and they stated they have not been able to obtain a stool sample, because the patient is going to the bathroom and not telling them when he goes.  Routing to Dr. Gala Romney for advice.   [phone]

## 2019-06-04 NOTE — Telephone Encounter (Signed)
Routing to RMR for him to follow-up with Nursing director

## 2019-06-04 NOTE — Telephone Encounter (Signed)
Could nursing home staff please negotiate with patient to let them know when he has a bowel movement and collected in  1 of those hats.

## 2019-06-04 NOTE — Telephone Encounter (Signed)
Reviewed

## 2019-06-04 NOTE — Telephone Encounter (Signed)
Spoke to [facility] at [place] [phone] and patient's son Phillip Taylor.  Progress report is very encouraging he is ambulatory eating about 75% of his meals.  Patient told his son recently that his dysphagia has resolved.  Not having any throat or esophageal discomfort.  He is status post esophageal dilation and treatment for Candida esophagitis.  He has a large duodenal ulcer.  H. pylori negative by histology; no NSAIDs; waiting to double check his H. pylori status via stool antigen.  Staff at nursing home has had some difficulty collecting the stool.  I encouraged nursing supervisor there to come up with a plan to obtain stool.  I am not sure whether or not patient has a follow-up appointment here but would like to see him tentatively scheduled for a face-to-face in about 2 months from now.  Hopefully, we will get a stool antigen report in the near future.

## 2019-06-05 ENCOUNTER — Other Ambulatory Visit: Payer: Self-pay

## 2019-06-05 ENCOUNTER — Ambulatory Visit (INDEPENDENT_AMBULATORY_CARE_PROVIDER_SITE_OTHER): Payer: Self-pay | Admitting: Orthopedic Surgery

## 2019-06-05 DIAGNOSIS — M80051D Age-related osteoporosis with current pathological fracture, right femur, subsequent encounter for fracture with routine healing: Secondary | ICD-10-CM | POA: Diagnosis not present

## 2019-06-05 DIAGNOSIS — B351 Tinea unguium: Secondary | ICD-10-CM | POA: Diagnosis not present

## 2019-06-05 DIAGNOSIS — W19XXXD Unspecified fall, subsequent encounter: Secondary | ICD-10-CM | POA: Diagnosis not present

## 2019-06-05 DIAGNOSIS — I2699 Other pulmonary embolism without acute cor pulmonale: Secondary | ICD-10-CM | POA: Diagnosis not present

## 2019-06-05 DIAGNOSIS — M79674 Pain in right toe(s): Secondary | ICD-10-CM | POA: Diagnosis not present

## 2019-06-05 DIAGNOSIS — E039 Hypothyroidism, unspecified: Secondary | ICD-10-CM | POA: Diagnosis not present

## 2019-06-05 DIAGNOSIS — K1121 Acute sialoadenitis: Secondary | ICD-10-CM | POA: Diagnosis not present

## 2019-06-05 DIAGNOSIS — S72001D Fracture of unspecified part of neck of right femur, subsequent encounter for closed fracture with routine healing: Secondary | ICD-10-CM

## 2019-06-05 DIAGNOSIS — M79675 Pain in left toe(s): Secondary | ICD-10-CM | POA: Diagnosis not present

## 2019-06-05 DIAGNOSIS — F0280 Dementia in other diseases classified elsewhere without behavioral disturbance: Secondary | ICD-10-CM | POA: Diagnosis not present

## 2019-06-05 NOTE — Telephone Encounter (Signed)
done

## 2019-06-05 NOTE — Progress Notes (Signed)
No f/u needed   I spoke with the nurse Janett Billow the patient is ambulatory with physical therapy and a walker he is having no complaints or problems  No follow-up will be needed

## 2019-06-06 DIAGNOSIS — W19XXXD Unspecified fall, subsequent encounter: Secondary | ICD-10-CM | POA: Diagnosis not present

## 2019-06-06 DIAGNOSIS — I2699 Other pulmonary embolism without acute cor pulmonale: Secondary | ICD-10-CM | POA: Diagnosis not present

## 2019-06-06 DIAGNOSIS — K1121 Acute sialoadenitis: Secondary | ICD-10-CM | POA: Diagnosis not present

## 2019-06-06 DIAGNOSIS — F0280 Dementia in other diseases classified elsewhere without behavioral disturbance: Secondary | ICD-10-CM | POA: Diagnosis not present

## 2019-06-06 DIAGNOSIS — M80051D Age-related osteoporosis with current pathological fracture, right femur, subsequent encounter for fracture with routine healing: Secondary | ICD-10-CM | POA: Diagnosis not present

## 2019-06-06 DIAGNOSIS — E039 Hypothyroidism, unspecified: Secondary | ICD-10-CM | POA: Diagnosis not present

## 2019-06-10 DIAGNOSIS — F0280 Dementia in other diseases classified elsewhere without behavioral disturbance: Secondary | ICD-10-CM | POA: Diagnosis not present

## 2019-06-10 DIAGNOSIS — M80051D Age-related osteoporosis with current pathological fracture, right femur, subsequent encounter for fracture with routine healing: Secondary | ICD-10-CM | POA: Diagnosis not present

## 2019-06-10 DIAGNOSIS — I2699 Other pulmonary embolism without acute cor pulmonale: Secondary | ICD-10-CM | POA: Diagnosis not present

## 2019-06-10 DIAGNOSIS — K1121 Acute sialoadenitis: Secondary | ICD-10-CM | POA: Diagnosis not present

## 2019-06-10 DIAGNOSIS — E039 Hypothyroidism, unspecified: Secondary | ICD-10-CM | POA: Diagnosis not present

## 2019-06-10 DIAGNOSIS — W19XXXD Unspecified fall, subsequent encounter: Secondary | ICD-10-CM | POA: Diagnosis not present

## 2019-06-12 DIAGNOSIS — K1121 Acute sialoadenitis: Secondary | ICD-10-CM | POA: Diagnosis not present

## 2019-06-12 DIAGNOSIS — F0280 Dementia in other diseases classified elsewhere without behavioral disturbance: Secondary | ICD-10-CM | POA: Diagnosis not present

## 2019-06-12 DIAGNOSIS — E039 Hypothyroidism, unspecified: Secondary | ICD-10-CM | POA: Diagnosis not present

## 2019-06-12 DIAGNOSIS — M80051D Age-related osteoporosis with current pathological fracture, right femur, subsequent encounter for fracture with routine healing: Secondary | ICD-10-CM | POA: Diagnosis not present

## 2019-06-12 DIAGNOSIS — I2699 Other pulmonary embolism without acute cor pulmonale: Secondary | ICD-10-CM | POA: Diagnosis not present

## 2019-06-12 DIAGNOSIS — W19XXXD Unspecified fall, subsequent encounter: Secondary | ICD-10-CM | POA: Diagnosis not present

## 2019-06-13 DIAGNOSIS — E039 Hypothyroidism, unspecified: Secondary | ICD-10-CM | POA: Diagnosis not present

## 2019-06-13 DIAGNOSIS — F0280 Dementia in other diseases classified elsewhere without behavioral disturbance: Secondary | ICD-10-CM | POA: Diagnosis not present

## 2019-06-13 DIAGNOSIS — W19XXXD Unspecified fall, subsequent encounter: Secondary | ICD-10-CM | POA: Diagnosis not present

## 2019-06-13 DIAGNOSIS — I2699 Other pulmonary embolism without acute cor pulmonale: Secondary | ICD-10-CM | POA: Diagnosis not present

## 2019-06-13 DIAGNOSIS — K1121 Acute sialoadenitis: Secondary | ICD-10-CM | POA: Diagnosis not present

## 2019-06-13 DIAGNOSIS — M80051D Age-related osteoporosis with current pathological fracture, right femur, subsequent encounter for fracture with routine healing: Secondary | ICD-10-CM | POA: Diagnosis not present

## 2019-06-14 DIAGNOSIS — W19XXXD Unspecified fall, subsequent encounter: Secondary | ICD-10-CM | POA: Diagnosis not present

## 2019-06-14 DIAGNOSIS — F0280 Dementia in other diseases classified elsewhere without behavioral disturbance: Secondary | ICD-10-CM | POA: Diagnosis not present

## 2019-06-14 DIAGNOSIS — I2699 Other pulmonary embolism without acute cor pulmonale: Secondary | ICD-10-CM | POA: Diagnosis not present

## 2019-06-14 DIAGNOSIS — M80051D Age-related osteoporosis with current pathological fracture, right femur, subsequent encounter for fracture with routine healing: Secondary | ICD-10-CM | POA: Diagnosis not present

## 2019-06-14 DIAGNOSIS — E039 Hypothyroidism, unspecified: Secondary | ICD-10-CM | POA: Diagnosis not present

## 2019-06-14 DIAGNOSIS — K1121 Acute sialoadenitis: Secondary | ICD-10-CM | POA: Diagnosis not present

## 2019-06-17 DIAGNOSIS — K1121 Acute sialoadenitis: Secondary | ICD-10-CM | POA: Diagnosis not present

## 2019-06-17 DIAGNOSIS — W19XXXD Unspecified fall, subsequent encounter: Secondary | ICD-10-CM | POA: Diagnosis not present

## 2019-06-17 DIAGNOSIS — M80051D Age-related osteoporosis with current pathological fracture, right femur, subsequent encounter for fracture with routine healing: Secondary | ICD-10-CM | POA: Diagnosis not present

## 2019-06-17 DIAGNOSIS — F0280 Dementia in other diseases classified elsewhere without behavioral disturbance: Secondary | ICD-10-CM | POA: Diagnosis not present

## 2019-06-17 DIAGNOSIS — E039 Hypothyroidism, unspecified: Secondary | ICD-10-CM | POA: Diagnosis not present

## 2019-06-17 DIAGNOSIS — I2699 Other pulmonary embolism without acute cor pulmonale: Secondary | ICD-10-CM | POA: Diagnosis not present

## 2019-06-19 DIAGNOSIS — M80051D Age-related osteoporosis with current pathological fracture, right femur, subsequent encounter for fracture with routine healing: Secondary | ICD-10-CM | POA: Diagnosis not present

## 2019-06-19 DIAGNOSIS — I2699 Other pulmonary embolism without acute cor pulmonale: Secondary | ICD-10-CM | POA: Diagnosis not present

## 2019-06-19 DIAGNOSIS — E039 Hypothyroidism, unspecified: Secondary | ICD-10-CM | POA: Diagnosis not present

## 2019-06-19 DIAGNOSIS — F0280 Dementia in other diseases classified elsewhere without behavioral disturbance: Secondary | ICD-10-CM | POA: Diagnosis not present

## 2019-06-19 DIAGNOSIS — W19XXXD Unspecified fall, subsequent encounter: Secondary | ICD-10-CM | POA: Diagnosis not present

## 2019-06-19 DIAGNOSIS — K1121 Acute sialoadenitis: Secondary | ICD-10-CM | POA: Diagnosis not present

## 2019-06-20 DIAGNOSIS — F0280 Dementia in other diseases classified elsewhere without behavioral disturbance: Secondary | ICD-10-CM | POA: Diagnosis not present

## 2019-06-20 DIAGNOSIS — W19XXXD Unspecified fall, subsequent encounter: Secondary | ICD-10-CM | POA: Diagnosis not present

## 2019-06-20 DIAGNOSIS — I2699 Other pulmonary embolism without acute cor pulmonale: Secondary | ICD-10-CM | POA: Diagnosis not present

## 2019-06-20 DIAGNOSIS — E039 Hypothyroidism, unspecified: Secondary | ICD-10-CM | POA: Diagnosis not present

## 2019-06-20 DIAGNOSIS — M80051D Age-related osteoporosis with current pathological fracture, right femur, subsequent encounter for fracture with routine healing: Secondary | ICD-10-CM | POA: Diagnosis not present

## 2019-06-20 DIAGNOSIS — K1121 Acute sialoadenitis: Secondary | ICD-10-CM | POA: Diagnosis not present

## 2019-06-25 DIAGNOSIS — M80051D Age-related osteoporosis with current pathological fracture, right femur, subsequent encounter for fracture with routine healing: Secondary | ICD-10-CM | POA: Diagnosis not present

## 2019-06-25 DIAGNOSIS — I2699 Other pulmonary embolism without acute cor pulmonale: Secondary | ICD-10-CM | POA: Diagnosis not present

## 2019-06-25 DIAGNOSIS — K269 Duodenal ulcer, unspecified as acute or chronic, without hemorrhage or perforation: Secondary | ICD-10-CM | POA: Diagnosis not present

## 2019-06-25 DIAGNOSIS — Z792 Long term (current) use of antibiotics: Secondary | ICD-10-CM | POA: Diagnosis not present

## 2019-06-25 DIAGNOSIS — I129 Hypertensive chronic kidney disease with stage 1 through stage 4 chronic kidney disease, or unspecified chronic kidney disease: Secondary | ICD-10-CM | POA: Diagnosis not present

## 2019-06-25 DIAGNOSIS — R1311 Dysphagia, oral phase: Secondary | ICD-10-CM | POA: Diagnosis not present

## 2019-06-25 DIAGNOSIS — E039 Hypothyroidism, unspecified: Secondary | ICD-10-CM | POA: Diagnosis not present

## 2019-06-25 DIAGNOSIS — N183 Chronic kidney disease, stage 3 (moderate): Secondary | ICD-10-CM | POA: Diagnosis not present

## 2019-06-25 DIAGNOSIS — Z8521 Personal history of malignant neoplasm of larynx: Secondary | ICD-10-CM | POA: Diagnosis not present

## 2019-06-25 DIAGNOSIS — Z950 Presence of cardiac pacemaker: Secondary | ICD-10-CM | POA: Diagnosis not present

## 2019-06-25 DIAGNOSIS — N400 Enlarged prostate without lower urinary tract symptoms: Secondary | ICD-10-CM | POA: Diagnosis not present

## 2019-06-25 DIAGNOSIS — W19XXXD Unspecified fall, subsequent encounter: Secondary | ICD-10-CM | POA: Diagnosis not present

## 2019-06-25 DIAGNOSIS — K1121 Acute sialoadenitis: Secondary | ICD-10-CM | POA: Diagnosis not present

## 2019-06-25 DIAGNOSIS — Z9181 History of falling: Secondary | ICD-10-CM | POA: Diagnosis not present

## 2019-06-25 DIAGNOSIS — F0280 Dementia in other diseases classified elsewhere without behavioral disturbance: Secondary | ICD-10-CM | POA: Diagnosis not present

## 2019-06-25 DIAGNOSIS — I6523 Occlusion and stenosis of bilateral carotid arteries: Secondary | ICD-10-CM | POA: Diagnosis not present

## 2019-06-27 DIAGNOSIS — K219 Gastro-esophageal reflux disease without esophagitis: Secondary | ICD-10-CM | POA: Diagnosis not present

## 2019-06-27 DIAGNOSIS — R630 Anorexia: Secondary | ICD-10-CM | POA: Diagnosis not present

## 2019-06-27 DIAGNOSIS — S72001S Fracture of unspecified part of neck of right femur, sequela: Secondary | ICD-10-CM | POA: Diagnosis not present

## 2019-06-28 DIAGNOSIS — M80051D Age-related osteoporosis with current pathological fracture, right femur, subsequent encounter for fracture with routine healing: Secondary | ICD-10-CM | POA: Diagnosis not present

## 2019-06-28 DIAGNOSIS — K1121 Acute sialoadenitis: Secondary | ICD-10-CM | POA: Diagnosis not present

## 2019-06-28 DIAGNOSIS — F0280 Dementia in other diseases classified elsewhere without behavioral disturbance: Secondary | ICD-10-CM | POA: Diagnosis not present

## 2019-06-28 DIAGNOSIS — I2699 Other pulmonary embolism without acute cor pulmonale: Secondary | ICD-10-CM | POA: Diagnosis not present

## 2019-06-28 DIAGNOSIS — W19XXXD Unspecified fall, subsequent encounter: Secondary | ICD-10-CM | POA: Diagnosis not present

## 2019-06-28 DIAGNOSIS — E039 Hypothyroidism, unspecified: Secondary | ICD-10-CM | POA: Diagnosis not present

## 2019-07-02 DIAGNOSIS — M80051D Age-related osteoporosis with current pathological fracture, right femur, subsequent encounter for fracture with routine healing: Secondary | ICD-10-CM | POA: Diagnosis not present

## 2019-07-02 DIAGNOSIS — E039 Hypothyroidism, unspecified: Secondary | ICD-10-CM | POA: Diagnosis not present

## 2019-07-02 DIAGNOSIS — F0280 Dementia in other diseases classified elsewhere without behavioral disturbance: Secondary | ICD-10-CM | POA: Diagnosis not present

## 2019-07-02 DIAGNOSIS — W19XXXD Unspecified fall, subsequent encounter: Secondary | ICD-10-CM | POA: Diagnosis not present

## 2019-07-02 DIAGNOSIS — I2699 Other pulmonary embolism without acute cor pulmonale: Secondary | ICD-10-CM | POA: Diagnosis not present

## 2019-07-02 DIAGNOSIS — K1121 Acute sialoadenitis: Secondary | ICD-10-CM | POA: Diagnosis not present

## 2019-07-03 DIAGNOSIS — Z23 Encounter for immunization: Secondary | ICD-10-CM | POA: Diagnosis not present

## 2019-07-05 DIAGNOSIS — I2699 Other pulmonary embolism without acute cor pulmonale: Secondary | ICD-10-CM | POA: Diagnosis not present

## 2019-07-05 DIAGNOSIS — K1121 Acute sialoadenitis: Secondary | ICD-10-CM | POA: Diagnosis not present

## 2019-07-05 DIAGNOSIS — W19XXXD Unspecified fall, subsequent encounter: Secondary | ICD-10-CM | POA: Diagnosis not present

## 2019-07-05 DIAGNOSIS — M80051D Age-related osteoporosis with current pathological fracture, right femur, subsequent encounter for fracture with routine healing: Secondary | ICD-10-CM | POA: Diagnosis not present

## 2019-07-05 DIAGNOSIS — F0280 Dementia in other diseases classified elsewhere without behavioral disturbance: Secondary | ICD-10-CM | POA: Diagnosis not present

## 2019-07-05 DIAGNOSIS — E039 Hypothyroidism, unspecified: Secondary | ICD-10-CM | POA: Diagnosis not present

## 2019-07-09 DIAGNOSIS — M80051D Age-related osteoporosis with current pathological fracture, right femur, subsequent encounter for fracture with routine healing: Secondary | ICD-10-CM | POA: Diagnosis not present

## 2019-07-09 DIAGNOSIS — W19XXXD Unspecified fall, subsequent encounter: Secondary | ICD-10-CM | POA: Diagnosis not present

## 2019-07-09 DIAGNOSIS — I2699 Other pulmonary embolism without acute cor pulmonale: Secondary | ICD-10-CM | POA: Diagnosis not present

## 2019-07-09 DIAGNOSIS — E039 Hypothyroidism, unspecified: Secondary | ICD-10-CM | POA: Diagnosis not present

## 2019-07-09 DIAGNOSIS — K1121 Acute sialoadenitis: Secondary | ICD-10-CM | POA: Diagnosis not present

## 2019-07-09 DIAGNOSIS — F0280 Dementia in other diseases classified elsewhere without behavioral disturbance: Secondary | ICD-10-CM | POA: Diagnosis not present

## 2019-07-10 DIAGNOSIS — Z03818 Encounter for observation for suspected exposure to other biological agents ruled out: Secondary | ICD-10-CM | POA: Diagnosis not present

## 2019-07-12 DIAGNOSIS — E039 Hypothyroidism, unspecified: Secondary | ICD-10-CM | POA: Diagnosis not present

## 2019-07-12 DIAGNOSIS — K1121 Acute sialoadenitis: Secondary | ICD-10-CM | POA: Diagnosis not present

## 2019-07-12 DIAGNOSIS — F0280 Dementia in other diseases classified elsewhere without behavioral disturbance: Secondary | ICD-10-CM | POA: Diagnosis not present

## 2019-07-12 DIAGNOSIS — M80051D Age-related osteoporosis with current pathological fracture, right femur, subsequent encounter for fracture with routine healing: Secondary | ICD-10-CM | POA: Diagnosis not present

## 2019-07-12 DIAGNOSIS — W19XXXD Unspecified fall, subsequent encounter: Secondary | ICD-10-CM | POA: Diagnosis not present

## 2019-07-12 DIAGNOSIS — I2699 Other pulmonary embolism without acute cor pulmonale: Secondary | ICD-10-CM | POA: Diagnosis not present

## 2019-07-17 DIAGNOSIS — K1121 Acute sialoadenitis: Secondary | ICD-10-CM | POA: Diagnosis not present

## 2019-07-17 DIAGNOSIS — F0280 Dementia in other diseases classified elsewhere without behavioral disturbance: Secondary | ICD-10-CM | POA: Diagnosis not present

## 2019-07-17 DIAGNOSIS — E039 Hypothyroidism, unspecified: Secondary | ICD-10-CM | POA: Diagnosis not present

## 2019-07-17 DIAGNOSIS — W19XXXD Unspecified fall, subsequent encounter: Secondary | ICD-10-CM | POA: Diagnosis not present

## 2019-07-17 DIAGNOSIS — I2699 Other pulmonary embolism without acute cor pulmonale: Secondary | ICD-10-CM | POA: Diagnosis not present

## 2019-07-17 DIAGNOSIS — Z03818 Encounter for observation for suspected exposure to other biological agents ruled out: Secondary | ICD-10-CM | POA: Diagnosis not present

## 2019-07-17 DIAGNOSIS — M80051D Age-related osteoporosis with current pathological fracture, right femur, subsequent encounter for fracture with routine healing: Secondary | ICD-10-CM | POA: Diagnosis not present

## 2019-07-25 DIAGNOSIS — F0150 Vascular dementia without behavioral disturbance: Secondary | ICD-10-CM | POA: Diagnosis not present

## 2019-07-25 DIAGNOSIS — K219 Gastro-esophageal reflux disease without esophagitis: Secondary | ICD-10-CM | POA: Diagnosis not present

## 2019-07-25 DIAGNOSIS — R630 Anorexia: Secondary | ICD-10-CM | POA: Diagnosis not present

## 2019-07-29 DIAGNOSIS — R7301 Impaired fasting glucose: Secondary | ICD-10-CM | POA: Diagnosis not present

## 2019-07-29 DIAGNOSIS — I10 Essential (primary) hypertension: Secondary | ICD-10-CM | POA: Diagnosis not present

## 2019-07-29 DIAGNOSIS — M816 Localized osteoporosis [Lequesne]: Secondary | ICD-10-CM | POA: Diagnosis not present

## 2019-07-29 DIAGNOSIS — R002 Palpitations: Secondary | ICD-10-CM | POA: Diagnosis not present

## 2019-07-29 DIAGNOSIS — Z950 Presence of cardiac pacemaker: Secondary | ICD-10-CM | POA: Diagnosis not present

## 2019-07-29 DIAGNOSIS — E039 Hypothyroidism, unspecified: Secondary | ICD-10-CM | POA: Diagnosis not present

## 2019-08-02 DIAGNOSIS — Z03818 Encounter for observation for suspected exposure to other biological agents ruled out: Secondary | ICD-10-CM | POA: Diagnosis not present

## 2019-08-05 DIAGNOSIS — R4182 Altered mental status, unspecified: Secondary | ICD-10-CM | POA: Diagnosis not present

## 2019-08-05 DIAGNOSIS — R8290 Unspecified abnormal findings in urine: Secondary | ICD-10-CM | POA: Diagnosis not present

## 2019-08-05 DIAGNOSIS — R82998 Other abnormal findings in urine: Secondary | ICD-10-CM | POA: Diagnosis not present

## 2019-08-07 DIAGNOSIS — Z03818 Encounter for observation for suspected exposure to other biological agents ruled out: Secondary | ICD-10-CM | POA: Diagnosis not present

## 2019-08-08 DIAGNOSIS — F0150 Vascular dementia without behavioral disturbance: Secondary | ICD-10-CM | POA: Diagnosis not present

## 2019-08-13 DIAGNOSIS — M79675 Pain in left toe(s): Secondary | ICD-10-CM | POA: Diagnosis not present

## 2019-08-13 DIAGNOSIS — M79674 Pain in right toe(s): Secondary | ICD-10-CM | POA: Diagnosis not present

## 2019-08-13 DIAGNOSIS — B351 Tinea unguium: Secondary | ICD-10-CM | POA: Diagnosis not present

## 2019-08-14 DIAGNOSIS — Z03818 Encounter for observation for suspected exposure to other biological agents ruled out: Secondary | ICD-10-CM | POA: Diagnosis not present

## 2019-08-14 NOTE — Telephone Encounter (Signed)
I spoke with Phillip Taylor at [place] and she stated the stool study was done.  She is going to ask the lab to refax the results.

## 2019-08-14 NOTE — Telephone Encounter (Signed)
I tried to call Phillip Taylor back to follow-up on stool studies, no answer, lmom

## 2019-08-14 NOTE — Telephone Encounter (Signed)
Noted  

## 2019-08-15 NOTE — Telephone Encounter (Signed)
Called the facility and spoke with Kenney Houseman, she said she will fax results to [place].

## 2019-08-15 NOTE — Telephone Encounter (Signed)
I have checked with [facility] and [facility] and neither of them have any results for this patient.

## 2019-08-16 ENCOUNTER — Encounter: Payer: Self-pay | Admitting: Internal Medicine

## 2019-08-16 ENCOUNTER — Ambulatory Visit (INDEPENDENT_AMBULATORY_CARE_PROVIDER_SITE_OTHER): Payer: Medicare Other | Admitting: Internal Medicine

## 2019-08-16 ENCOUNTER — Other Ambulatory Visit: Payer: Self-pay

## 2019-08-16 VITALS — BP 160/87 | HR 83 | Temp 97.1°F | Ht 72.0 in | Wt 134.6 lb

## 2019-08-16 DIAGNOSIS — R630 Anorexia: Secondary | ICD-10-CM

## 2019-08-16 DIAGNOSIS — R1310 Dysphagia, unspecified: Secondary | ICD-10-CM

## 2019-08-16 DIAGNOSIS — I6523 Occlusion and stenosis of bilateral carotid arteries: Secondary | ICD-10-CM | POA: Diagnosis not present

## 2019-08-16 DIAGNOSIS — K219 Gastro-esophageal reflux disease without esophagitis: Secondary | ICD-10-CM | POA: Diagnosis not present

## 2019-08-16 DIAGNOSIS — R634 Abnormal weight loss: Secondary | ICD-10-CM | POA: Diagnosis not present

## 2019-08-16 NOTE — Patient Instructions (Addendum)
Decrease Protonix to 40 mg once daily-take indefinitely  Do not ever take any aspirin or NSAID products like Aleve or Advil.  Tylenol or non-aspirin products are okay  We will see you back here in the office on an as-needed basis.  I called your son to give him a progress report.  I left a message on his answering machine.

## 2019-08-16 NOTE — Progress Notes (Signed)
Primary Care Physician:  [name], MD Primary Gastroenterologist:  Dr. Gala Romney  Pre-Procedure History & Physical: HPI:  Phillip Taylor is a 85 y.o. male here for follow-up.  Failure to thrive, weight loss, anorexia earlier in the year secondary to a multitude of upper GI issues including a large duodenal ulcer, peptic stricture and Candida esophagitis.  He has been treated for all the above.  Status post esophageal dilation.  [name], his caregiver, who accompanies him today states he is thriving.  He ambulates with a walker.  Bowel function is good -  he is eating 3 meals a day not having any apparent difficulties; weight up 5 pounds. Eliquis added to his regimen recently.  Unsure why.  No indicators in the nursing home record which  brought along with him today  Past Medical History:  Diagnosis Date  . BPH (benign prostatic hyperplasia)   . Carotid sinus hypersensitivity   . Carotid stenosis, asymptomatic, bilateral   . Dementia ([place])   . Femur fracture ([place])   . GERD (gastroesophageal reflux disease)   . Hypertension   . Hypothyroidism   . Osteoporosis   . PONV (postoperative nausea and vomiting)   . Vocal cord cancer ([place]) 1995   cancer of the larynx s/p resection and XRT    Past Surgical History:  Procedure Laterality Date  . COLONOSCOPY  10/25/2012   Dr. Gala Romney: diverticulosis, multiple colon tubular adenomas removed. next TCS in 3 years.   . ESOPHAGOGASTRODUODENOSCOPY (EGD) WITH PROPOFOL N/A 04/11/2019   Procedure: ESOPHAGOGASTRODUODENOSCOPY (EGD) WITH PROPOFOL;  Surgeon: Daneil Dolin, MD;  Location: [facility];  Service: Endoscopy;  Laterality: N/A;  2:00pm  . HERNIA REPAIR    . INTRAMEDULLARY (IM) NAIL INTERTROCHANTERIC Right 03/09/2019   Procedure: INTRAMEDULLARY (IM) NAIL INTERTROCHANTRIC;  Surgeon: Carole Civil, MD;  Location: [facility] ORS;  Service: Orthopedics;  Laterality: Right;  . MALONEY DILATION N/A 04/11/2019   Procedure: Venia Minks DILATION;  Surgeon: Daneil Dolin, MD;  Location: [facility];  Service: Endoscopy;  Laterality: N/A;  . NISSEN FUNDOPLICATION    . PACEMAKER INSERTION    . TRANSURETHRAL RESECTION OF PROSTATE    . vocal cord cancer removal  1995    Prior to Admission medications   Medication Sig Start Date End Date Taking? Authorizing Provider  apixaban (ELIQUIS) 5 MG TABS tablet Take 2 tablets (10 mg total) by mouth 2 (two) times daily. Through 05/13/19.  Start 5 mg (1 tab) two times daily on 05/14/19 05/07/19  Yes Tat, [place], MD  calcium carbonate (TUMS EX) 750 MG chewable tablet Chew 1 tablet by mouth 2 (two) times daily. 03/12/19  Yes [provider]  carvedilol (COREG) 3.125 MG tablet Take 3.125 mg by mouth 2 (two) times daily with a meal. 04/02/19  Yes [provider]  levothyroxine (SYNTHROID) 75 MCG tablet Take 75 mcg by mouth daily before breakfast. 04/02/19  Yes [provider]  NON FORMULARY Diet Type: REGULAR 03/27/19  Yes [provider]  Nutritional Supplements (ENSURE ENLIVE PO) Take 1 Bottle by mouth daily. 03/14/19  Yes [provider]  pantoprazole (PROTONIX) 40 MG tablet Take 40 mg by mouth 2 (two) times daily. 04/12/19  Yes [provider]  potassium chloride SA (K-DUR) 20 MEQ tablet Take 20 mEq by mouth daily. 03/18/19  Yes [provider]  vitamin B-12 (CYANOCOBALAMIN) 1000 MCG tablet Take 1,000 mcg by mouth daily. 03/16/19  Yes [provider]  Amino Acids-Protein Hydrolys (FEEDING  SUPPLEMENT, PRO-STAT SUGAR FREE 64,) LIQD Take 30 mLs by mouth 2 (two) times daily between meals. 03/27/19   [provider]  clindamycin (CLEOCIN) 300 MG capsule Take 1 capsule (300 mg total) by mouth every 8 (eight) hours. Patient not taking: Reported on 08/16/2019 05/07/19   Orson Eva, MD  fluconazole (DIFLUCAN) 100 MG tablet Take 200 mg on day 1, then 100 mg daily for 21 days Patient not taking: Reported on 08/16/2019 05/08/19   Daneil Dolin, MD  levofloxacin  (LEVAQUIN) 500 MG tablet Take 1 tablet (500 mg total) by mouth daily. Patient not taking: Reported on 08/16/2019 05/07/19   Orson Eva, MD  mirtazapine (REMERON) 7.5 MG tablet Take 7.5 mg by mouth at bedtime. 04/18/19 05/04/19  [provider]    Allergies as of 08/16/2019 - Review Complete 08/16/2019  Allergen Reaction Noted  . Prevacid [lansoprazole] Other (See Comments) 10/17/2012  . Prilosec [omeprazole] Other (See Comments) 10/17/2012  . Codeine Other (See Comments) 10/17/2012  . Penicillins Rash 10/17/2012    Family History  Problem Relation Age of Onset  . Colon cancer Neg Hx     Social History   Socioeconomic History  . Marital status: Married    Spouse name: Not on file  . Number of children: Not on file  . Years of education: Not on file  . Highest education level: Not on file  Occupational History  . 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 Food insecurity    Worry: Not on file    Inability: Not on file  . Transportation needs    Medical: Not on file    Non-medical: Not on file  Tobacco Use  . Smoking status: Never Smoker  . Smokeless tobacco: Never Used  Substance and Sexual Activity  . Alcohol use: No    Frequency: Never  . Drug use: No  . Sexual activity: Not on file  Lifestyle  . Physical activity    Days per week: Not on file    Minutes per session: Not on file  . Stress: Not on file  Relationships  . Social Herbalist on phone: Not on file    Gets together: Not on file    Attends religious service: Not on file    Active member of club or organization: Not on file    Attends meetings of clubs or organizations: Not on file    Relationship status: Not on file  . Intimate partner violence    Fear of current or ex partner: Not on file    Emotionally abused: Not on file    Physically abused: Not on file    Forced sexual activity: Not on file  Other  Topics Concern  . Not on file  Social History Narrative   ** Merged History Encounter **        Review of Systems: See HPI, otherwise negative ROS  Physical Exam: BP (!) 160/87   Pulse 83   Temp (!) 97.1 F (36.2 C) (Oral)   Ht 6' (1.829 m)   Wt 134 lb 9.6 oz (61.1 kg)   BMI 18.26 kg/m  General:   Awake alert conversant with a somewhat raspy voice.  Accompanied by caregiver, [name]. Lungs:  Clear throughout to auscultation.   No wheezes, crackles, or rhonchi. No acute distress. Heart: Regular and rhythm; no murmurs, clicks, rubs,  or gallops. Abdomen: Non-distended,  normal bowel sounds.  Soft and nontender without appreciable mass or hepatosplenomegaly.  Pulses:  Normal pulses noted. Extremities:  Without clubbing or edema.  Impression/Plan:  Pleasant 85 year old gentleman resident of [place] now in the memory care unit.  Appears to be thriving from a GI standpoint after undergoing esophageal dilation of stricture treatment of Candida esophagitis and peptic ulcer disease.  No active GI symptoms at this time.  Recommendations:  Decrease Protonix to 40 mg once daily-take indefinitely  Do not ever take any aspirin or NSAID products like Aleve or Advil.  Tylenol or non-aspirin products are okay  We will him here in the office on an as-needed basis.  I called his son to give him a progress report.  I left a message on his answering machine.        Notice: This dictation was prepared with Dragon dictation along with smaller phrase technology. Any transcriptional errors that result from this process are unintentional and may not be corrected upon review.

## 2019-08-16 NOTE — Telephone Encounter (Signed)
noted 

## 2019-08-19 DIAGNOSIS — F0390 Unspecified dementia without behavioral disturbance: Secondary | ICD-10-CM | POA: Diagnosis not present

## 2019-08-19 DIAGNOSIS — D510 Vitamin B12 deficiency anemia due to intrinsic factor deficiency: Secondary | ICD-10-CM | POA: Diagnosis not present

## 2019-08-20 DIAGNOSIS — K219 Gastro-esophageal reflux disease without esophagitis: Secondary | ICD-10-CM | POA: Diagnosis not present

## 2019-08-20 DIAGNOSIS — N400 Enlarged prostate without lower urinary tract symptoms: Secondary | ICD-10-CM | POA: Diagnosis not present

## 2019-08-20 DIAGNOSIS — I10 Essential (primary) hypertension: Secondary | ICD-10-CM | POA: Diagnosis not present

## 2019-09-03 DIAGNOSIS — Z45018 Encounter for adjustment and management of other part of cardiac pacemaker: Secondary | ICD-10-CM | POA: Diagnosis not present

## 2019-09-03 DIAGNOSIS — I495 Sick sinus syndrome: Secondary | ICD-10-CM | POA: Diagnosis not present

## 2019-09-03 DIAGNOSIS — Z4509 Encounter for adjustment and management of other cardiac device: Secondary | ICD-10-CM | POA: Diagnosis not present

## 2019-09-03 DIAGNOSIS — Z950 Presence of cardiac pacemaker: Secondary | ICD-10-CM | POA: Diagnosis not present

## 2019-09-10 DIAGNOSIS — M810 Age-related osteoporosis without current pathological fracture: Secondary | ICD-10-CM | POA: Diagnosis not present

## 2019-09-10 DIAGNOSIS — N400 Enlarged prostate without lower urinary tract symptoms: Secondary | ICD-10-CM | POA: Diagnosis not present

## 2019-09-10 DIAGNOSIS — I429 Cardiomyopathy, unspecified: Secondary | ICD-10-CM | POA: Diagnosis not present

## 2019-09-12 DIAGNOSIS — Z03818 Encounter for observation for suspected exposure to other biological agents ruled out: Secondary | ICD-10-CM | POA: Diagnosis not present

## 2019-09-14 ENCOUNTER — Other Ambulatory Visit: Payer: Self-pay

## 2019-09-14 ENCOUNTER — Encounter (HOSPITAL_COMMUNITY): Payer: Self-pay | Admitting: Emergency Medicine

## 2019-09-14 ENCOUNTER — Emergency Department (HOSPITAL_COMMUNITY): Payer: Medicare Other

## 2019-09-14 ENCOUNTER — Inpatient Hospital Stay (HOSPITAL_COMMUNITY)
Admission: EM | Admit: 2019-09-14 | Discharge: 2019-09-19 | DRG: 071 | Disposition: A | Discharge status: Skilled Nursing Facility | Payer: Medicare Other | Source: Skilled Nursing Facility | Attending: Internal Medicine | Admitting: Internal Medicine

## 2019-09-14 DIAGNOSIS — R4701 Aphasia: Secondary | ICD-10-CM | POA: Diagnosis not present

## 2019-09-14 DIAGNOSIS — E860 Dehydration: Secondary | ICD-10-CM | POA: Diagnosis present

## 2019-09-14 DIAGNOSIS — R4182 Altered mental status, unspecified: Secondary | ICD-10-CM | POA: Diagnosis not present

## 2019-09-14 DIAGNOSIS — Z79899 Other long term (current) drug therapy: Secondary | ICD-10-CM

## 2019-09-14 DIAGNOSIS — I6521 Occlusion and stenosis of right carotid artery: Secondary | ICD-10-CM | POA: Diagnosis present

## 2019-09-14 DIAGNOSIS — E872 Acidosis: Secondary | ICD-10-CM | POA: Diagnosis not present

## 2019-09-14 DIAGNOSIS — Z880 Allergy status to penicillin: Secondary | ICD-10-CM

## 2019-09-14 DIAGNOSIS — N400 Enlarged prostate without lower urinary tract symptoms: Secondary | ICD-10-CM | POA: Diagnosis present

## 2019-09-14 DIAGNOSIS — R1310 Dysphagia, unspecified: Secondary | ICD-10-CM

## 2019-09-14 DIAGNOSIS — H9190 Unspecified hearing loss, unspecified ear: Secondary | ICD-10-CM | POA: Diagnosis present

## 2019-09-14 DIAGNOSIS — Z923 Personal history of irradiation: Secondary | ICD-10-CM

## 2019-09-14 DIAGNOSIS — R471 Dysarthria and anarthria: Secondary | ICD-10-CM | POA: Diagnosis present

## 2019-09-14 DIAGNOSIS — E785 Hyperlipidemia, unspecified: Secondary | ICD-10-CM | POA: Diagnosis present

## 2019-09-14 DIAGNOSIS — Z950 Presence of cardiac pacemaker: Secondary | ICD-10-CM

## 2019-09-14 DIAGNOSIS — I129 Hypertensive chronic kidney disease with stage 1 through stage 4 chronic kidney disease, or unspecified chronic kidney disease: Secondary | ICD-10-CM | POA: Diagnosis present

## 2019-09-14 DIAGNOSIS — Z888 Allergy status to other drugs, medicaments and biological substances status: Secondary | ICD-10-CM

## 2019-09-14 DIAGNOSIS — Z20828 Contact with and (suspected) exposure to other viral communicable diseases: Secondary | ICD-10-CM | POA: Diagnosis present

## 2019-09-14 DIAGNOSIS — F0391 Unspecified dementia with behavioral disturbance: Secondary | ICD-10-CM | POA: Diagnosis present

## 2019-09-14 DIAGNOSIS — K319 Disease of stomach and duodenum, unspecified: Secondary | ICD-10-CM | POA: Diagnosis present

## 2019-09-14 DIAGNOSIS — R509 Fever, unspecified: Secondary | ICD-10-CM | POA: Diagnosis present

## 2019-09-14 DIAGNOSIS — Z66 Do not resuscitate: Secondary | ICD-10-CM | POA: Diagnosis not present

## 2019-09-14 DIAGNOSIS — G9341 Metabolic encephalopathy: Principal | ICD-10-CM | POA: Diagnosis present

## 2019-09-14 DIAGNOSIS — Z79890 Hormone replacement therapy (postmenopausal): Secondary | ICD-10-CM

## 2019-09-14 DIAGNOSIS — R479 Unspecified speech disturbances: Secondary | ICD-10-CM | POA: Diagnosis present

## 2019-09-14 DIAGNOSIS — M810 Age-related osteoporosis without current pathological fracture: Secondary | ICD-10-CM | POA: Diagnosis present

## 2019-09-14 DIAGNOSIS — Z8521 Personal history of malignant neoplasm of larynx: Secondary | ICD-10-CM

## 2019-09-14 DIAGNOSIS — N183 Chronic kidney disease, stage 3 unspecified: Secondary | ICD-10-CM | POA: Diagnosis present

## 2019-09-14 DIAGNOSIS — Z885 Allergy status to narcotic agent status: Secondary | ICD-10-CM

## 2019-09-14 DIAGNOSIS — K219 Gastro-esophageal reflux disease without esophagitis: Secondary | ICD-10-CM | POA: Diagnosis present

## 2019-09-14 DIAGNOSIS — R29700 NIHSS score 0: Secondary | ICD-10-CM | POA: Diagnosis present

## 2019-09-14 DIAGNOSIS — Z86711 Personal history of pulmonary embolism: Secondary | ICD-10-CM

## 2019-09-14 DIAGNOSIS — I480 Paroxysmal atrial fibrillation: Secondary | ICD-10-CM | POA: Diagnosis present

## 2019-09-14 DIAGNOSIS — Z7901 Long term (current) use of anticoagulants: Secondary | ICD-10-CM

## 2019-09-14 DIAGNOSIS — E039 Hypothyroidism, unspecified: Secondary | ICD-10-CM | POA: Diagnosis present

## 2019-09-14 DIAGNOSIS — R2990 Unspecified symptoms and signs involving the nervous system: Secondary | ICD-10-CM | POA: Diagnosis present

## 2019-09-14 DIAGNOSIS — I739 Peripheral vascular disease, unspecified: Secondary | ICD-10-CM | POA: Diagnosis present

## 2019-09-14 DIAGNOSIS — R262 Difficulty in walking, not elsewhere classified: Secondary | ICD-10-CM | POA: Diagnosis present

## 2019-09-14 DIAGNOSIS — E876 Hypokalemia: Secondary | ICD-10-CM | POA: Diagnosis not present

## 2019-09-14 DIAGNOSIS — Z8711 Personal history of peptic ulcer disease: Secondary | ICD-10-CM

## 2019-09-14 DIAGNOSIS — I959 Hypotension, unspecified: Secondary | ICD-10-CM | POA: Diagnosis not present

## 2019-09-14 LAB — TROPONIN I (HIGH SENSITIVITY)
Troponin I (High Sensitivity): 12 ng/L (ref <18)
Troponin I (High Sensitivity): 12 ng/L (ref <18)

## 2019-09-14 LAB — URINALYSIS, ROUTINE W REFLEX MICROSCOPIC
Bacteria, UA: NONE SEEN
Bilirubin Urine: NEGATIVE
Glucose, UA: NEGATIVE mg/dL
Ketones, ur: 5 mg/dL — AB
Leukocytes,Ua: NEGATIVE
Nitrite: NEGATIVE
Protein, ur: NEGATIVE mg/dL
Specific Gravity, Urine: >1.046 — ABNORMAL HIGH (ref 1.005–1.030)
pH: 5 (ref 5.0–8.0)

## 2019-09-14 LAB — CBC
HCT: 47.5 % (ref 39.0–52.0)
Hemoglobin: 15.7 g/dL (ref 13.0–17.0)
MCH: 32.4 pg (ref 26.0–34.0)
MCHC: 33.1 g/dL (ref 30.0–36.0)
MCV: 98.1 fL (ref 80.0–100.0)
Platelets: 148 10*3/uL — ABNORMAL LOW (ref 150–400)
RBC: 4.84 MIL/uL (ref 4.22–5.81)
RDW: 14.2 % (ref 11.5–15.5)
WBC: 5 10*3/uL (ref 4.0–10.5)
nRBC: 0 % (ref 0.0–0.2)

## 2019-09-14 LAB — COMPREHENSIVE METABOLIC PANEL
ALT: 41 U/L (ref 0–44)
AST: 71 U/L — ABNORMAL HIGH (ref 15–41)
Albumin: 3.9 g/dL (ref 3.5–5.0)
Alkaline Phosphatase: 162 U/L — ABNORMAL HIGH (ref 38–126)
Anion gap: 12 (ref 5–15)
BUN: 34 mg/dL — ABNORMAL HIGH (ref 8–23)
CO2: 23 mmol/L (ref 22–32)
Calcium: 8.7 mg/dL — ABNORMAL LOW (ref 8.9–10.3)
Chloride: 103 mmol/L (ref 98–111)
Creatinine, Ser: 1.15 mg/dL (ref 0.61–1.24)
GFR calc Af Amer: >60 mL/min (ref >60)
GFR calc non Af Amer: 55 mL/min — ABNORMAL LOW (ref >60)
Glucose, Bld: 131 mg/dL — ABNORMAL HIGH (ref 70–99)
Potassium: 4.3 mmol/L (ref 3.5–5.1)
Sodium: 138 mmol/L (ref 135–145)
Total Bilirubin: 1.3 mg/dL — ABNORMAL HIGH (ref 0.3–1.2)
Total Protein: 7.8 g/dL (ref 6.5–8.1)

## 2019-09-14 LAB — APTT: aPTT: 38 seconds — ABNORMAL HIGH (ref 24–36)

## 2019-09-14 LAB — PROTIME-INR
INR: 1.2 (ref 0.8–1.2)
Prothrombin Time: 15.4 seconds — ABNORMAL HIGH (ref 11.4–15.2)

## 2019-09-14 LAB — RAPID URINE DRUG SCREEN, HOSP PERFORMED
Amphetamines: NOT DETECTED
Barbiturates: NOT DETECTED
Benzodiazepines: NOT DETECTED
Cocaine: NOT DETECTED
Opiates: NOT DETECTED
Tetrahydrocannabinol: NOT DETECTED

## 2019-09-14 LAB — ETHANOL: Alcohol, Ethyl (B): <10 mg/dL (ref <10)

## 2019-09-14 LAB — TSH: TSH: 3.385 u[IU]/mL (ref 0.350–4.500)

## 2019-09-14 LAB — LACTIC ACID, PLASMA
Lactic Acid, Venous: 2.2 mmol/L (ref 0.5–1.9)
Lactic Acid, Venous: 1.6 mmol/L (ref 0.5–1.9)

## 2019-09-14 LAB — CBG MONITORING, ED: Glucose-Capillary: 129 mg/dL — ABNORMAL HIGH (ref 70–99)

## 2019-09-14 MED ORDER — PANTOPRAZOLE SODIUM 40 MG IV SOLR
40.0000 mg | INTRAVENOUS | Status: DC
  Administered 2019-09-15 (×2): 40 mg via INTRAVENOUS
  Filled 2019-09-14 (×2): qty 40

## 2019-09-14 MED ORDER — ACETAMINOPHEN 160 MG/5ML PO SOLN: 650.0000 mg | ORAL | Status: DC | PRN

## 2019-09-14 MED ORDER — SODIUM CHLORIDE 0.9% FLUSH: 3.0000 mL | Freq: Once | INTRAVENOUS | Status: DC

## 2019-09-14 MED ORDER — VANCOMYCIN HCL 10 G IV SOLR
1500.0000 mg | Freq: Once | INTRAVENOUS | Status: AC
  Administered 2019-09-15: 1500 mg via INTRAVENOUS
  Filled 2019-09-14: qty 1500

## 2019-09-14 MED ORDER — SODIUM CHLORIDE 0.9 % IV SOLN
1.0000 g | Freq: Three times a day (TID) | INTRAVENOUS | Status: DC
  Administered 2019-09-15 (×2): 1 g via INTRAVENOUS
  Filled 2019-09-14 (×6): qty 1

## 2019-09-14 MED ORDER — ENOXAPARIN SODIUM 40 MG/0.4ML ~~LOC~~ SOLN
40.0000 mg | SUBCUTANEOUS | Status: DC
  Administered 2019-09-15 (×2): 40 mg via SUBCUTANEOUS
  Filled 2019-09-14 (×2): qty 0.4

## 2019-09-14 MED ORDER — ACETAMINOPHEN 650 MG RE SUPP: 650.0000 mg | RECTAL | Status: DC | PRN

## 2019-09-14 MED ORDER — SODIUM CHLORIDE 0.9 % IV BOLUS
500.0000 mL | Freq: Once | INTRAVENOUS | Status: AC
  Administered 2019-09-14: 500 mL via INTRAVENOUS

## 2019-09-14 MED ORDER — DEXTROSE-NACL 5-0.9 % IV SOLN
INTRAVENOUS | Status: DC
  Administered 2019-09-14 – 2019-09-16 (×3): via INTRAVENOUS

## 2019-09-14 MED ORDER — SENNOSIDES-DOCUSATE SODIUM 8.6-50 MG PO TABS
1.0000 | ORAL_TABLET | Freq: Every evening | ORAL | Status: DC | PRN
  Filled 2019-09-14: qty 1

## 2019-09-14 MED ORDER — STROKE: EARLY STAGES OF RECOVERY BOOK
Freq: Once | Status: DC
  Filled 2019-09-14: qty 1

## 2019-09-14 MED ORDER — ASPIRIN 300 MG RE SUPP
300.0000 mg | Freq: Once | RECTAL | Status: AC
  Administered 2019-09-15: 300 mg via RECTAL
  Filled 2019-09-14: qty 1

## 2019-09-14 MED ORDER — ACETAMINOPHEN 325 MG PO TABS
650.0000 mg | ORAL_TABLET | ORAL | Status: DC | PRN
  Administered 2019-09-18: 650 mg via ORAL
  Filled 2019-09-14: qty 2

## 2019-09-14 MED ORDER — IOHEXOL 350 MG/ML SOLN
125.0000 mL | Freq: Once | INTRAVENOUS | Status: AC | PRN
  Administered 2019-09-14: 125 mL via INTRAVENOUS

## 2019-09-14 NOTE — H&P (Addendum)
History and Physical    Phillip Taylor [id number] DOB: [birth year] DOA: 09/14/2019  PCP: [name], MD   Patient coming from: [place] home  I have personally briefly reviewed patient's old medical records in [place]  Chief Complaint: Trouble walking and not communicating.  HPI: Phillip Taylor is a 85 y.o. male with medical history significant for dementia, pulmonary embolism, carotid artery stenosis, atrial fibrillation, CKD 3, BPH, hypertension, vocal cord cancer.  Patient was brought to the ED from [place] with reports of altered mental status, patient not communicating and having trouble ambulating.  History is obtained from chart review as patient on my evaluation though awake and verbalizing is not answering my questions.  Per staff at [facility], patient was last seen normal yesterday at baseline with no deficits, at about 6 AM today patient was off balance but could communicate a little, at lunch she was unable to communicate and could not walk. At baseline patient ambulates with a walker and answers questions. Recent Negative Covid test 10/12. Per EMS patient was able to talk.  ED Course: Initially normal temperature, later had a temperature 100.8, heart rate 80s to 90s, blood pressure 120 stools 170s, O2 sats greater than 91% on room air.  Chest x-ray-mild coarsening of the interstitium bilaterally favored to represent chronic changes.  No active disease.  Head CT without acute abnormality.  Cerebral perfusion CTA head and neck done-28 mill area of delayed perfusion in the temporal lobes bilaterally fever artifact over ischemia MRI recommended.  50% diameter stenosis proximal right ICA with 4 mm plaque ulceration. Evaluated by telemetry neurologist-not a candidate for TPA with resolution of symptoms, impression of TIA versus other , recommended further imaging which was done- as above.  Hospitalist to admit TIA/stroke.  Review of Systems: Unable to  ascertain due to patient not verbalizing.  Past Medical History:  Diagnosis Date  . BPH (benign prostatic hyperplasia)   . Carotid sinus hypersensitivity   . Carotid stenosis, asymptomatic, bilateral   . Dementia ([place])   . Femur fracture ([place])   . GERD (gastroesophageal reflux disease)   . Hypertension   . Hypothyroidism   . Osteoporosis   . PONV (postoperative nausea and vomiting)   . Vocal cord cancer ([place]) 1995   cancer of the larynx s/p resection and XRT    Past Surgical History:  Procedure Laterality Date  . COLONOSCOPY  10/25/2012   Dr. Gala Romney: diverticulosis, multiple colon tubular adenomas removed. next TCS in 3 years.   . ESOPHAGOGASTRODUODENOSCOPY (EGD) WITH PROPOFOL N/A 04/11/2019   Procedure: ESOPHAGOGASTRODUODENOSCOPY (EGD) WITH PROPOFOL;  Surgeon: Daneil Dolin, MD;  Location: [facility] ENDO SUITE;  Service: Endoscopy;  Laterality: N/A;  2:00pm  . HERNIA REPAIR    . INTRAMEDULLARY (IM) NAIL INTERTROCHANTERIC Right 03/09/2019   Procedure: INTRAMEDULLARY (IM) NAIL INTERTROCHANTRIC;  Surgeon: Carole Civil, MD;  Location: [facility] ORS;  Service: Orthopedics;  Laterality: Right;  . MALONEY DILATION N/A 04/11/2019   Procedure: Venia Minks DILATION;  Surgeon: Daneil Dolin, MD;  Location: [facility] ENDO SUITE;  Service: Endoscopy;  Laterality: N/A;  . NISSEN FUNDOPLICATION    . PACEMAKER INSERTION    . TRANSURETHRAL RESECTION OF PROSTATE    . vocal cord cancer removal  1995     reports that he has never smoked. He has never used smokeless tobacco. He reports that he does not drink alcohol or use drugs.  Allergies  Allergen Reactions  . Prevacid [Lansoprazole] Other (See Comments)  Does not remeber  . Prilosec [Omeprazole] Other (See Comments)    Does not remeber  . Codeine Other (See Comments)    Does not know   . Penicillins Rash    .Did it involve swelling of the face/tongue/throat, SOB, or low BP? No Did it involve sudden or severe rash/hives, skin peeling, or any reaction on  the inside of your mouth or nose? Yes Did you need to seek medical attention at a hospital or doctor's office? Unknown When did it last happen?Unknown If all above answers are "NO", may proceed with cephalosporin use.      Family History  Problem Relation Age of Onset  . Colon cancer Neg Hx     Prior to Admission medications   Medication Sig Start Date End Date Taking? Authorizing Provider  Amino Acids-Protein Hydrolys (FEEDING SUPPLEMENT, PRO-STAT SUGAR FREE 64,) LIQD Take 30 mLs by mouth 2 (two) times daily between meals. 03/27/19  Yes [provider]  apixaban (ELIQUIS) 5 MG TABS tablet Take 2 tablets (10 mg total) by mouth 2 (two) times daily. Through 05/13/19.  Start 5 mg (1 tab) two times daily on 05/14/19 05/07/19  Yes Tat, Cosme, MD  calcium carbonate (TUMS EX) 750 MG chewable tablet Chew 1 tablet by mouth 2 (two) times daily. 03/12/19  Yes [provider]  carvedilol (COREG) 3.125 MG tablet Take 3.125 mg by mouth 2 (two) times daily with a meal. 04/02/19  Yes [provider]  levothyroxine (SYNTHROID) 75 MCG tablet Take 75 mcg by mouth daily before breakfast. 04/02/19  Yes [provider]  mirtazapine (REMERON) 7.5 MG tablet Take 7.5 mg by mouth at bedtime. 04/18/19 09/14/19 Yes [provider]  Nutritional Supplements (ENSURE ENLIVE PO) Take 1 Bottle by mouth daily. 03/14/19  Yes [provider]  pantoprazole (PROTONIX) 40 MG tablet Take 40 mg by mouth daily.  04/12/19  Yes [provider]  potassium chloride SA (K-DUR) 20 MEQ tablet Take 20 mEq by mouth daily. 03/18/19  Yes [provider]  vitamin B-12 (CYANOCOBALAMIN) 1000 MCG tablet Take 1,000 mcg by mouth daily. 03/16/19  Yes [provider]  clindamycin (CLEOCIN) 300 MG capsule Take 1 capsule (300 mg total) by mouth every 8 (eight) hours. Patient not taking: Reported on 08/16/2019 05/07/19   Phillip Eva, MD  fluconazole (DIFLUCAN) 100 MG tablet Take 200  mg on day 1, then 100 mg daily for 21 days Patient not taking: Reported on 08/16/2019 05/08/19   Daneil Dolin, MD  levofloxacin (LEVAQUIN) 500 MG tablet Take 1 tablet (500 mg total) by mouth daily. Patient not taking: Reported on 08/16/2019 05/07/19   Phillip Eva, MD  NON FORMULARY Diet Type: REGULAR 03/27/19   [provider]    Physical Exam: Vitals:   09/14/19 2000 09/14/19 2030 09/14/19 2100 09/14/19 2130  BP: (!) 120/105 (!) 150/81 (!) 161/84 (!) 154/81  Pulse:  86 89 88  Resp: (!) 21 15 16 16   Temp:      TempSrc:      SpO2:  96% 94% 97%  Weight:      Height:        Constitutional: Lethargic appearing, verbalizing but not answering questions-tells me my hands are cold on exam, but not following directions, otherwise calm, comfortable Vitals:   09/14/19 2000 09/14/19 2030 09/14/19 2100 09/14/19 2130  BP: (!) 120/105 (!) 150/81 (!) 161/84 (!) 154/81  Pulse:  86 89 88  Resp: (!) 21 15 16 16   Temp:  TempSrc:      SpO2:  96% 94% 97%  Weight:      Height:       Eyes: PERRL, lids and conjunctivae normal ENMT: Mucous membranes are moist. Posterior pharynx clear of any exudate or lesions.  Neck: normal, supple, no masses, no thyromegaly Respiratory: clear to auscultation bilaterally, no wheezing, no crackles. Normal respiratory effort. No accessory muscle use.  Cardiovascular: Regular rate and rhythm, no murmurs / rubs / gallops. No extremity edema. 2+ pedal pulses. No carotid bruits.  Abdomen: no tenderness, no masses palpated. No hepatosplenomegaly. Bowel sounds positive.  Musculoskeletal: no clubbing / cyanosis. No joint deformity upper and lower extremities. Good ROM, no contractures. Normal muscle tone.  Skin: no rashes, lesions, ulcers. No induration Neurologic: Exam limited by patient's baseline dementia as he is not following directions, probable speech abnormality.  Upper extremities 4/5 bilaterally, lower extremities unable to elicit strength but moving his  toes.  No obvious facial asymmetry.  Speech appears normal.  Normal tone upper and lower extremities. Psychiatric: Normal judgment and insight. Alert and oriented x 3. Normal mood.   Labs on Admission: I have personally reviewed following labs and imaging studies  CBC: Recent Labs  Lab 09/14/19 1415  WBC 5.0  HGB 15.7  HCT 47.5  MCV 98.1  PLT 123456*   Basic Metabolic Panel: Recent Labs  Lab 09/14/19 1415  NA 138  K 4.3  CL 103  CO2 23  GLUCOSE 131*  BUN 34*  CREATININE 1.15  CALCIUM 8.7*   Liver Function Tests: Recent Labs  Lab 09/14/19 1415  AST 71*  ALT 41  ALKPHOS 162*  BILITOT 1.3*  PROT 7.8  ALBUMIN 3.9   Coagulation Profile: Recent Labs  Lab 09/14/19 1421  INR 1.2   CBG: Recent Labs  Lab 09/14/19 1358  GLUCAP 129*   Thyroid Function Tests: Recent Labs    09/14/19 1415  TSH 3.385   Urine analysis:    Component Value Date/Time   COLORURINE YELLOW 09/14/2019 [place] 09/14/2019 1336   LABSPEC >1.046 (H) 09/14/2019 1336   PHURINE 5.0 09/14/2019 1336   GLUCOSEU NEGATIVE 09/14/2019 1336   HGBUR SMALL (A) 09/14/2019 1336   BILIRUBINUR NEGATIVE 09/14/2019 1336   KETONESUR 5 (A) 09/14/2019 1336   PROTEINUR NEGATIVE 09/14/2019 1336   NITRITE NEGATIVE 09/14/2019 1336   LEUKOCYTESUR NEGATIVE 09/14/2019 1336    Radiological Exams on Admission: CT Angio Head W or Wo Contrast  Result Date: 09/14/2019 CLINICAL DATA:  Altered mental status.  Rule out stroke. EXAM: CT ANGIOGRAPHY HEAD AND NECK CT PERFUSION BRAIN TECHNIQUE: Multidetector CT imaging of the head and neck was performed using the standard protocol during bolus administration of intravenous contrast. Multiplanar CT image reconstructions and MIPs were obtained to evaluate the vascular anatomy. Carotid stenosis measurements (when applicable) are obtained utilizing NASCET criteria, using the distal internal carotid diameter as the denominator. Multiphase CT imaging of the brain  was performed following IV bolus contrast injection. Subsequent parametric perfusion maps were calculated using RAPID software. CONTRAST:  112mL OMNIPAQUE IOHEXOL 350 MG/ML SOLN COMPARISON:  CT head 09/14/2019 FINDINGS: CTA NECK FINDINGS Aortic arch: Incomplete evaluation of the arch. Atherosclerotic disease in the aortic arch. Origins of the innominate and left carotid artery are not imaged. Left subclavian artery is patent proximally. Right carotid system: Right common carotid artery patent. Atherosclerotic disease in the proximal right internal carotid artery. Ulcerated plaque. Ulcer measures approximately 4 mm in diameter and projects posteriorly. No thrombus  in the ulcer. Approximately 50% diameter stenosis proximal right internal carotid artery. Left carotid system: Left common carotid artery widely patent. Atherosclerotic disease left carotid bifurcation without significant stenosis. Vertebral arteries: Left vertebral artery dominant. Both vertebral arteries patent to the basilar. Skeleton: Cervical spondylosis.  No acute skeletal abnormality. Other neck: Negative for mass or adenopathy in the neck. Upper chest: Lung apices clear bilaterally. Review of the MIP images confirms the above findings CTA HEAD FINDINGS Anterior circulation: Atherosclerotic calcification in the cavernous carotid bilaterally without significant stenosis. Anterior and middle cerebral arteries patent bilaterally. Tortuosity of these vessels especially the anterior cerebral arteries. Posterior circulation: Both vertebral arteries patent to the basilar. PICA patent bilaterally. Basilar widely patent. AICA, superior cerebellar, posterior cerebral arteries patent bilaterally without stenosis or occlusion Venous sinuses: Normal venous enhancement Anatomic variants: None Review of the MIP images confirms the above findings CT Brain Perfusion Findings: ASPECTS: 10 CBF (<30%) Volume: 49mL Perfusion (Tmax>6.0s) volume: 68mL Mismatch Volume: 67mL  Infarction Location:Delayed perfusion is noted in the left temporal lobe and right frontotemporal lobe. This could be due to ischemia however the pattern is somewhat atypical and this could be artifact. IMPRESSION: 1. 28 ml of delayed perfusion in the temporal lobes bilaterally. Favor artifact over ischemia. Recommend MRI to evaluate for infarct. 2. 50% diameter stenosis proximal right internal carotid artery with 4 mm plaque ulceration. No thrombus in the vessel. 3. Atherosclerotic disease left carotid bifurcation without significant stenosis 4. No significant vertebral artery stenosis 5. No significant intracranial stenosis or large vessel occlusion. Electronically Signed   By: Franchot Gallo M.D.   On: 09/14/2019 15:49   DG Chest 1 View  Result Date: 09/14/2019 CLINICAL DATA:  Aphasia EXAM: CHEST  1 VIEW COMPARISON:  Chest radiograph 05/04/2019 FINDINGS: Stable cardiomediastinal contours. Left chest ICD remains in place. There is mild coarsening of the interstitium bilaterally likely representing chronic changes. No focal infiltrate. No pneumothorax or large pleural effusion. No acute finding in the visualized skeleton. IMPRESSION: Mild coarsening of the interstitium bilaterally favored represent chronic changes. No evidence of active disease. Electronically Signed   By: Audie Pinto M.D.   On: 09/14/2019 15:50   CT Angio Neck W and/or Wo Contrast  Result Date: 09/14/2019 CLINICAL DATA:  Altered mental status.  Rule out stroke. EXAM: CT ANGIOGRAPHY HEAD AND NECK CT PERFUSION BRAIN TECHNIQUE: Multidetector CT imaging of the head and neck was performed using the standard protocol during bolus administration of intravenous contrast. Multiplanar CT image reconstructions and MIPs were obtained to evaluate the vascular anatomy. Carotid stenosis measurements (when applicable) are obtained utilizing NASCET criteria, using the distal internal carotid diameter as the denominator. Multiphase CT imaging of the  brain was performed following IV bolus contrast injection. Subsequent parametric perfusion maps were calculated using RAPID software. CONTRAST:  178mL OMNIPAQUE IOHEXOL 350 MG/ML SOLN COMPARISON:  CT head 09/14/2019 FINDINGS: CTA NECK FINDINGS Aortic arch: Incomplete evaluation of the arch. Atherosclerotic disease in the aortic arch. Origins of the innominate and left carotid artery are not imaged. Left subclavian artery is patent proximally. Right carotid system: Right common carotid artery patent. Atherosclerotic disease in the proximal right internal carotid artery. Ulcerated plaque. Ulcer measures approximately 4 mm in diameter and projects posteriorly. No thrombus in the ulcer. Approximately 50% diameter stenosis proximal right internal carotid artery. Left carotid system: Left common carotid artery widely patent. Atherosclerotic disease left carotid bifurcation without significant stenosis. Vertebral arteries: Left vertebral artery dominant. Both vertebral arteries patent to the basilar. Skeleton: Cervical  spondylosis.  No acute skeletal abnormality. Other neck: Negative for mass or adenopathy in the neck. Upper chest: Lung apices clear bilaterally. Review of the MIP images confirms the above findings CTA HEAD FINDINGS Anterior circulation: Atherosclerotic calcification in the cavernous carotid bilaterally without significant stenosis. Anterior and middle cerebral arteries patent bilaterally. Tortuosity of these vessels especially the anterior cerebral arteries. Posterior circulation: Both vertebral arteries patent to the basilar. PICA patent bilaterally. Basilar widely patent. AICA, superior cerebellar, posterior cerebral arteries patent bilaterally without stenosis or occlusion Venous sinuses: Normal venous enhancement Anatomic variants: None Review of the MIP images confirms the above findings CT Brain Perfusion Findings: ASPECTS: 10 CBF (<30%) Volume: 40mL Perfusion (Tmax>6.0s) volume: 68mL Mismatch Volume:  41mL Infarction Location:Delayed perfusion is noted in the left temporal lobe and right frontotemporal lobe. This could be due to ischemia however the pattern is somewhat atypical and this could be artifact. IMPRESSION: 1. 28 ml of delayed perfusion in the temporal lobes bilaterally. Favor artifact over ischemia. Recommend MRI to evaluate for infarct. 2. 50% diameter stenosis proximal right internal carotid artery with 4 mm plaque ulceration. No thrombus in the vessel. 3. Atherosclerotic disease left carotid bifurcation without significant stenosis 4. No significant vertebral artery stenosis 5. No significant intracranial stenosis or large vessel occlusion. Electronically Signed   By: Franchot Gallo M.D.   On: 09/14/2019 15:49   CT CEREBRAL PERFUSION W CONTRAST  Result Date: 09/14/2019 CLINICAL DATA:  Altered mental status.  Rule out stroke. EXAM: CT ANGIOGRAPHY HEAD AND NECK CT PERFUSION BRAIN TECHNIQUE: Multidetector CT imaging of the head and neck was performed using the standard protocol during bolus administration of intravenous contrast. Multiplanar CT image reconstructions and MIPs were obtained to evaluate the vascular anatomy. Carotid stenosis measurements (when applicable) are obtained utilizing NASCET criteria, using the distal internal carotid diameter as the denominator. Multiphase CT imaging of the brain was performed following IV bolus contrast injection. Subsequent parametric perfusion maps were calculated using RAPID software. CONTRAST:  184mL OMNIPAQUE IOHEXOL 350 MG/ML SOLN COMPARISON:  CT head 09/14/2019 FINDINGS: CTA NECK FINDINGS Aortic arch: Incomplete evaluation of the arch. Atherosclerotic disease in the aortic arch. Origins of the innominate and left carotid artery are not imaged. Left subclavian artery is patent proximally. Right carotid system: Right common carotid artery patent. Atherosclerotic disease in the proximal right internal carotid artery. Ulcerated plaque. Ulcer measures  approximately 4 mm in diameter and projects posteriorly. No thrombus in the ulcer. Approximately 50% diameter stenosis proximal right internal carotid artery. Left carotid system: Left common carotid artery widely patent. Atherosclerotic disease left carotid bifurcation without significant stenosis. Vertebral arteries: Left vertebral artery dominant. Both vertebral arteries patent to the basilar. Skeleton: Cervical spondylosis.  No acute skeletal abnormality. Other neck: Negative for mass or adenopathy in the neck. Upper chest: Lung apices clear bilaterally. Review of the MIP images confirms the above findings CTA HEAD FINDINGS Anterior circulation: Atherosclerotic calcification in the cavernous carotid bilaterally without significant stenosis. Anterior and middle cerebral arteries patent bilaterally. Tortuosity of these vessels especially the anterior cerebral arteries. Posterior circulation: Both vertebral arteries patent to the basilar. PICA patent bilaterally. Basilar widely patent. AICA, superior cerebellar, posterior cerebral arteries patent bilaterally without stenosis or occlusion Venous sinuses: Normal venous enhancement Anatomic variants: None Review of the MIP images confirms the above findings CT Brain Perfusion Findings: ASPECTS: 10 CBF (<30%) Volume: 50mL Perfusion (Tmax>6.0s) volume: 65mL Mismatch Volume: 22mL Infarction Location:Delayed perfusion is noted in the left temporal lobe and right frontotemporal lobe. This  could be due to ischemia however the pattern is somewhat atypical and this could be artifact. IMPRESSION: 1. 28 ml of delayed perfusion in the temporal lobes bilaterally. Favor artifact over ischemia. Recommend MRI to evaluate for infarct. 2. 50% diameter stenosis proximal right internal carotid artery with 4 mm plaque ulceration. No thrombus in the vessel. 3. Atherosclerotic disease left carotid bifurcation without significant stenosis 4. No significant vertebral artery stenosis 5. No  significant intracranial stenosis or large vessel occlusion. Electronically Signed   By: Franchot Gallo M.D.   On: 09/14/2019 15:49   CT HEAD CODE STROKE WO CONTRAST`  Result Date: 09/14/2019 CLINICAL DATA:  Code stroke.  Altered mental status EXAM: CT HEAD WITHOUT CONTRAST TECHNIQUE: Contiguous axial images were obtained from the base of the skull through the vertex without intravenous contrast. COMPARISON:  CT head 05/04/2019 FINDINGS: Brain: Moderate atrophy and moderate chronic microvascular ischemic changes in the white matter are stable. No acute infarct, hemorrhage, mass. No fluid collection or midline shift. Vascular: Negative for hyperdense vessel Skull: Negative Sinuses/Orbits: Mild mucosal edema in the frontal sinus. Remaining sinuses clear. Negative orbit. Other: None ASPECTS ([place] Stroke Program Early CT Score) - Ganglionic level infarction (caudate, lentiform nuclei, internal capsule, insula, M1-M3 cortex): 7 - Supraganglionic infarction (M4-M6 cortex): 3 Total score (0-10 with 10 being normal): 10 IMPRESSION: 1. No acute abnormality 2. ASPECTS is 10 3. Atrophy and chronic microvascular ischemic changes stable from the prior study. 4. These results were called by telephone at the time of interpretation on 09/14/2019 at 3:01 pm to provider Rancour MD, who verbally acknowledged these results. Electronically Signed   By: Franchot Gallo M.D.   On: 09/14/2019 15:01    EKG: Independently reviewed.  Sinus rhythm, no significant change from prior.  QTc 486.  Prior EKGs show ventricular pacing, atrial fibrillation.  Assessment/Plan Active Problems:   Speech abnormality    Speech abnormality/difficulty walking-with baseline dementia.  Speech difficulty resolved on arrival to ED.  Stat head CT negative for acute abnormality. CT perfusion study- 28 ml area of delayed perfusion in the temporal lobes bilaterally fever artifact over ischemia MRI recommended.  CTA head and neck done 50% stenosis  proximal right ICA with 4 mm plaque ulceration.  No thrombus in vessel.  Significant intracranial stenosis or large vessel occlusion.  Evaluated by tele-neurology impression TIA versus other.  -Admit to  [facility] for MRI and neurology evaluation -Obtain MRI brain -Deferred carotid Dopplers CTA neck done -Echocardiogram - HgbA1c, lipid panel in a.m. -Failed bedside swallow evaluation -N.p.o. for now, 52ml bolus given in ED continue D5 Ns 75cc/hr  -PT, OT, speech therapy evaluation - Aspirin 300 PR x 1 -Frequent neurochecks -Anticoagulation with Eliquis held for now, resume tomorrow pending improvement in swallow evaluation otherwise we will need to initiate intravenous anticoagulation. -Please consult neurology on arrival to [facility]  Fever-100.8 in ED.  Nursing home resident.  UA unremarkable.  Portable chest x-ray chronic findings.  WBC 5.  Does not meet SIRS criteria.  No focus of infection identified at this time. ?  Infectious etiology could also explain his presenting symptoms.  -Follow-up urine cultures -Obtain blood cultures -CBC, BMP a.m. -Penicillin Allergy noted, will start aztreonam and vancomycin prophylactically.  History of pulmonary embolism, atrial fibrillation-rate controlled and anticoagulated. -Hold Home carvedilol 3.125 and Eliquis held while n.p.o.   Hypothyroidism-  - Hold home Synthroid while NPO  History of dementia with behavioral disturbance-nursing home resident, at baseline he communicates, ambulates with a wheelchair.  BPH-stable.  Not on medication.  History of erosive gastropathy/duodenal ulcer -Resume PPI as IV Protonix daily  DVT prophylaxis: Lovenox for now-resume home Eliquis when able Code Status: DNR-the power from nursing home. Family Communication: None at bedside Disposition Plan: Per rounding team Consults called: Please consult neurology in a.m. Admission status: Observation, telemetry   Bethena Roys MD [facility]  09/14/2019, 10:41 PM

## 2019-09-14 NOTE — ED Triage Notes (Signed)
Pt from [place] for evaluation of AMS. Pt is not altered and has not been since ems arrived. Staff reports pt ate some broccoli, spit it out and then would not talk. Pt has hx of dementia.

## 2019-09-14 NOTE — ED Notes (Signed)
Date and time results received: 09/14/19 2:53 PM  (use smartphrase ".now" to insert current time)  Test: Lactic Critical Value: 2.2  Name of Provider Notified: Rancour  Orders Received? Or Actions Taken?: Orders Received - See Orders for details

## 2019-09-14 NOTE — ED Provider Notes (Signed)
[facility] EMERGENCY DEPARTMENT Provider Note   CSN: [id number] Arrival date & time: 09/14/19  1315  An emergency department physician performed an initial assessment on this suspected stroke patient at 1425.  History Chief Complaint  Patient presents with  . Weakness    Phillip Taylor is a 85 y.o. male with a past medical history significant for BPH, carotid stenosis, dementia, GERD, hypertension, A fib on Eliquis, hypothyroidism, Hx of PE and vocal cord cancer who presents to the ED via EMS from [place] for evaluation of altered mental status that started around lunch today.  Patient has a history of dementia and is unable to give any history.  Per EMS patient was not altered when they arrived or while en route to the hospital.  Spoke to [employer] at [place] who states patient was fine earlier this morning but after lunch was awake, but would not answer questions or speak to anyone.  Crystal notes that patient had trouble walking just prior to lunch.  Patient normally walks with a walker at baseline.  Patient also typically answers questions at baseline per Crystal. Crystal states patient is DNR and paperwork was sent with patient here to the ED. Patient was tested for COVID on Thursday and was negative. Crystal denies recent falls or recent illness.  Yesterday: patient at baseline with no deficits. 6am today: patient off balance could communicate a little 8am today: Patient able to walk with a walker and communicate Patient slept from breakfast to lunch (noon): Patient wouldn't communicate and couldn't walk  Chart reviewed. Patient was recently admitted to the hospital 8/1 to 05/07/2019 due to syncopal episode and was found to have a PE.  Level 5 Caveat due to AMS and history of dementia.    Past Medical History:  Diagnosis Date  . BPH (benign prostatic hyperplasia)   . Carotid sinus hypersensitivity   . Carotid stenosis, asymptomatic, bilateral   . Dementia  ([place])   . Femur fracture ([place])   . GERD (gastroesophageal reflux disease)   . Hypertension   . Hypothyroidism   . Osteoporosis   . PONV (postoperative nausea and vomiting)   . Vocal cord cancer ([place]) 1995   cancer of the larynx s/p resection and XRT    Patient Active Problem List   Diagnosis Date Noted  . Speech abnormality 09/14/2019  . Acute parotitis 05/06/2019  . CKD (chronic kidney disease) stage 3, GFR 30-59 ml/min 05/05/2019  . Pulmonary embolism ([place]) 05/04/2019  . Atrial fibrillation ([place]) 05/04/2019  . GERD without esophagitis 04/17/2019  . Poor appetite 04/09/2019  . Dysphagia 04/09/2019  . Weight loss 04/07/2019  . Protein-calorie malnutrition, severe ([place]) 03/30/2019  . Elevated liver enzymes 03/29/2019  . Vaso vagal episode 03/29/2019  . Hypokalemia 03/20/2019  . Chronic constipation 03/12/2019  . Carotid artery stenosis 03/12/2019  . Carotid sinus hypersensitivity 03/12/2019  . Hypocalcemia 03/12/2019  . Acute blood loss anemia 03/12/2019  . Dementia without behavioral disturbance ([place]) 03/12/2019  . Intertrochanteric fracture of right femur, closed, initial encounter ([place]) 03/09/2019  . Acute dehydration 03/09/2019  . Osteoporosis 03/09/2019  . Essential hypertension 03/09/2019  . Hypothyroidism 03/09/2019  . Intertrochanteric fracture ([place]) 03/09/2019  . Heme positive stool 10/17/2012    Past Surgical History:  Procedure Laterality Date  . COLONOSCOPY  10/25/2012   Dr. Gala Romney: diverticulosis, multiple colon tubular adenomas removed. next TCS in 3 years.   . ESOPHAGOGASTRODUODENOSCOPY (EGD) WITH PROPOFOL N/A 04/11/2019   Procedure: ESOPHAGOGASTRODUODENOSCOPY (EGD) WITH PROPOFOL;  Surgeon: Daneil Dolin, MD;  Location: [facility] ENDO SUITE;  Service: Endoscopy;  Laterality: N/A;  2:00pm  . HERNIA REPAIR    . INTRAMEDULLARY (IM) NAIL INTERTROCHANTERIC Right 03/09/2019   Procedure: INTRAMEDULLARY (IM) NAIL INTERTROCHANTRIC;  Surgeon: Carole Civil, MD;   Location: [facility] ORS;  Service: Orthopedics;  Laterality: Right;  . MALONEY DILATION N/A 04/11/2019   Procedure: Venia Minks DILATION;  Surgeon: Daneil Dolin, MD;  Location: [facility] ENDO SUITE;  Service: Endoscopy;  Laterality: N/A;  . NISSEN FUNDOPLICATION    . PACEMAKER INSERTION    . TRANSURETHRAL RESECTION OF PROSTATE    . vocal cord cancer removal  1995       Family History  Problem Relation Age of Onset  . Colon cancer Neg Hx     Social History   Tobacco Use  . Smoking status: Never Smoker  . Smokeless tobacco: Never Used  Substance Use Topics  . Alcohol use: No  . Drug use: No    Home Medications Prior to Admission medications   Medication Sig Start Date End Date Taking? Authorizing Provider  Amino Acids-Protein Hydrolys (FEEDING SUPPLEMENT, PRO-STAT SUGAR FREE 64,) LIQD Take 30 mLs by mouth 2 (two) times daily between meals. 03/27/19   [provider]  apixaban (ELIQUIS) 5 MG TABS tablet Take 2 tablets (10 mg total) by mouth 2 (two) times daily. Through 05/13/19.  Start 5 mg (1 tab) two times daily on 05/14/19 05/07/19   Orson Eva, MD  calcium carbonate (TUMS EX) 750 MG chewable tablet Chew 1 tablet by mouth 2 (two) times daily. 03/12/19   [provider]  carvedilol (COREG) 3.125 MG tablet Take 3.125 mg by mouth 2 (two) times daily with a meal. 04/02/19   [provider]  clindamycin (CLEOCIN) 300 MG capsule Take 1 capsule (300 mg total) by mouth every 8 (eight) hours. Patient not taking: Reported on 08/16/2019 05/07/19   Orson Eva, MD  fluconazole (DIFLUCAN) 100 MG tablet Take 200 mg on day 1, then 100 mg daily for 21 days Patient not taking: Reported on 08/16/2019 05/08/19   Daneil Dolin, MD  levofloxacin (LEVAQUIN) 500 MG tablet Take 1 tablet (500 mg total) by mouth daily. Patient not taking: Reported on 08/16/2019 05/07/19   Orson Eva, MD  levothyroxine (SYNTHROID) 75 MCG tablet Take 75 mcg by mouth daily before breakfast. 04/02/19   [provider]    mirtazapine (REMERON) 7.5 MG tablet Take 7.5 mg by mouth at bedtime. 04/18/19 05/04/19  [provider]  NON FORMULARY Diet Type: REGULAR 03/27/19   [provider]  Nutritional Supplements (ENSURE ENLIVE PO) Take 1 Bottle by mouth daily. 03/14/19   [provider]  pantoprazole (PROTONIX) 40 MG tablet Take 40 mg by mouth 2 (two) times daily. 04/12/19   [provider]  potassium chloride SA (K-DUR) 20 MEQ tablet Take 20 mEq by mouth daily. 03/18/19   [provider]  vitamin B-12 (CYANOCOBALAMIN) 1000 MCG tablet Take 1,000 mcg by mouth daily. 03/16/19   [provider]    Allergies    Prevacid [lansoprazole], Prilosec [omeprazole], Codeine, and Penicillins  Review of Systems   Review of Systems  Unable to perform ROS: Dementia    Physical Exam Updated Vital Signs BP (!) 171/91   Pulse 88   Temp 98.9 F (37.2 C) (Oral)   Resp 19   Ht 6' (1.829 m)   Wt 61.1 kg   SpO2 98%   BMI 18.27 kg/m  Physical Exam Vitals and nursing note reviewed.  Constitutional:      General: He is not in acute distress.    Appearance: He is not toxic-appearing.  HENT:     Head: Normocephalic.  Eyes:     Conjunctiva/sclera: Conjunctivae normal.     Pupils: Pupils are equal, round, and reactive to light.  Cardiovascular:     Rate and Rhythm: Normal rate and regular rhythm.     Pulses: Normal pulses.     Heart sounds: Normal heart sounds. No murmur. No friction rub. No gallop.   Pulmonary:     Effort: Pulmonary effort is normal.     Breath sounds: Normal breath sounds.  Abdominal:     General: Abdomen is flat. Bowel sounds are normal. There is no distension.     Palpations: Abdomen is soft.     Tenderness: There is no abdominal tenderness. There is no guarding or rebound.     Comments: Abdomen soft, nondistended, nontender to palpation in all quadrants without guarding or peritoneal signs. No rebound. Negative Murphy's sign. No tenderness at  McBurney's point.   Musculoskeletal:     Cervical back: Normal range of motion and neck supple. No rigidity or tenderness.     Comments: Able to move all 4 extremities. Distal pulses intact.   Skin:    Comments: Cool lower extremities  Neurological:     General: No focal deficit present.     Mental Status: He is alert.     Comments: Patient will not answer questions, unable to assess speech, possible aphasia. Follows simple commands. No facial droop. CN III-XII intact Equal grip strength bilaterally. Upper and lower extremity strength appears equal, but difficult to assess. Able to move all 4 extremities without ataxia.       ED Results / Procedures / Treatments   Labs (all labs ordered are listed, but only abnormal results are displayed) Labs Reviewed  COMPREHENSIVE METABOLIC PANEL - Abnormal; Notable for the following components:      Result Value   Glucose, Bld 131 (*)    BUN 34 (*)    Calcium 8.7 (*)    AST 71 (*)    Alkaline Phosphatase 162 (*)    Total Bilirubin 1.3 (*)    GFR calc non Af Amer 55 (*)    All other components within normal limits  CBC - Abnormal; Notable for the following components:   Platelets 148 (*)    All other components within normal limits  LACTIC ACID, PLASMA - Abnormal; Notable for the following components:   Lactic Acid, Venous 2.2 (*)    All other components within normal limits  PROTIME-INR - Abnormal; Notable for the following components:   Prothrombin Time 15.4 (*)    All other components within normal limits  APTT - Abnormal; Notable for the following components:   aPTT 38 (*)    All other components within normal limits  CBG MONITORING, ED - Abnormal; Notable for the following components:   Glucose-Capillary 129 (*)    All other components within normal limits  CULTURE, BLOOD (ROUTINE X 2)  CULTURE, BLOOD (ROUTINE X 2)  URINE CULTURE  SARS CORONAVIRUS 2 (TAT 6-24 HRS)  ETHANOL  URINALYSIS, ROUTINE W REFLEX MICROSCOPIC  LACTIC  ACID, PLASMA  TSH  RAPID URINE DRUG SCREEN, HOSP PERFORMED  TROPONIN I (HIGH SENSITIVITY)  TROPONIN I (HIGH SENSITIVITY)    EKG EKG Interpretation  Date/Time:  Saturday September 14 2019 15:41:06 EST Ventricular Rate:  94 PR  Interval:    QRS Duration: 99 QT Interval:  388 QTC Calculation: 486 R Axis:   62 Text Interpretation: Sinus rhythm Prolonged PR interval Anterior infarct, old No significant change was found Confirmed by Ezequiel Essex [phone]) on 09/14/2019 3:44:44 PM   Radiology CT Angio Head W or Wo Contrast  Result Date: 09/14/2019 CLINICAL DATA:  Altered mental status.  Rule out stroke. EXAM: CT ANGIOGRAPHY HEAD AND NECK CT PERFUSION BRAIN TECHNIQUE: Multidetector CT imaging of the head and neck was performed using the standard protocol during bolus administration of intravenous contrast. Multiplanar CT image reconstructions and MIPs were obtained to evaluate the vascular anatomy. Carotid stenosis measurements (when applicable) are obtained utilizing NASCET criteria, using the distal internal carotid diameter as the denominator. Multiphase CT imaging of the brain was performed following IV bolus contrast injection. Subsequent parametric perfusion maps were calculated using RAPID software. CONTRAST:  173m OMNIPAQUE IOHEXOL 350 MG/ML SOLN COMPARISON:  CT head 09/14/2019 FINDINGS: CTA NECK FINDINGS Aortic arch: Incomplete evaluation of the arch. Atherosclerotic disease in the aortic arch. Origins of the innominate and left carotid artery are not imaged. Left subclavian artery is patent proximally. Right carotid system: Right common carotid artery patent. Atherosclerotic disease in the proximal right internal carotid artery. Ulcerated plaque. Ulcer measures approximately 4 mm in diameter and projects posteriorly. No thrombus in the ulcer. Approximately 50% diameter stenosis proximal right internal carotid artery. Left carotid system: Left common carotid artery widely patent.  Atherosclerotic disease left carotid bifurcation without significant stenosis. Vertebral arteries: Left vertebral artery dominant. Both vertebral arteries patent to the basilar. Skeleton: Cervical spondylosis.  No acute skeletal abnormality. Other neck: Negative for mass or adenopathy in the neck. Upper chest: Lung apices clear bilaterally. Review of the MIP images confirms the above findings CTA HEAD FINDINGS Anterior circulation: Atherosclerotic calcification in the cavernous carotid bilaterally without significant stenosis. Anterior and middle cerebral arteries patent bilaterally. Tortuosity of these vessels especially the anterior cerebral arteries. Posterior circulation: Both vertebral arteries patent to the basilar. PICA patent bilaterally. Basilar widely patent. AICA, superior cerebellar, posterior cerebral arteries patent bilaterally without stenosis or occlusion Venous sinuses: Normal venous enhancement Anatomic variants: None Review of the MIP images confirms the above findings CT Brain Perfusion Findings: ASPECTS: 10 CBF (<30%) Volume: 06mPerfusion (Tmax>6.0s) volume: 2872mismatch Volume: 52m81mfarction Location:Delayed perfusion is noted in the left temporal lobe and right frontotemporal lobe. This could be due to ischemia however the pattern is somewhat atypical and this could be artifact. IMPRESSION: 1. 28 ml of delayed perfusion in the temporal lobes bilaterally. Favor artifact over ischemia. Recommend MRI to evaluate for infarct. 2. 50% diameter stenosis proximal right internal carotid artery with 4 mm plaque ulceration. No thrombus in the vessel. 3. Atherosclerotic disease left carotid bifurcation without significant stenosis 4. No significant vertebral artery stenosis 5. No significant intracranial stenosis or large vessel occlusion. Electronically Signed   By: CharFranchot Gallo.   On: 09/14/2019 15:49   DG Chest 1 View  Result Date: 09/14/2019 CLINICAL DATA:  Aphasia EXAM: CHEST  1 VIEW  COMPARISON:  Chest radiograph 05/04/2019 FINDINGS: Stable cardiomediastinal contours. Left chest ICD remains in place. There is mild coarsening of the interstitium bilaterally likely representing chronic changes. No focal infiltrate. No pneumothorax or large pleural effusion. No acute finding in the visualized skeleton. IMPRESSION: Mild coarsening of the interstitium bilaterally favored represent chronic changes. No evidence of active disease. Electronically Signed   By: NancAudie Pinto.   On: 09/14/2019 15:50  CT Angio Neck W and/or Wo Contrast  Result Date: 09/14/2019 CLINICAL DATA:  Altered mental status.  Rule out stroke. EXAM: CT ANGIOGRAPHY HEAD AND NECK CT PERFUSION BRAIN TECHNIQUE: Multidetector CT imaging of the head and neck was performed using the standard protocol during bolus administration of intravenous contrast. Multiplanar CT image reconstructions and MIPs were obtained to evaluate the vascular anatomy. Carotid stenosis measurements (when applicable) are obtained utilizing NASCET criteria, using the distal internal carotid diameter as the denominator. Multiphase CT imaging of the brain was performed following IV bolus contrast injection. Subsequent parametric perfusion maps were calculated using RAPID software. CONTRAST:  180m OMNIPAQUE IOHEXOL 350 MG/ML SOLN COMPARISON:  CT head 09/14/2019 FINDINGS: CTA NECK FINDINGS Aortic arch: Incomplete evaluation of the arch. Atherosclerotic disease in the aortic arch. Origins of the innominate and left carotid artery are not imaged. Left subclavian artery is patent proximally. Right carotid system: Right common carotid artery patent. Atherosclerotic disease in the proximal right internal carotid artery. Ulcerated plaque. Ulcer measures approximately 4 mm in diameter and projects posteriorly. No thrombus in the ulcer. Approximately 50% diameter stenosis proximal right internal carotid artery. Left carotid system: Left common carotid artery widely  patent. Atherosclerotic disease left carotid bifurcation without significant stenosis. Vertebral arteries: Left vertebral artery dominant. Both vertebral arteries patent to the basilar. Skeleton: Cervical spondylosis.  No acute skeletal abnormality. Other neck: Negative for mass or adenopathy in the neck. Upper chest: Lung apices clear bilaterally. Review of the MIP images confirms the above findings CTA HEAD FINDINGS Anterior circulation: Atherosclerotic calcification in the cavernous carotid bilaterally without significant stenosis. Anterior and middle cerebral arteries patent bilaterally. Tortuosity of these vessels especially the anterior cerebral arteries. Posterior circulation: Both vertebral arteries patent to the basilar. PICA patent bilaterally. Basilar widely patent. AICA, superior cerebellar, posterior cerebral arteries patent bilaterally without stenosis or occlusion Venous sinuses: Normal venous enhancement Anatomic variants: None Review of the MIP images confirms the above findings CT Brain Perfusion Findings: ASPECTS: 10 CBF (<30%) Volume: 055mPerfusion (Tmax>6.0s) volume: 2811mismatch Volume: 56m90mfarction Location:Delayed perfusion is noted in the left temporal lobe and right frontotemporal lobe. This could be due to ischemia however the pattern is somewhat atypical and this could be artifact. IMPRESSION: 1. 28 ml of delayed perfusion in the temporal lobes bilaterally. Favor artifact over ischemia. Recommend MRI to evaluate for infarct. 2. 50% diameter stenosis proximal right internal carotid artery with 4 mm plaque ulceration. No thrombus in the vessel. 3. Atherosclerotic disease left carotid bifurcation without significant stenosis 4. No significant vertebral artery stenosis 5. No significant intracranial stenosis or large vessel occlusion. Electronically Signed   By: CharFranchot Gallo.   On: 09/14/2019 15:49   CT CEREBRAL PERFUSION W CONTRAST  Result Date: 09/14/2019 CLINICAL DATA:   Altered mental status.  Rule out stroke. EXAM: CT ANGIOGRAPHY HEAD AND NECK CT PERFUSION BRAIN TECHNIQUE: Multidetector CT imaging of the head and neck was performed using the standard protocol during bolus administration of intravenous contrast. Multiplanar CT image reconstructions and MIPs were obtained to evaluate the vascular anatomy. Carotid stenosis measurements (when applicable) are obtained utilizing NASCET criteria, using the distal internal carotid diameter as the denominator. Multiphase CT imaging of the brain was performed following IV bolus contrast injection. Subsequent parametric perfusion maps were calculated using RAPID software. CONTRAST:  125mL63mIPAQUE IOHEXOL 350 MG/ML SOLN COMPARISON:  CT head 09/14/2019 FINDINGS: CTA NECK FINDINGS Aortic arch: Incomplete evaluation of the arch. Atherosclerotic disease in the aortic arch. Origins of the  innominate and left carotid artery are not imaged. Left subclavian artery is patent proximally. Right carotid system: Right common carotid artery patent. Atherosclerotic disease in the proximal right internal carotid artery. Ulcerated plaque. Ulcer measures approximately 4 mm in diameter and projects posteriorly. No thrombus in the ulcer. Approximately 50% diameter stenosis proximal right internal carotid artery. Left carotid system: Left common carotid artery widely patent. Atherosclerotic disease left carotid bifurcation without significant stenosis. Vertebral arteries: Left vertebral artery dominant. Both vertebral arteries patent to the basilar. Skeleton: Cervical spondylosis.  No acute skeletal abnormality. Other neck: Negative for mass or adenopathy in the neck. Upper chest: Lung apices clear bilaterally. Review of the MIP images confirms the above findings CTA HEAD FINDINGS Anterior circulation: Atherosclerotic calcification in the cavernous carotid bilaterally without significant stenosis. Anterior and middle cerebral arteries patent bilaterally.  Tortuosity of these vessels especially the anterior cerebral arteries. Posterior circulation: Both vertebral arteries patent to the basilar. PICA patent bilaterally. Basilar widely patent. AICA, superior cerebellar, posterior cerebral arteries patent bilaterally without stenosis or occlusion Venous sinuses: Normal venous enhancement Anatomic variants: None Review of the MIP images confirms the above findings CT Brain Perfusion Findings: ASPECTS: 10 CBF (<30%) Volume: 32m Perfusion (Tmax>6.0s) volume: 259mMismatch Volume: 2829mnfarction Location:Delayed perfusion is noted in the left temporal lobe and right frontotemporal lobe. This could be due to ischemia however the pattern is somewhat atypical and this could be artifact. IMPRESSION: 1. 28 ml of delayed perfusion in the temporal lobes bilaterally. Favor artifact over ischemia. Recommend MRI to evaluate for infarct. 2. 50% diameter stenosis proximal right internal carotid artery with 4 mm plaque ulceration. No thrombus in the vessel. 3. Atherosclerotic disease left carotid bifurcation without significant stenosis 4. No significant vertebral artery stenosis 5. No significant intracranial stenosis or large vessel occlusion. Electronically Signed   By: ChaFranchot GalloD.   On: 09/14/2019 15:49   CT HEAD CODE STROKE WO CONTRAST`  Result Date: 09/14/2019 CLINICAL DATA:  Code stroke.  Altered mental status EXAM: CT HEAD WITHOUT CONTRAST TECHNIQUE: Contiguous axial images were obtained from the base of the skull through the vertex without intravenous contrast. COMPARISON:  CT head 05/04/2019 FINDINGS: Brain: Moderate atrophy and moderate chronic microvascular ischemic changes in the white matter are stable. No acute infarct, hemorrhage, mass. No fluid collection or midline shift. Vascular: Negative for hyperdense vessel Skull: Negative Sinuses/Orbits: Mild mucosal edema in the frontal sinus. Remaining sinuses clear. Negative orbit. Other: None ASPECTS (AlbCobbtroke Program Early CT Score) - Ganglionic level infarction (caudate, lentiform nuclei, internal capsule, insula, M1-M3 cortex): 7 - Supraganglionic infarction (M4-M6 cortex): 3 Total score (0-10 with 10 being normal): 10 IMPRESSION: 1. No acute abnormality 2. ASPECTS is 10 3. Atrophy and chronic microvascular ischemic changes stable from the prior study. 4. These results were called by telephone at the time of interpretation on 09/14/2019 at 3:01 pm to provider Rancour MD, who verbally acknowledged these results. Electronically Signed   By: ChaFranchot GalloD.   On: 09/14/2019 15:01    Procedures Procedures (including critical care time)  Medications Ordered in ED Medications  sodium chloride flush (NS) 0.9 % injection 3 mL (3 mLs Intravenous Not Given 09/14/19 1423)  iohexol (OMNIPAQUE) 350 MG/ML injection 125 mL (125 mLs Intravenous Contrast Given 09/14/19 1519)  sodium chloride 0.9 % bolus 500 mL (500 mLs Intravenous New Bag/Given 09/14/19 1625)    ED Course  I have reviewed the triage vital signs and the nursing notes.  Pertinent labs &  imaging results that were available during my care of the patient were reviewed by me and considered in my medical decision making (see chart for details).  Clinical Course as of Sep 13 1654  Sat Sep 14, 2019  1430 Patient evaluated by Dr. Wyvonnia Dusky at bedside. Code stroke called due to aphasia.    [CC]  1542 Reassessed patient. Patient able to communicate very slightly. Denies any pain.    [CC]  1635 Spoke to Dr. Denton Brick who agrees to evaluate patient and admit for further workup and MRI of brain to rule our intracranial infarct.    [CC]    Clinical Course User Index [CC] Cheek, Comer Locket, PA-C   MDM Rules/Calculators/A&P     CHA2DS2/VAS Stroke Risk Points  Current as of 7 minutes ago     4 >= 2 Points: High Risk  1 - 1.99 Points: Medium Risk  0 Points: Low Risk    The previous score was 3 on 05/04/2019.: Last Change:     Details    This  score determines the patient's risk of having a stroke if the  patient has atrial fibrillation.       Points Metrics  0 Has Congestive Heart Failure:  No    Current as of 7 minutes ago  1 Has Vascular Disease:  Yes     Current as of 7 minutes ago  1 Has Hypertension:  Yes    Current as of 7 minutes ago  2 Age:  68    Current as of 7 minutes ago  0 Has Diabetes:  No    Current as of 7 minutes ago  0 Had Stroke:  No  Had TIA:  No  Had thromboembolism:  No    Current as of 7 minutes ago  0 Male:  No    Current as of 7 minutes ago                         85 year old male presents to the ED via EMS from [place] due to altered mental status. Vitals all within normal limits. Patient is afebrile, not tachycardic or hypoxic. Patient in no acute distress. Patient does not answer questions during initial assessment. Spoke to Laureles which appears to be different from baseline. Patient is typically able to ambulate with a walker at baseline. Lungs clear to auscultation bilaterally. Normal heart sounds. Equal grip strength bilaterally. No facial droop. Cool lower extremities, but distal pulses intact. Abdomen soft, non-distended, and non-tender. No peritoneal signs. Given new aphasia, code stroke called. See note above. Suspect CVA vs. TIA vs. metabolic encephalopathy vs. Worsening dementia. Doubt sepsis/ infectious process given normal vitals and patient is afebrile.   CBC significant for thrombocytopenia with platelets at 148, but no leukocytosis.  Lactic acid elevated 2.2. Will give 500 bolus. Prothrombin time elevated at 15.4.  CMP significant for elevated alk phos at 162, elevated AST at 71, AKI with BUN at 34. Initial troponin normal at 12. Will get delta troponin to rule out ACS. EKG reviewed which demonstrates sinus rhythm and prolonged PR interval. No new ischemic changes. CXR reviewed which demonstrates mild coarsening of interstitium bilaterally favors chronic changes, no signs of  infection.  CT head personally reviewed which is negative for acute abnormalities.  Will add on CTA of neck and head and CT cerebral perfusion per request from neurology. Dr. Wyvonnia Dusky spoke to Dr. Lyndel Safe with neurology who believes episode could possible have been a TIA or  other cause. Patient speaking slightly more now.  CTA head/neck and perfusion study personally reviewed which demonstrates: 1. 28 ml of delayed perfusion in the temporal lobes bilaterally.  Favor artifact over ischemia. Recommend MRI to evaluate for infarct.  2. 50% diameter stenosis proximal right internal carotid artery with  4 mm plaque ulceration. No thrombus in the vessel.  3. Atherosclerotic disease left carotid bifurcation without  significant stenosis  4. No significant vertebral artery stenosis  5. No significant intracranial stenosis or large vessel occlusion.   Will consult hospitalist to admit for further evaluation and MRI to rule out infarct. Spoke to Dr. Denton Brick who agrees to admit patient. COVID test ordered. UA still pending.    Final Clinical Impression(s) / ED Diagnoses Final diagnoses:  Aphasia  Altered mental status, unspecified altered mental status type    Rx / DC Orders ED Discharge Orders    None       Romie Levee 09/14/19 1655    Ezequiel Essex, MD 09/14/19 [place]

## 2019-09-14 NOTE — ED Notes (Signed)
Code stroke cancelled. Not TPA candidate.

## 2019-09-14 NOTE — Progress Notes (Signed)
Pharmacy Antibiotic Note  Phillip Taylor is a 85 y.o. male admitted on 09/14/2019 with Fever of unknown etiology.  Pharmacy has been consulted for aztreonam and vancomycin dosing.  Plan: Vancomycin 1500 mg IV x 1 loading dose Aztreonam 1 gram IV every 8 hours AP RPh to f/u maintenance vancomycin dosing Monitor clinical progress, cultures/sensitivities, renal function, abx plan, vancomycin levels as indicated   Height: 6' (182.9 cm) Weight: 134 lb 11.2 oz (61.1 kg) IBW/kg (Calculated) : 77.6  Temp (24hrs), Avg:99.9 F (37.7 C), Min:98.9 F (37.2 C), Max:100.8 F (38.2 C)  Recent Labs  Lab 09/14/19 1415 09/14/19 1431 09/14/19 1636  WBC 5.0  --   --   CREATININE 1.15  --   --   LATICACIDVEN  --  2.2* 1.6    Estimated Creatinine Clearance: 35.4 mL/min (by C-G formula based on SCr of 1.15 mg/dL).    Allergies  Allergen Reactions  . Prevacid [Lansoprazole] Other (See Comments)    Does not remeber  . Prilosec [Omeprazole] Other (See Comments)    Does not remeber  . Codeine Other (See Comments)    Does not know   . Penicillins Rash    .Did it involve swelling of the face/tongue/throat, SOB, or low BP? No Did it involve sudden or severe rash/hives, skin peeling, or any reaction on the inside of your mouth or nose? Yes Did you need to seek medical attention at a hospital or doctor's office? Unknown When did it last happen?Unknown If all above answers are "NO", may proceed with cephalosporin use.      Antimicrobials this admission: 12/13 vancomycin >>  12/13 aztreonam >>   Dose adjustments this admission:  Microbiology results: 12/12 BCx: ip 12/12 UCx: ip   Thank you for allowing pharmacy to be a part of this patient's care.  Jens Som, PharmD 09/14/2019 11:44 PM

## 2019-09-14 NOTE — Consult Note (Signed)
[facility] Tele[facility]urology Consult Services   Date of Service:   09/14/2019 15:07:57  Impression:     .  G45.9 - Transient cerebral ischaemic attack, unspecified  Comments/Sign-Out: 85 yo M h/o HTN, carotid stenosis, CKD, A fib, on Eliquis, dementia, HTN, anemia, PE, walker at baseline, p/w episode speech abnormality after going to the kitchen and eating some food. Pt spoke to EMS and is speaking now. Unclear how long symptoms lasted.  Metrics: Last Known Well: 09/14/2019 12:00:00 [facility] Notification Time: 09/14/2019 15:07:57 Arrival Time: 09/14/2019 15:07:00 Stamp Time: 09/14/2019 15:07:57 Time First Login Attempt: 09/14/2019 15:17:27 Video Start Time: 09/14/2019 15:17:27  Symptoms: speech abnormality NIHSS Start Assessment Time: 09/14/2019 15:22:43 Patient is not a candidate for Alteplase/Activase. Patient was not deemed candidate for Alteplase/Activase thrombolytics because of Resolved symptoms (no residual disabling symptoms). Video End Time: 09/14/2019 15:36:05  CT head showed no acute hemorrhage or acute core infarct.  Lower Likelihood of Large Vessel Occlusion but Following Stat Studies are Recommended  CTA Head and Neck. CT Perfusion.   ED Physician notified of diagnostic impression and management plan on 09/14/2019 15:36:06  Sign Out: Pt seems to have improvement in speech, may be at baseline, unclear if this episode was TIA v other      .  Discussed with Emergency Department Provider    ------------------------------------------------------------------------------  History of Present Illness: Patient is a 85 year old Male.  Patient was brought by EMS for symptoms of speech abnormality  85 yo M h/o HTN, carotid stenosis, CKD, A fib, on Eliquis, dementia, HTN, anemia, PE, walker at baseline, p/w episode speech abnormality after going to the kitchen and eating some food. Pt spoke to EMS and is speaking now. Unclear how long symptoms  lasted.   Past Medical History:     . Hypertension  Anticoagulant use:  Eliquis     Examination: BP(159/78), Pulse(92), Blood Glucose(129) 1A: Level of Consciousness - Alert; keenly responsive + 0 1B: Ask Month and Age - Both Questions Right + 0 1C: Blink Eyes & Squeeze Hands - Performs Both Tasks + 0 2: Test Horizontal Extraocular Movements - Normal + 0 3: Test Visual Fields - No Visual Loss + 0 4: Test Facial Palsy (Use Grimace if Obtunded) - Normal symmetry + 0 5A: Test Left Arm Motor Drift - No Drift for 10 Seconds + 0 5B: Test Right Arm Motor Drift - No Drift for 10 Seconds + 0 6A: Test Left Leg Motor Drift - No Drift for 5 Seconds + 0 6B: Test Right Leg Motor Drift - No Drift for 5 Seconds + 0 7: Test Limb Ataxia (FNF/Heel-Shin) - No Ataxia + 0 8: Test Sensation - Normal; No sensory loss + 0 9: Test Language/Aphasia - Normal; No aphasia + 0 10: Test Dysarthria - Normal + 0 11: Test Extinction/Inattention - No abnormality + 0  NIHSS Score: 0   Patient/Family was informed the Neurology Consult would happen via TeleHealth consult by way of interactive audio and video telecommunications and consented to receiving care in this manner.   Due to the immediate potential for life-threatening deterioration due to underlying acute neurologic illness, I spent 30 minutes providing critical care. This time includes time for face to face visit via telemedicine, review of medical records, imaging studies and discussion of findings with providers, the patient and/or family.   Dr Launa Grill   [facility] [phone]   Case [id number]

## 2019-09-14 NOTE — Progress Notes (Signed)
CODE STROKE 1413 call time 1431 exam started 1435 exam finished, images to [facility]. 86 called [facility] Radiology, spoke with Marzetta Board.

## 2019-09-15 ENCOUNTER — Observation Stay (HOSPITAL_BASED_OUTPATIENT_CLINIC_OR_DEPARTMENT_OTHER): Payer: Medicare Other

## 2019-09-15 DIAGNOSIS — Z86711 Personal history of pulmonary embolism: Secondary | ICD-10-CM | POA: Diagnosis not present

## 2019-09-15 DIAGNOSIS — Z20828 Contact with and (suspected) exposure to other viral communicable diseases: Secondary | ICD-10-CM | POA: Diagnosis present

## 2019-09-15 DIAGNOSIS — G9341 Metabolic encephalopathy: Secondary | ICD-10-CM | POA: Diagnosis present

## 2019-09-15 DIAGNOSIS — Z66 Do not resuscitate: Secondary | ICD-10-CM | POA: Diagnosis present

## 2019-09-15 DIAGNOSIS — Z8711 Personal history of peptic ulcer disease: Secondary | ICD-10-CM | POA: Diagnosis not present

## 2019-09-15 DIAGNOSIS — Z79890 Hormone replacement therapy (postmenopausal): Secondary | ICD-10-CM | POA: Diagnosis not present

## 2019-09-15 DIAGNOSIS — K319 Disease of stomach and duodenum, unspecified: Secondary | ICD-10-CM | POA: Diagnosis present

## 2019-09-15 DIAGNOSIS — I480 Paroxysmal atrial fibrillation: Secondary | ICD-10-CM | POA: Diagnosis present

## 2019-09-15 DIAGNOSIS — Z8521 Personal history of malignant neoplasm of larynx: Secondary | ICD-10-CM | POA: Diagnosis not present

## 2019-09-15 DIAGNOSIS — R4182 Altered mental status, unspecified: Secondary | ICD-10-CM

## 2019-09-15 DIAGNOSIS — Z79899 Other long term (current) drug therapy: Secondary | ICD-10-CM | POA: Diagnosis not present

## 2019-09-15 DIAGNOSIS — I6389 Other cerebral infarction: Secondary | ICD-10-CM | POA: Diagnosis not present

## 2019-09-15 DIAGNOSIS — R509 Fever, unspecified: Secondary | ICD-10-CM | POA: Diagnosis present

## 2019-09-15 DIAGNOSIS — F0391 Unspecified dementia with behavioral disturbance: Secondary | ICD-10-CM | POA: Diagnosis present

## 2019-09-15 DIAGNOSIS — I129 Hypertensive chronic kidney disease with stage 1 through stage 4 chronic kidney disease, or unspecified chronic kidney disease: Secondary | ICD-10-CM | POA: Diagnosis present

## 2019-09-15 DIAGNOSIS — R1310 Dysphagia, unspecified: Secondary | ICD-10-CM | POA: Diagnosis present

## 2019-09-15 DIAGNOSIS — E860 Dehydration: Secondary | ICD-10-CM | POA: Diagnosis present

## 2019-09-15 DIAGNOSIS — R2990 Unspecified symptoms and signs involving the nervous system: Secondary | ICD-10-CM

## 2019-09-15 DIAGNOSIS — N183 Chronic kidney disease, stage 3 unspecified: Secondary | ICD-10-CM | POA: Diagnosis present

## 2019-09-15 DIAGNOSIS — E872 Acidosis: Secondary | ICD-10-CM | POA: Diagnosis not present

## 2019-09-15 DIAGNOSIS — E876 Hypokalemia: Secondary | ICD-10-CM | POA: Diagnosis not present

## 2019-09-15 DIAGNOSIS — E039 Hypothyroidism, unspecified: Secondary | ICD-10-CM | POA: Diagnosis present

## 2019-09-15 DIAGNOSIS — Z7901 Long term (current) use of anticoagulants: Secondary | ICD-10-CM | POA: Diagnosis not present

## 2019-09-15 DIAGNOSIS — N400 Enlarged prostate without lower urinary tract symptoms: Secondary | ICD-10-CM | POA: Diagnosis present

## 2019-09-15 DIAGNOSIS — R4701 Aphasia: Secondary | ICD-10-CM | POA: Diagnosis present

## 2019-09-15 DIAGNOSIS — E785 Hyperlipidemia, unspecified: Secondary | ICD-10-CM | POA: Diagnosis present

## 2019-09-15 LAB — BASIC METABOLIC PANEL
Anion gap: 10 (ref 5–15)
BUN: 25 mg/dL — ABNORMAL HIGH (ref 8–23)
CO2: 23 mmol/L (ref 22–32)
Calcium: 8 mg/dL — ABNORMAL LOW (ref 8.9–10.3)
Chloride: 105 mmol/L (ref 98–111)
Creatinine, Ser: 0.95 mg/dL (ref 0.61–1.24)
GFR calc Af Amer: >60 mL/min (ref >60)
GFR calc non Af Amer: >60 mL/min (ref >60)
Glucose, Bld: 130 mg/dL — ABNORMAL HIGH (ref 70–99)
Potassium: 3.5 mmol/L (ref 3.5–5.1)
Sodium: 138 mmol/L (ref 135–145)

## 2019-09-15 LAB — CBC
HCT: 40.8 % (ref 39.0–52.0)
Hemoglobin: 13.4 g/dL (ref 13.0–17.0)
MCH: 32.3 pg (ref 26.0–34.0)
MCHC: 32.8 g/dL (ref 30.0–36.0)
MCV: 98.3 fL (ref 80.0–100.0)
Platelets: 120 10*3/uL — ABNORMAL LOW (ref 150–400)
RBC: 4.15 MIL/uL — ABNORMAL LOW (ref 4.22–5.81)
RDW: 14.3 % (ref 11.5–15.5)
WBC: 4.5 10*3/uL (ref 4.0–10.5)
nRBC: 0 % (ref 0.0–0.2)

## 2019-09-15 LAB — LIPID PANEL
Cholesterol: 135 mg/dL (ref 0–200)
HDL: 25 mg/dL — ABNORMAL LOW (ref >40)
LDL Cholesterol: 86 mg/dL (ref 0–99)
Total CHOL/HDL Ratio: 5.4 RATIO
Triglycerides: 121 mg/dL (ref <150)
VLDL: 24 mg/dL (ref 0–40)

## 2019-09-15 LAB — ECHOCARDIOGRAM COMPLETE
Height: 72 in
Weight: 2155.22 oz

## 2019-09-15 LAB — HEMOGLOBIN A1C
Hgb A1c MFr Bld: 5.6 % (ref 4.8–5.6)
Mean Plasma Glucose: 114.02 mg/dL

## 2019-09-15 LAB — SARS CORONAVIRUS 2 (TAT 6-24 HRS): SARS Coronavirus 2: NEGATIVE

## 2019-09-15 LAB — MRSA PCR SCREENING: MRSA by PCR: NEGATIVE

## 2019-09-15 MED ORDER — VANCOMYCIN HCL 500 MG IV SOLR
500.0000 mg | Freq: Two times a day (BID) | INTRAVENOUS | Status: DC
  Filled 2019-09-15 (×3): qty 500

## 2019-09-15 NOTE — Progress Notes (Signed)
*  PRELIMINARY RESULTS* Echocardiogram 2D Echocardiogram has been performed.  Phillip Taylor 09/15/2019, 11:19 AM

## 2019-09-15 NOTE — Progress Notes (Signed)
Pharmacy Antibiotic Note  Phillip Taylor is a 85 y.o. male admitted on 09/14/2019 with Fever of unknown etiology.  Pharmacy has been consulted for aztreonam and vancomycin dosing.  Plan: Vancomycin 1500 mg IV x 1 loading dose, then 500mg  IV q12h Aztreonam 1 gram IV every 8 hours Monitor clinical progress, cultures/sensitivities, renal function, abx plan, vancomycin levels as indicated  Height: 6' (182.9 cm) Weight: 134 lb 11.2 oz (61.1 kg) IBW/kg (Calculated) : 77.6  Temp (24hrs), Avg:99.9 F (37.7 C), Min:98.9 F (37.2 C), Max:100.8 F (38.2 C)  Recent Labs  Lab 09/14/19 1415 09/14/19 1431 09/14/19 1636 09/15/19 0439  WBC 5.0  --   --  4.5  CREATININE 1.15  --   --  0.95  LATICACIDVEN  --  2.2* 1.6  --     Estimated Creatinine Clearance: 42.9 mL/min (by C-G formula based on SCr of 0.95 mg/dL).    Allergies  Allergen Reactions  . Prevacid [Lansoprazole] Other (See Comments)    Does not remeber  . Prilosec [Omeprazole] Other (See Comments)    Does not remeber  . Codeine Other (See Comments)    Does not know   . Penicillins Rash    .Did it involve swelling of the face/tongue/throat, SOB, or low BP? No Did it involve sudden or severe rash/hives, skin peeling, or any reaction on the inside of your mouth or nose? Yes Did you need to seek medical attention at a hospital or doctor's office? Unknown When did it last happen?Unknown If all above answers are "NO", may proceed with cephalosporin use.      Antimicrobials this admission: 12/13 vancomycin >>  12/13 aztreonam >>   Dose adjustments this admission:  Microbiology results: 12/12 BCx: pending 12/12 UCx: pending 12/12 SARS -2CV is negative  Thank you for allowing pharmacy to be a part of this patient's care.  Isac Sarna, BS Pharm D, [place] Clinical Pharmacist Pager [phone] 09/15/2019 8:59 AM

## 2019-09-15 NOTE — Consult Note (Signed)
Referring Physician: Dr. Marylyn Ishihara    Chief Complaint: AMS  HPI: Phillip Taylor is an 85 y.o. male with history of atrial fibrillation on Eliquis, PE, CKD, carotid stenosis, HTN, hypothyroidism, vocal cord cancer s/p resection and dementia who presented from [place] for evaluation of AMS. Staff reported that the patient ate some broccoli, spit it out and then would not talk. He also had some trouble walking with his walker. In the ED, he mumbled some words but did not answer questions appropriately. The patient was not a tPA candidate due to time criteria. Not a candidate for thrombectomy due to NIHSS of 0.   Head CT was without acute abnormality. Cerebral perfusion and CTA head and neck done with 28 mL area of delayed perfusion in the temporal lobes bilaterally favoring artifact over ischemia and 50% diameter stenosis proximal right ICA with 4 mm plaque ulceration.    LSN: 12/11 tPA Given: No: Out of time window.   Past Medical History:  Diagnosis Date  . BPH (benign prostatic hyperplasia)   . Carotid sinus hypersensitivity   . Carotid stenosis, asymptomatic, bilateral   . Dementia ([place])   . Femur fracture ([place])   . GERD (gastroesophageal reflux disease)   . Hypertension   . Hypothyroidism   . Osteoporosis   . PONV (postoperative nausea and vomiting)   . Vocal cord cancer ([place]) 1995   cancer of the larynx s/p resection and XRT    Past Surgical History:  Procedure Laterality Date  . COLONOSCOPY  10/25/2012   Dr. Gala Romney: diverticulosis, multiple colon tubular adenomas removed. next TCS in 3 years.   . ESOPHAGOGASTRODUODENOSCOPY (EGD) WITH PROPOFOL N/A 04/11/2019   Procedure: ESOPHAGOGASTRODUODENOSCOPY (EGD) WITH PROPOFOL;  Surgeon: Daneil Dolin, MD;  Location[facility];  Service: Endoscopy;  Laterality: N/A;  2:00pm  . HERNIA REPAIR    . INTRAMEDULLARY (IM) NAIL INTERTROCHANTERIC Right 03/09/2019   Procedure: INTRAMEDULLARY (IM) NAIL INTERTROCHANTRIC;  Surgeon: Carole Civil, MD;  Location: [facility] ORS;  Service: Orthopedics;  Laterality: Right;  . MALONEY DILATION N/A 04/11/2019   Procedure: Venia Minks DILATION;  Surgeon: Daneil Dolin, MD;  Location: [facility];  Service: Endoscopy;  Laterality: N/A;  . NISSEN FUNDOPLICATION    . PACEMAKER INSERTION    . TRANSURETHRAL RESECTION OF PROSTATE    . vocal cord cancer removal  1995    Family History  Problem Relation Age of Onset  . Colon cancer Neg Hx    Social History:  reports that he has never smoked. He has never used smokeless tobacco. He reports that he does not drink alcohol or use drugs.  Allergies:  Allergies  Allergen Reactions  . Prevacid [Lansoprazole] Other (See Comments)    Does not remeber  . Prilosec [Omeprazole] Other (See Comments)    Does not remeber  . Codeine Other (See Comments)    Does not know   . Penicillins Rash    .Did it involve swelling of the face/tongue/throat, SOB, or low BP? No Did it involve sudden or severe rash/hives, skin peeling, or any reaction on the inside of your mouth or nose? Yes Did you need to seek medical attention at a hospital or doctor's office? Unknown When did it last happen?Unknown If all above answers are "NO", may proceed with cephalosporin use.      Medications:  Scheduled: .  stroke: mapping our early stages of recovery book   Does not apply Once  . enoxaparin (LOVENOX) injection  40  mg Subcutaneous Q24H  . pantoprazole (PROTONIX) IV  40 mg Intravenous Q24H   Continuous: . dextrose 5 % and 0.9% NaCl 75 mL/hr at 09/15/19 1304   No current facility-administered medications on file prior to encounter.   Current Outpatient Medications on File Prior to Encounter  Medication Sig Dispense Refill  . Amino Acids-Protein Hydrolys (FEEDING SUPPLEMENT, PRO-STAT SUGAR FREE 64,) LIQD Take 30 mLs by mouth 2 (two) times daily between meals.    Marland Kitchen apixaban (ELIQUIS) 5 MG TABS tablet Take 2 tablets (10 mg total) by mouth 2 (two) times daily.  Through 05/13/19.  Start 5 mg (1 tab) two times daily on 05/14/19 73 tablet 0  . calcium carbonate (TUMS EX) 750 MG chewable tablet Chew 1 tablet by mouth 2 (two) times daily.    . carvedilol (COREG) 3.125 MG tablet Take 3.125 mg by mouth 2 (two) times daily with a meal.    . levothyroxine (SYNTHROID) 75 MCG tablet Take 75 mcg by mouth daily before breakfast.    . mirtazapine (REMERON) 7.5 MG tablet Take 7.5 mg by mouth at bedtime.    . Nutritional Supplements (ENSURE ENLIVE PO) Take 1 Bottle by mouth daily.    . pantoprazole (PROTONIX) 40 MG tablet Take 40 mg by mouth daily.     . potassium chloride SA (K-DUR) 20 MEQ tablet Take 20 mEq by mouth daily.    . vitamin B-12 (CYANOCOBALAMIN) 1000 MCG tablet Take 1,000 mcg by mouth daily.    . clindamycin (CLEOCIN) 300 MG capsule Take 1 capsule (300 mg total) by mouth every 8 (eight) hours. (Patient not taking: Reported on 08/16/2019) 21 capsule 0  . fluconazole (DIFLUCAN) 100 MG tablet Take 200 mg on day 1, then 100 mg daily for 21 days (Patient not taking: Reported on 08/16/2019) 23 tablet 100  . levofloxacin (LEVAQUIN) 500 MG tablet Take 1 tablet (500 mg total) by mouth daily. (Patient not taking: Reported on 08/16/2019) 7 tablet 0  . NON FORMULARY Diet Type: REGULAR     .  stroke: mapping our early stages of recovery book   Does not apply Once  . enoxaparin (LOVENOX) injection  40 mg Subcutaneous Q24H  . pantoprazole (PROTONIX) IV  40 mg Intravenous Q24H     ROS: Unable to obtain due to cognitive impairment.   Physical Examination: Blood pressure 131/84, pulse 77, temperature (!) 97.5 F (36.4 C), temperature source Oral, resp. rate 16, height 6' (1.829 m), weight 61.1 kg, SpO2 99 %.  HEENT: [place]/AT Lungs: Respirations unlabored Ext: No edema  Neurologic Examination: Mental Status: Awake with decreased level of alertness. Poor attention. Does not make eye contact for most of the exam. Sparse speech output; in this context his output is  fluent, but phrases are short. Able to follow some commands but requires repeated requests. Able to count fingers and name one object (glove). Does not answer orientation questions except for his name. Hypophonic speech with mild dysarthria.  Cranial Nerves: II:  Does not cooperate with visual field testing. Will gaze at examiner briefly and can count fingers. PERRL.  III,IV, VI: Horizontal EOMI. No ptosis.  VII: Face symmetric VIII: Hearing intact to voice IX,X: Severe hypophonia with mild hoarseness XI: Head is midline XII: Not cooperative.  Motor: 4+/5 bilateral upper and lower extremities without asymmetry.  Sensory: Reacts to tactile stimuli x 4 Deep Tendon Reflexes:  Hypoactive x 4 Cerebellar: Not cooperative/following commands Gait: Deferred  Results for orders placed or performed during the hospital encounter of  09/14/19 (from the past 48 hour(s))  Urinalysis, Routine w reflex microscopic     Status: Abnormal   Collection Time: 09/14/19  1:36 PM  Result Value Ref Range   Color, Urine YELLOW YELLOW   APPearance CLEAR CLEAR   Specific Gravity, Urine >1.046 (H) 1.005 - 1.030   pH 5.0 5.0 - 8.0   Glucose, UA NEGATIVE NEGATIVE mg/dL   Hgb urine dipstick SMALL (A) NEGATIVE   Bilirubin Urine NEGATIVE NEGATIVE   Ketones, ur 5 (A) NEGATIVE mg/dL   Protein, ur NEGATIVE NEGATIVE mg/dL   Nitrite NEGATIVE NEGATIVE   Leukocytes,Ua NEGATIVE NEGATIVE   RBC / HPF 0-5 0 - 5 RBC/hpf   WBC, UA 0-5 0 - 5 WBC/hpf   Bacteria, UA NONE SEEN NONE SEEN   Mucus PRESENT     Comment: Performed at [facility], [address], [place], [place] [address]  Blood culture (routine x 2)     Status: None (Preliminary result)   Collection Time: 09/14/19  1:48 PM   Specimen: Right Antecubital; Blood  Result Value Ref Range   Specimen Description      RIGHT ANTECUBITAL BOTTLES DRAWN AEROBIC AND ANAEROBIC   Special Requests Blood Culture adequate volume    Culture      NO GROWTH 1 DAY Performed at [facility], [address] [place], [place] [address]    Report Status PENDING   CBG monitoring, ED     Status: Abnormal   Collection Time: 09/14/19  1:58 PM  Result Value Ref Range   Glucose-Capillary 129 (H) 70 - 99 mg/dL  Comprehensive metabolic panel     Status: Abnormal   Collection Time: 09/14/19  2:15 PM  Result Value Ref Range   Sodium 138 135 - 145 mmol/L   Potassium 4.3 3.5 - 5.1 mmol/L   Chloride 103 98 - 111 mmol/L   CO2 23 22 - 32 mmol/L   Glucose, Bld 131 (H) 70 - 99 mg/dL   BUN 34 (H) 8 - 23 mg/dL   Creatinine, Ser 1.15 0.61 - 1.24 mg/dL   Calcium 8.7 (L) 8.9 - 10.3 mg/dL   Total Protein 7.8 6.5 - 8.1 g/dL   Albumin 3.9 3.5 - 5.0 g/dL   AST 71 (H) 15 - 41 U/L   ALT 41 0 - 44 U/L   Alkaline Phosphatase 162 (H) 38 - 126 U/L   Total Bilirubin 1.3 (H) 0.3 - 1.2 mg/dL   GFR calc non Af Amer 55 (L) >60 mL/min   GFR calc Af Amer >60 >60 mL/min   Anion gap 12 5 - 15    Comment: Performed at [facility], [address] [place], [place] [address]  CBC     Status: Abnormal   Collection Time: 09/14/19  2:15 PM  Result Value Ref Range   WBC 5.0 4.0 - 10.5 K/uL   RBC 4.84 4.22 - 5.81 MIL/uL   Hemoglobin 15.7 13.0 - 17.0 g/dL   HCT 47.5 39.0 - 52.0 %   MCV 98.1 80.0 - 100.0 fL   MCH 32.4 26.0 - 34.0 pg   MCHC 33.1 30.0 - 36.0 g/dL   RDW 14.2 11.5 - 15.5 %   Platelets 148 (L) 150 - 400 K/uL   nRBC 0.0 0.0 - 0.2 %    Comment: Performed at [facility], [address], [place], [place] [address]  Troponin I (High Sensitivity)     Status: None   Collection Time: 09/14/19  2:15 PM  Result  Value Ref Range   Troponin I (High Sensitivity) 12 <18 ng/L    Comment: (NOTE) Elevated high sensitivity troponin I (hsTnI) values and significant  changes across serial measurements may suggest ACS but many other  chronic and acute conditions are known to elevate hsTnI results.  Refer to the "Links" section for chest pain algorithms and additional  guidance. Performed at [facility], [address], [place], [place] [address]   Blood culture (routine x 2)     Status: None (Preliminary result)   Collection Time: 09/14/19  2:15 PM   Specimen: Right Antecubital; Blood  Result Value Ref Range   Specimen Description RIGHT ANTECUBITAL BOTTLES DRAWN AEROBIC ONLY    Special Requests Blood Culture adequate volume    Culture      NO GROWTH 1 DAY Performed at [facility], [address] [place], [place] [address]    Report Status PENDING   TSH     Status: None   Collection Time: 09/14/19  2:15 PM  Result Value Ref Range   TSH 3.385 0.350 - 4.500 uIU/mL    Comment: Performed by a 3rd Generation assay with a functional sensitivity of <=0.01 uIU/mL. Performed at [facility], [address] [place], [place] [address]   Ethanol     Status: None   Collection Time: 09/14/19  2:21 PM  Result Value Ref Range   Alcohol, Ethyl (B) <10 <10 mg/dL    Comment: (NOTE) Lowest detectable limit for serum alcohol is 10 mg/dL. For medical purposes only. Performed at [facility], [address], [place], [place] [address]   Protime-INR     Status: Abnormal   Collection Time: 09/14/19  2:21 PM  Result Value Ref Range   Prothrombin Time 15.4 (H) 11.4 - 15.2 seconds   INR 1.2 0.8 - 1.2    Comment: (NOTE) INR goal varies based on device and disease states. Performed at [facility], [address], [place], [place] [address]   APTT     Status: Abnormal   Collection Time: 09/14/19  2:21 PM  Result Value Ref Range   aPTT 38 (H) 24 - 36 seconds    Comment:        IF BASELINE aPTT IS ELEVATED, SUGGEST PATIENT RISK ASSESSMENT BE USED TO DETERMINE APPROPRIATE ANTICOAGULANT THERAPY. Performed at [facility], [address] [place], [place] [address]   Urine rapid drug screen (hosp performed)     Status: None   Collection Time: 09/14/19  2:21 PM  Result Value Ref Range   Opiates NONE DETECTED NONE DETECTED   Cocaine NONE DETECTED NONE DETECTED   Benzodiazepines NONE DETECTED NONE  DETECTED   Amphetamines NONE DETECTED NONE DETECTED   Tetrahydrocannabinol NONE DETECTED NONE DETECTED   Barbiturates NONE DETECTED NONE DETECTED    Comment: (NOTE) DRUG SCREEN FOR MEDICAL PURPOSES ONLY.  IF CONFIRMATION IS NEEDED FOR ANY PURPOSE, NOTIFY LAB WITHIN 5 DAYS. LOWEST DETECTABLE LIMITS FOR URINE DRUG SCREEN Drug Class                     Cutoff (ng/mL) Amphetamine and metabolites    1000 Barbiturate and metabolites    200 Benzodiazepine                 [id number] Tricyclics and metabolites     300 Opiates and metabolites        300 Cocaine and metabolites        300 THC  30 Performed at [facility], [address], [place], [place] [address]   Lactic acid, plasma     Status: Abnormal   Collection Time: 09/14/19  2:31 PM  Result Value Ref Range   Lactic Acid, Venous 2.2 (HH) 0.5 - 1.9 mmol/L    Comment: CRITICAL RESULT CALLED TO, READ BACK BY AND VERIFIED WITH: CRUISE @ [id number] ON FO:[id number] BY HENDERSON L. Performed at [facility], [address] [place], [place] [address]   SARS CORONAVIRUS 2 (TAT 6-24 HRS) Nasopharyngeal Nasopharyngeal Swab     Status: None   Collection Time: 09/14/19  4:06 PM   Specimen: Nasopharyngeal Swab  Result Value Ref Range   SARS Coronavirus 2 NEGATIVE NEGATIVE    Comment: (NOTE) SARS-CoV-2 target nucleic acids are NOT DETECTED. The SARS-CoV-2 RNA is generally detectable in upper and lower respiratory specimens during the acute phase of infection. Negative results do not preclude SARS-CoV-2 infection, do not rule out co-infections with other pathogens, and should not be used as the sole basis for treatment or other patient management decisions. Negative results must be combined with clinical observations, patient history, and epidemiological information. The expected result is Negative. Fact Sheet for Patients: SugarRoll.be Fact Sheet for Healthcare  Providers: https://www.woods-mathews.com/ This test is not yet approved or cleared by the [place] FDA and  has been authorized for detection and/or diagnosis of SARS-CoV-2 by FDA under an Emergency Use Authorization (EUA). This EUA will remain  in effect (meaning this test can be used) for the duration of the COVID-19 declaration under Section 56 4(b)(1) of the Act, 21 U.S.C. section 360bbb-3(b)(1), unless the authorization is terminated or revoked sooner. Performed at [facility], [place] [address], [place], [place] [address]   Lactic acid, plasma     Status: None   Collection Time: 09/14/19  4:36 PM  Result Value Ref Range   Lactic Acid, Venous 1.6 0.5 - 1.9 mmol/L    Comment: Performed at [facility], [address], [place], [place] [address]  Troponin I (High Sensitivity)     Status: None   Collection Time: 09/14/19  4:36 PM  Result Value Ref Range   Troponin I (High Sensitivity) 12 <18 ng/L    Comment: (NOTE) Elevated high sensitivity troponin I (hsTnI) values and significant  changes across serial measurements may suggest ACS but many other  chronic and acute conditions are known to elevate hsTnI results.  Refer to the "Links" section for chest pain algorithms and additional  guidance. Performed at [facility], [address], [place], [place] [address]   Hemoglobin A1c     Status: None   Collection Time: 09/15/19  4:39 AM  Result Value Ref Range   Hgb A1c MFr Bld 5.6 4.8 - 5.6 %    Comment: (NOTE) Pre diabetes:          5.7%-6.4% Diabetes:              >6.4% Glycemic control for   <7.0% adults with diabetes    Mean Plasma Glucose 114.02 mg/dL    Comment: Performed at [facility] [address], [place], [place] [address]  Lipid panel     Status: Abnormal   Collection Time: 09/15/19  4:39 AM  Result Value Ref Range   Cholesterol 135 0 - 200 mg/dL   Triglycerides 121 <150 mg/dL   HDL 25 (L) >40 mg/dL   Total CHOL/HDL Ratio 5.4 RATIO    VLDL 24 0 - 40 mg/dL  LDL Cholesterol 86 0 - 99 mg/dL    Comment:        Total Cholesterol/HDL:CHD Risk Coronary Heart Disease Risk Table                     Men   Women  1/2 Average Risk   3.4   3.3  Average Risk       5.0   4.4  2 X Average Risk   9.6   7.1  3 X Average Risk  23.4   11.0        Use the calculated Patient Ratio above and the CHD Risk Table to determine the patient's CHD Risk.        ATP III CLASSIFICATION (LDL):  <100     mg/dL   Optimal  100-129  mg/dL   Near or Above                    Optimal  130-159  mg/dL   Borderline  160-189  mg/dL   High  >190     mg/dL   Very High Performed at [facility], [address] [place], [place] [address]   CBC     Status: Abnormal   Collection Time: 09/15/19  4:39 AM  Result Value Ref Range   WBC 4.5 4.0 - 10.5 K/uL   RBC 4.15 (L) 4.22 - 5.81 MIL/uL   Hemoglobin 13.4 13.0 - 17.0 g/dL   HCT 40.8 39.0 - 52.0 %   MCV 98.3 80.0 - 100.0 fL   MCH 32.3 26.0 - 34.0 pg   MCHC 32.8 30.0 - 36.0 g/dL   RDW 14.3 11.5 - 15.5 %   Platelets 120 (L) 150 - 400 K/uL   nRBC 0.0 0.0 - 0.2 %    Comment: Performed at [facility], [address], [place], [place] [id number]  Basic metabolic panel     Status: Abnormal   Collection Time: 09/15/19  4:39 AM  Result Value Ref Range   Sodium 138 135 - 145 mmol/L   Potassium 3.5 3.5 - 5.1 mmol/L    Comment: DELTA CHECK NOTED   Chloride 105 98 - 111 mmol/L   CO2 23 22 - 32 mmol/L   Glucose, Bld 130 (H) 70 - 99 mg/dL   BUN 25 (H) 8 - 23 mg/dL   Creatinine, Ser 0.95 0.61 - 1.24 mg/dL   Calcium 8.0 (L) 8.9 - 10.3 mg/dL   GFR calc non Af Amer >60 >60 mL/min   GFR calc Af Amer >60 >60 mL/min   Anion gap 10 5 - 15    Comment: Performed at [facility], [address], [place], [place] [address]  MRSA PCR Screening     Status: None   Collection Time: 09/15/19  4:13 PM   Specimen: Nasopharyngeal  Result Value Ref Range   MRSA by PCR NEGATIVE NEGATIVE    Comment:        The GeneXpert MRSA  Assay (FDA approved for NASAL specimens only), is one component of a comprehensive MRSA colonization surveillance program. It is not intended to diagnose MRSA infection nor to guide or monitor treatment for MRSA infections. Performed at [facility], [place] [address], [place], [place] [address]    CT Angio Head W or [place] Contrast  Result Date: 09/14/2019 CLINICAL DATA:  Altered mental status.  Rule out stroke. EXAM: CT ANGIOGRAPHY HEAD AND NECK CT PERFUSION BRAIN TECHNIQUE: Multidetector CT imaging of the head  and neck was performed using the standard protocol during bolus administration of intravenous contrast. Multiplanar CT image reconstructions and MIPs were obtained to evaluate the vascular anatomy. Carotid stenosis measurements (when applicable) are obtained utilizing NASCET criteria, using the distal internal carotid diameter as the denominator. Multiphase CT imaging of the brain was performed following IV bolus contrast injection. Subsequent parametric perfusion maps were calculated using RAPID software. CONTRAST:  139mL OMNIPAQUE IOHEXOL 350 MG/ML SOLN COMPARISON:  CT head 09/14/2019 FINDINGS: CTA NECK FINDINGS Aortic arch: Incomplete evaluation of the arch. Atherosclerotic disease in the aortic arch. Origins of the innominate and left carotid artery are not imaged. Left subclavian artery is patent proximally. Right carotid system: Right common carotid artery patent. Atherosclerotic disease in the proximal right internal carotid artery. Ulcerated plaque. Ulcer measures approximately 4 mm in diameter and projects posteriorly. No thrombus in the ulcer. Approximately 50% diameter stenosis proximal right internal carotid artery. Left carotid system: Left common carotid artery widely patent. Atherosclerotic disease left carotid bifurcation without significant stenosis. Vertebral arteries: Left vertebral artery dominant. Both vertebral arteries patent to the basilar. Skeleton: Cervical  spondylosis.  No acute skeletal abnormality. Other neck: Negative for mass or adenopathy in the neck. Upper chest: Lung apices clear bilaterally. Review of the MIP images confirms the above findings CTA HEAD FINDINGS Anterior circulation: Atherosclerotic calcification in the cavernous carotid bilaterally without significant stenosis. Anterior and middle cerebral arteries patent bilaterally. Tortuosity of these vessels especially the anterior cerebral arteries. Posterior circulation: Both vertebral arteries patent to the basilar. PICA patent bilaterally. Basilar widely patent. AICA, superior cerebellar, posterior cerebral arteries patent bilaterally without stenosis or occlusion Venous sinuses: Normal venous enhancement Anatomic variants: None Review of the MIP images confirms the above findings CT Brain Perfusion Findings: ASPECTS: 10 CBF (<30%) Volume: 68mL Perfusion (Tmax>6.0s) volume: 43mL Mismatch Volume: 26mL Infarction Location:Delayed perfusion is noted in the left temporal lobe and right frontotemporal lobe. This could be due to ischemia however the pattern is somewhat atypical and this could be artifact. IMPRESSION: 1. 28 ml of delayed perfusion in the temporal lobes bilaterally. Favor artifact over ischemia. Recommend MRI to evaluate for infarct. 2. 50% diameter stenosis proximal right internal carotid artery with 4 mm plaque ulceration. No thrombus in the vessel. 3. Atherosclerotic disease left carotid bifurcation without significant stenosis 4. No significant vertebral artery stenosis 5. No significant intracranial stenosis or large vessel occlusion. Electronically Signed   By: Franchot Gallo M.D.   On: 09/14/2019 15:49   DG Chest 1 View  Result Date: 09/14/2019 CLINICAL DATA:  Aphasia EXAM: CHEST  1 VIEW COMPARISON:  Chest radiograph 05/04/2019 FINDINGS: Stable cardiomediastinal contours. Left chest ICD remains in place. There is mild coarsening of the interstitium bilaterally likely representing  chronic changes. No focal infiltrate. No pneumothorax or large pleural effusion. No acute finding in the visualized skeleton. IMPRESSION: Mild coarsening of the interstitium bilaterally favored represent chronic changes. No evidence of active disease. Electronically Signed   By: Audie Pinto M.D.   On: 09/14/2019 15:50   CT Angio Neck W and/or Wo Contrast  Result Date: 09/14/2019 CLINICAL DATA:  Altered mental status.  Rule out stroke. EXAM: CT ANGIOGRAPHY HEAD AND NECK CT PERFUSION BRAIN TECHNIQUE: Multidetector CT imaging of the head and neck was performed using the standard protocol during bolus administration of intravenous contrast. Multiplanar CT image reconstructions and MIPs were obtained to evaluate the vascular anatomy. Carotid stenosis measurements (when applicable) are obtained utilizing NASCET criteria, using the distal internal carotid diameter as  the denominator. Multiphase CT imaging of the brain was performed following IV bolus contrast injection. Subsequent parametric perfusion maps were calculated using RAPID software. CONTRAST:  148mL OMNIPAQUE IOHEXOL 350 MG/ML SOLN COMPARISON:  CT head 09/14/2019 FINDINGS: CTA NECK FINDINGS Aortic arch: Incomplete evaluation of the arch. Atherosclerotic disease in the aortic arch. Origins of the innominate and left carotid artery are not imaged. Left subclavian artery is patent proximally. Right carotid system: Right common carotid artery patent. Atherosclerotic disease in the proximal right internal carotid artery. Ulcerated plaque. Ulcer measures approximately 4 mm in diameter and projects posteriorly. No thrombus in the ulcer. Approximately 50% diameter stenosis proximal right internal carotid artery. Left carotid system: Left common carotid artery widely patent. Atherosclerotic disease left carotid bifurcation without significant stenosis. Vertebral arteries: Left vertebral artery dominant. Both vertebral arteries patent to the basilar. Skeleton:  Cervical spondylosis.  No acute skeletal abnormality. Other neck: Negative for mass or adenopathy in the neck. Upper chest: Lung apices clear bilaterally. Review of the MIP images confirms the above findings CTA HEAD FINDINGS Anterior circulation: Atherosclerotic calcification in the cavernous carotid bilaterally without significant stenosis. Anterior and middle cerebral arteries patent bilaterally. Tortuosity of these vessels especially the anterior cerebral arteries. Posterior circulation: Both vertebral arteries patent to the basilar. PICA patent bilaterally. Basilar widely patent. AICA, superior cerebellar, posterior cerebral arteries patent bilaterally without stenosis or occlusion Venous sinuses: Normal venous enhancement Anatomic variants: None Review of the MIP images confirms the above findings CT Brain Perfusion Findings: ASPECTS: 10 CBF (<30%) Volume: 68mL Perfusion (Tmax>6.0s) volume: 25mL Mismatch Volume: 57mL Infarction Location:Delayed perfusion is noted in the left temporal lobe and right frontotemporal lobe. This could be due to ischemia however the pattern is somewhat atypical and this could be artifact. IMPRESSION: 1. 28 ml of delayed perfusion in the temporal lobes bilaterally. Favor artifact over ischemia. Recommend MRI to evaluate for infarct. 2. 50% diameter stenosis proximal right internal carotid artery with 4 mm plaque ulceration. No thrombus in the vessel. 3. Atherosclerotic disease left carotid bifurcation without significant stenosis 4. No significant vertebral artery stenosis 5. No significant intracranial stenosis or large vessel occlusion. Electronically Signed   By: Franchot Gallo M.D.   On: 09/14/2019 15:49   CT CEREBRAL PERFUSION W CONTRAST  Result Date: 09/14/2019 CLINICAL DATA:  Altered mental status.  Rule out stroke. EXAM: CT ANGIOGRAPHY HEAD AND NECK CT PERFUSION BRAIN TECHNIQUE: Multidetector CT imaging of the head and neck was performed using the standard protocol during  bolus administration of intravenous contrast. Multiplanar CT image reconstructions and MIPs were obtained to evaluate the vascular anatomy. Carotid stenosis measurements (when applicable) are obtained utilizing NASCET criteria, using the distal internal carotid diameter as the denominator. Multiphase CT imaging of the brain was performed following IV bolus contrast injection. Subsequent parametric perfusion maps were calculated using RAPID software. CONTRAST:  178mL OMNIPAQUE IOHEXOL 350 MG/ML SOLN COMPARISON:  CT head 09/14/2019 FINDINGS: CTA NECK FINDINGS Aortic arch: Incomplete evaluation of the arch. Atherosclerotic disease in the aortic arch. Origins of the innominate and left carotid artery are not imaged. Left subclavian artery is patent proximally. Right carotid system: Right common carotid artery patent. Atherosclerotic disease in the proximal right internal carotid artery. Ulcerated plaque. Ulcer measures approximately 4 mm in diameter and projects posteriorly. No thrombus in the ulcer. Approximately 50% diameter stenosis proximal right internal carotid artery. Left carotid system: Left common carotid artery widely patent. Atherosclerotic disease left carotid bifurcation without significant stenosis. Vertebral arteries: Left vertebral artery dominant. Both  vertebral arteries patent to the basilar. Skeleton: Cervical spondylosis.  No acute skeletal abnormality. Other neck: Negative for mass or adenopathy in the neck. Upper chest: Lung apices clear bilaterally. Review of the MIP images confirms the above findings CTA HEAD FINDINGS Anterior circulation: Atherosclerotic calcification in the cavernous carotid bilaterally without significant stenosis. Anterior and middle cerebral arteries patent bilaterally. Tortuosity of these vessels especially the anterior cerebral arteries. Posterior circulation: Both vertebral arteries patent to the basilar. PICA patent bilaterally. Basilar widely patent. AICA, superior  cerebellar, posterior cerebral arteries patent bilaterally without stenosis or occlusion Venous sinuses: Normal venous enhancement Anatomic variants: None Review of the MIP images confirms the above findings CT Brain Perfusion Findings: ASPECTS: 10 CBF (<30%) Volume: 28mL Perfusion (Tmax>6.0s) volume: 52mL Mismatch Volume: 88mL Infarction Location:Delayed perfusion is noted in the left temporal lobe and right frontotemporal lobe. This could be due to ischemia however the pattern is somewhat atypical and this could be artifact. IMPRESSION: 1. 28 ml of delayed perfusion in the temporal lobes bilaterally. Favor artifact over ischemia. Recommend MRI to evaluate for infarct. 2. 50% diameter stenosis proximal right internal carotid artery with 4 mm plaque ulceration. No thrombus in the vessel. 3. Atherosclerotic disease left carotid bifurcation without significant stenosis 4. No significant vertebral artery stenosis 5. No significant intracranial stenosis or large vessel occlusion. Electronically Signed   By: Franchot Gallo M.D.   On: 09/14/2019 15:49   ECHOCARDIOGRAM COMPLETE  Result Date: 09/15/2019   ECHOCARDIOGRAM REPORT   Patient Name:   Phillip Taylor Date of Exam: 09/15/2019 Medical Rec #:  [id number]    Height:       72.0 in Accession #:    [id number]   Weight:       134.7 lb Date of Birth:  06/22/1925     BSA:          1.80 m Patient Age:    30 years     BP:           160/76 mmHg Patient Gender: M            HR:           85 bpm. Exam Location:  [facility] Procedure: 2D Echo Indications:    Stroke 434.91 / I163.9  History:        Patient has prior history of Echocardiogram examinations, most                 recent 05/05/2019. Arrythmias:Atrial Fibrillation; Risk                 Factors:Non-Smoker and Hypertension. Pulmonary embolism ,.  Sonographer:    Leavy Cella RDCS (AE) Referring Phys: [place]  1. Left ventricular ejection fraction, by visual estimation, is 60 to 65%. The left  ventricle has normal function. Left ventricular septal wall thickness was severely increased. There is mildly increased left ventricular hypertrophy.  2. Left ventricular diastolic function could not be evaluated.  3. The left ventricle has no regional wall motion abnormalities.  4. Global right ventricle has normal systolic function.The right ventricular size is normal. No increase in right ventricular wall thickness.  5. Left atrial size was normal.  6. Right atrial size was normal.  7. The mitral valve is grossly normal. Trivial mitral valve regurgitation.  8. The tricuspid valve is not well visualized. Tricuspid valve regurgitation is trivial.  9. The aortic valve is tricuspid. Aortic valve regurgitation is trivial. Mild aortic valve sclerosis without stenosis.  10. The pulmonic valve was grossly normal. Pulmonic valve regurgitation is not visualized. 11. The inferior vena cava is normal in size with greater than 50% respiratory variability, suggesting right atrial pressure of 3 mmHg. 12. The interatrial septum was not assessed. FINDINGS  Left Ventricle: Left ventricular ejection fraction, by visual estimation, is 60 to 65%. The left ventricle has normal function. The left ventricle has no regional wall motion abnormalities. There is mildly increased left ventricular hypertrophy. The left ventricular diastology could not be evaluated due to atrial fibrillation. Left ventricular diastolic function could not be evaluated. Right Ventricle: The right ventricular size is normal. No increase in right ventricular wall thickness. Global RV systolic function is has normal systolic function. Left Atrium: Left atrial size was normal in size. Right Atrium: Right atrial size was normal in size Pericardium: There is no evidence of pericardial effusion. Mitral Valve: The mitral valve is grossly normal. Trivial mitral valve regurgitation. Tricuspid Valve: The tricuspid valve is not well visualized. Tricuspid valve regurgitation  is trivial. Aortic Valve: The aortic valve is tricuspid. Aortic valve regurgitation is trivial. Mild aortic valve sclerosis is present, with no evidence of aortic valve stenosis. Pulmonic Valve: The pulmonic valve was grossly normal. Pulmonic valve regurgitation is not visualized. Pulmonic regurgitation is not visualized. Aorta: The aortic root and ascending aorta are structurally normal, with no evidence of dilitation. Venous: The inferior vena cava is normal in size with greater than 50% respiratory variability, suggesting right atrial pressure of 3 mmHg. IAS/Shunts: The interatrial septum was not assessed.  LEFT VENTRICLE PLAX 2D LVIDd:         2.51 cm  Diastology LVIDs:         1.94 cm  LV e' lateral:   4.79 cm/s LV PW:         1.13 cm  LV E/e' lateral: 25.5 LV IVS:        1.37 cm  LV e' medial:    4.68 cm/s LVOT diam:     2.20 cm  LV E/e' medial:  26.1 LV SV:         11 ml LV SV Index:   6.16 LVOT Area:     3.80 cm  RIGHT VENTRICLE RV S prime:     17.10 cm/s TAPSE (M-mode): 2.3 cm LEFT ATRIUM             Index       RIGHT ATRIUM           Index LA diam:        3.00 cm 1.67 cm/m  RA Area:     11.30 cm LA Vol (A2C):   33.3 ml 18.49 ml/m RA Volume:   29.90 ml  16.60 ml/m LA Vol (A4C):   48.9 ml 27.15 ml/m LA Biplane Vol: 43.5 ml 24.15 ml/m   AORTA Ao Root diam: 2.90 cm MITRAL VALVE MV Area (PHT): 6.39 cm             SHUNTS MV PHT:        34.41 msec           Systemic Diam: 2.20 cm MV Decel Time: 119 msec MV E velocity: 122.00 cm/s 103 cm/s  Lyman Bishop MD Electronically signed by Lyman Bishop MD Signature Date/Time: 09/15/2019/11:33:28 AM    Final    CT HEAD CODE STROKE WO CONTRAST`  Result Date: 09/14/2019 CLINICAL DATA:  Code stroke.  Altered mental status EXAM: CT HEAD WITHOUT CONTRAST TECHNIQUE: Contiguous axial images were obtained  from the base of the skull through the vertex without intravenous contrast. COMPARISON:  CT head 05/04/2019 FINDINGS: Brain: Moderate atrophy and moderate chronic  microvascular ischemic changes in the white matter are stable. No acute infarct, hemorrhage, mass. No fluid collection or midline shift. Vascular: Negative for hyperdense vessel Skull: Negative Sinuses/Orbits: Mild mucosal edema in the frontal sinus. Remaining sinuses clear. Negative orbit. Other: None ASPECTS ([place] Stroke Program Early CT Score) - Ganglionic level infarction (caudate, lentiform nuclei, internal capsule, insula, M1-M3 cortex): 7 - Supraganglionic infarction (M4-M6 cortex): 3 Total score (0-10 with 10 being normal): 10 IMPRESSION: 1. No acute abnormality 2. ASPECTS is 10 3. Atrophy and chronic microvascular ischemic changes stable from the prior study. 4. These results were called by telephone at the time of interpretation on 09/14/2019 at 3:01 pm to provider Rancour MD, who verbally acknowledged these results. Electronically Signed   By: Franchot Gallo M.D.   On: 09/14/2019 15:01    Assessment: 85 y.o. male with dementia and atrial fibrillation on Eliquis, presenting with AMS. Evaluated for possible emergent stroke at OSH then transferred to [facility] for further assessment. CT head showed no acute abnormality; chronic microvascular ischemic changes and atrophy were noted. CTA showed no LVO. CTP with findings most c/w artifact.  1. Exam is more consistent with cognitive impairment than stroke. DDx includes toxic/metabolic and infectious.  2. TTE with EF of 60-65%. No mural thrombus seen.  3. Stroke Risk Factors - Atrial fibrillation, history of PE, carotid stenosis and HTN 4. Elevated BUN:Cr ratio, consistent with volume depletion.   Recommendations: 1. HgbA1c, fasting lipid panel 2. Per radiology technician's note, the patient's pacmaker is MRI compatible but old MRI unsafe leads from 2008 are still in, so patient will not be able to have an MRI scan 3. PT consult, OT consult, Speech consult 4. IV hydration 5. Continue Eliquis 6. Consider a trial of Aricept 7. Telemetry monitoring 8.  Frequent neuro checks 9. BP management. Out of permissive HTN time window.   @Electronically  signed: Dr. Kerney Elbe 09/15/2019, 9:36 PM

## 2019-09-15 NOTE — Progress Notes (Signed)
Pt has a [employer] MRI pacemaker generator, Model [id number], but they left his old MRI UNSAFE leads in from 2008, so unfortunately he does not have a COMPLETE MRI READY pacing system, so he CANNOT have his MRI. RN aware and to let Dr. know  UNSAFE LEADS: RA lead model #1699TC Optisense (R) RV lead model # 1788TC Tendril (R) ST

## 2019-09-15 NOTE — Progress Notes (Signed)
PROGRESS NOTE    Phillip Taylor  [id number] DOB: March 28, 1925 DOA: 09/14/2019 PCP: [name], MD    Assessment & Plan:   Active Problems:   Speech abnormality   Phillip Taylor is a 85 y.o. Caucasian male with medical history significant for dementia, pulmonary embolism, carotid artery stenosis, atrial fibrillation, CKD 3, BPH, hypertension, vocal cord cancer.  Patient was brought to the ED from [place] with reports of altered mental status, patient not communicating and having trouble ambulating.   Speech abnormality/difficulty walking Baseline dementia --Speech difficulty resolved on arrival to ED.  Stat head CT negative for acute abnormality. CT perfusion study showed "28 ml of delayed perfusion in the temporal lobes bilaterally. Favor artifact over ischemia."  No significant vertebral artery stenosis.  No significant intracranial stenosis or large vessel occlusion. --Evaluated by tele-neurology --Transferred to [facility] for MRI and neurology evaluation  --Failed bedside swallow evaluation --LDL 86, A1c wnl PLAN: --Obtain MRI brain at [facility] --Consult neurology at [facility] --Echocardiogram --N.p.o. for now, continue D5 Ns 75cc/hr  --PT, OT, speech therapy evaluation --Frequent neurochecks --Anticoagulation with Eliquis held for now  Fever -100.8 in ED.  Nursing home resident.  UA unremarkable.  Portable chest x-ray chronic findings.  WBC 5.  Does not meet SIRS criteria.  No focus of infection identified at this time.  COVID neg. --Pt was started on aztreonam and vancomycin prophylactically on admission. PLAN: --d/c abx since no etiology of infection --f/u blood and urine cx  History of pulmonary embolism Hx of atrial fibrillation-rate controlled  On chronic anticoagulation -Hold Home carvedilol 3.125 and Eliquis while n.p.o.   Hypothyroidism  - Hold home Synthroid while NPO  History of dementia with behavioral disturbance -nursing home  resident, at baseline he communicates, ambulates with a wheelchair.  BPH-stable.   Not on medication.  History of erosive gastropathy/duodenal ulcer -Resume PPI as IV Protonix daily  DVT prophylaxis: Lovenox for now-resume home Eliquis when able Code Status: DNR Family Communication: None at bedside Disposition Plan: Per rounding team Consults called: Please consult neurology in a.m. Admission status: Observation, telemetry   Subjective and Interval History:  Pt failed a bedside swallow, so was kept NPO and on MIVF.  Follows simple commands but can not answer any ROS questions.   Objective: Vitals:   09/15/19 1030 09/15/19 1100 09/15/19 1130 09/15/19 1200  BP: (!) 151/80 (!) 156/89 (!) 149/86 (!) 163/95  Pulse: 88 90 87 94  Resp: 18 18 15 15   Temp:      TempSrc:      SpO2: 97% 97% 96% 98%  Weight:      Height:        Intake/Output Summary (Last 24 hours) at 09/15/2019 1245 Last data filed at 09/15/2019 0410 Gross per 24 hour  Intake 1100 ml  Output --  Net 1100 ml   Filed Weights   09/14/19 1319  Weight: 61.1 kg    Physical Examination:  Constitutional: NAD, sleepy but arousable, oriented to self HEENT: conjunctivae and lids normal, EOMI CV: RRR no M,R,G. Distal pulses +2.  No cyanosis.   RESP: CTA B/L, normal respiratory effort  GI: +BS, NTND Extremities: No effusions, edema, or tenderness in BLE MSK: left arm weaker than right, can not hold it up SKIN: warm, dry and intact Neuro: II - XII grossly intact.  Sensation intact   Data Reviewed: I have personally reviewed following labs and imaging studies  CBC: Recent Labs  Lab 09/14/19 1415 09/15/19  0439  WBC 5.0 4.5  HGB 15.7 13.4  HCT 47.5 40.8  MCV 98.1 98.3  PLT 148* [id number]*   Basic Metabolic Panel: Recent Labs  Lab 09/14/19 1415 09/15/19 0439  NA 138 138  K 4.3 3.5  CL 103 105  CO2 23 23  GLUCOSE 131* 130*  BUN 34* 25*  CREATININE 1.15 0.95  CALCIUM 8.7* 8.0*   GFR: Estimated  Creatinine Clearance: 42.9 mL/min (by C-G formula based on SCr of 0.95 mg/dL). Liver Function Tests: Recent Labs  Lab 09/14/19 1415  AST 71*  ALT 41  ALKPHOS 162*  BILITOT 1.3*  PROT 7.8  ALBUMIN 3.9   No results for input(s): LIPASE, AMYLASE in the last 168 hours. No results for input(s): AMMONIA in the last 168 hours. Coagulation Profile: Recent Labs  Lab 09/14/19 1421  INR 1.2   Cardiac Enzymes: No results for input(s): CKTOTAL, CKMB, CKMBINDEX, TROPONINI in the last 168 hours. BNP (last 3 results) No results for input(s): PROBNP in the last 8760 hours. HbA1C: Recent Labs    09/15/19 0439  HGBA1C 5.6   CBG: Recent Labs  Lab 09/14/19 1358  GLUCAP 129*   Lipid Profile: Recent Labs    09/15/19 0439  CHOL 135  HDL 25*  LDLCALC 86  TRIG 121  CHOLHDL 5.4   Thyroid Function Tests: Recent Labs    09/14/19 1415  TSH 3.385   Anemia Panel: No results for input(s): VITAMINB12, FOLATE, FERRITIN, TIBC, IRON, RETICCTPCT in the last 72 hours. Sepsis Labs: Recent Labs  Lab 09/14/19 1431 09/14/19 1636  LATICACIDVEN 2.2* 1.6    Recent Results (from the past 240 hour(s))  Blood culture (routine x 2)     Status: None (Preliminary result)   Collection Time: 09/14/19  1:48 PM   Specimen: Right Antecubital; Blood  Result Value Ref Range Status   Specimen Description   Final    RIGHT ANTECUBITAL BOTTLES DRAWN AEROBIC AND ANAEROBIC   Special Requests   Final    Blood Culture adequate volume Performed at [facility], [address] [place], [place] [address]    Culture PENDING  Incomplete   Report Status PENDING  Incomplete  Blood culture (routine x 2)     Status: None (Preliminary result)   Collection Time: 09/14/19  2:15 PM   Specimen: Right Antecubital; Blood  Result Value Ref Range Status   Specimen Description RIGHT ANTECUBITAL BOTTLES DRAWN AEROBIC ONLY  Final   Special Requests   Final    Blood Culture adequate volume Performed at [facility],  [address] [place], [place] [address]    Culture PENDING  Incomplete   Report Status PENDING  Incomplete  SARS CORONAVIRUS 2 (TAT 6-24 HRS) Nasopharyngeal Nasopharyngeal Swab     Status: None   Collection Time: 09/14/19  4:06 PM   Specimen: Nasopharyngeal Swab  Result Value Ref Range Status   SARS Coronavirus 2 NEGATIVE NEGATIVE Final    Comment: (NOTE) SARS-CoV-2 target nucleic acids are NOT DETECTED. The SARS-CoV-2 RNA is generally detectable in upper and lower respiratory specimens during the acute phase of infection. Negative results do not preclude SARS-CoV-2 infection, do not rule out co-infections with other pathogens, and should not be used as the sole basis for treatment or other patient management decisions. Negative results must be combined with clinical observations, patient history, and epidemiological information. The expected result is Negative. Fact Sheet for Patients: SugarRoll.be Fact Sheet for Healthcare Providers: https://www.woods-mathews.com/ This test is not yet approved or cleared  by the [place] and  has been authorized for detection and/or diagnosis of SARS-CoV-2 by FDA under an Emergency Use Authorization (EUA). This EUA will remain  in effect (meaning this test can be used) for the duration of the COVID-19 declaration under Section 56 4(b)(1) of the Act, 21 U.S.C. section 360bbb-3(b)(1), unless the authorization is terminated or revoked sooner. Performed at [facility], [place] [address], [place], [place] [address]       Radiology Studies: CT Angio Head W or Wo Contrast  Result Date: 09/14/2019 CLINICAL DATA:  Altered mental status.  Rule out stroke. EXAM: CT ANGIOGRAPHY HEAD AND NECK CT PERFUSION BRAIN TECHNIQUE: Multidetector CT imaging of the head and neck was performed using the standard protocol during bolus administration of intravenous contrast. Multiplanar CT image reconstructions and MIPs  were obtained to evaluate the vascular anatomy. Carotid stenosis measurements (when applicable) are obtained utilizing NASCET criteria, using the distal internal carotid diameter as the denominator. Multiphase CT imaging of the brain was performed following IV bolus contrast injection. Subsequent parametric perfusion maps were calculated using RAPID software. CONTRAST:  18mL OMNIPAQUE IOHEXOL 350 MG/ML SOLN COMPARISON:  CT head 09/14/2019 FINDINGS: CTA NECK FINDINGS Aortic arch: Incomplete evaluation of the arch. Atherosclerotic disease in the aortic arch. Origins of the innominate and left carotid artery are not imaged. Left subclavian artery is patent proximally. Right carotid system: Right common carotid artery patent. Atherosclerotic disease in the proximal right internal carotid artery. Ulcerated plaque. Ulcer measures approximately 4 mm in diameter and projects posteriorly. No thrombus in the ulcer. Approximately 50% diameter stenosis proximal right internal carotid artery. Left carotid system: Left common carotid artery widely patent. Atherosclerotic disease left carotid bifurcation without significant stenosis. Vertebral arteries: Left vertebral artery dominant. Both vertebral arteries patent to the basilar. Skeleton: Cervical spondylosis.  No acute skeletal abnormality. Other neck: Negative for mass or adenopathy in the neck. Upper chest: Lung apices clear bilaterally. Review of the MIP images confirms the above findings CTA HEAD FINDINGS Anterior circulation: Atherosclerotic calcification in the cavernous carotid bilaterally without significant stenosis. Anterior and middle cerebral arteries patent bilaterally. Tortuosity of these vessels especially the anterior cerebral arteries. Posterior circulation: Both vertebral arteries patent to the basilar. PICA patent bilaterally. Basilar widely patent. AICA, superior cerebellar, posterior cerebral arteries patent bilaterally without stenosis or occlusion Venous  sinuses: Normal venous enhancement Anatomic variants: None Review of the MIP images confirms the above findings CT Brain Perfusion Findings: ASPECTS: 10 CBF (<30%) Volume: 7mL Perfusion (Tmax>6.0s) volume: 32mL Mismatch Volume: 37mL Infarction Location:Delayed perfusion is noted in the left temporal lobe and right frontotemporal lobe. This could be due to ischemia however the pattern is somewhat atypical and this could be artifact. IMPRESSION: 1. 28 ml of delayed perfusion in the temporal lobes bilaterally. Favor artifact over ischemia. Recommend MRI to evaluate for infarct. 2. 50% diameter stenosis proximal right internal carotid artery with 4 mm plaque ulceration. No thrombus in the vessel. 3. Atherosclerotic disease left carotid bifurcation without significant stenosis 4. No significant vertebral artery stenosis 5. No significant intracranial stenosis or large vessel occlusion. Electronically Signed   By: Franchot Gallo M.D.   On: 09/14/2019 15:49   DG Chest 1 View  Result Date: 09/14/2019 CLINICAL DATA:  Aphasia EXAM: CHEST  1 VIEW COMPARISON:  Chest radiograph 05/04/2019 FINDINGS: Stable cardiomediastinal contours. Left chest ICD remains in place. There is mild coarsening of the interstitium bilaterally likely representing chronic changes. No focal infiltrate. No pneumothorax or large pleural  effusion. No acute finding in the visualized skeleton. IMPRESSION: Mild coarsening of the interstitium bilaterally favored represent chronic changes. No evidence of active disease. Electronically Signed   By: Audie Pinto M.D.   On: 09/14/2019 15:50   CT Angio Neck W and/or Wo Contrast  Result Date: 09/14/2019 CLINICAL DATA:  Altered mental status.  Rule out stroke. EXAM: CT ANGIOGRAPHY HEAD AND NECK CT PERFUSION BRAIN TECHNIQUE: Multidetector CT imaging of the head and neck was performed using the standard protocol during bolus administration of intravenous contrast. Multiplanar CT image reconstructions and  MIPs were obtained to evaluate the vascular anatomy. Carotid stenosis measurements (when applicable) are obtained utilizing NASCET criteria, using the distal internal carotid diameter as the denominator. Multiphase CT imaging of the brain was performed following IV bolus contrast injection. Subsequent parametric perfusion maps were calculated using RAPID software. CONTRAST:  146mL OMNIPAQUE IOHEXOL 350 MG/ML SOLN COMPARISON:  CT head 09/14/2019 FINDINGS: CTA NECK FINDINGS Aortic arch: Incomplete evaluation of the arch. Atherosclerotic disease in the aortic arch. Origins of the innominate and left carotid artery are not imaged. Left subclavian artery is patent proximally. Right carotid system: Right common carotid artery patent. Atherosclerotic disease in the proximal right internal carotid artery. Ulcerated plaque. Ulcer measures approximately 4 mm in diameter and projects posteriorly. No thrombus in the ulcer. Approximately 50% diameter stenosis proximal right internal carotid artery. Left carotid system: Left common carotid artery widely patent. Atherosclerotic disease left carotid bifurcation without significant stenosis. Vertebral arteries: Left vertebral artery dominant. Both vertebral arteries patent to the basilar. Skeleton: Cervical spondylosis.  No acute skeletal abnormality. Other neck: Negative for mass or adenopathy in the neck. Upper chest: Lung apices clear bilaterally. Review of the MIP images confirms the above findings CTA HEAD FINDINGS Anterior circulation: Atherosclerotic calcification in the cavernous carotid bilaterally without significant stenosis. Anterior and middle cerebral arteries patent bilaterally. Tortuosity of these vessels especially the anterior cerebral arteries. Posterior circulation: Both vertebral arteries patent to the basilar. PICA patent bilaterally. Basilar widely patent. AICA, superior cerebellar, posterior cerebral arteries patent bilaterally without stenosis or occlusion  Venous sinuses: Normal venous enhancement Anatomic variants: None Review of the MIP images confirms the above findings CT Brain Perfusion Findings: ASPECTS: 10 CBF (<30%) Volume: 35mL Perfusion (Tmax>6.0s) volume: 19mL Mismatch Volume: 12mL Infarction Location:Delayed perfusion is noted in the left temporal lobe and right frontotemporal lobe. This could be due to ischemia however the pattern is somewhat atypical and this could be artifact. IMPRESSION: 1. 28 ml of delayed perfusion in the temporal lobes bilaterally. Favor artifact over ischemia. Recommend MRI to evaluate for infarct. 2. 50% diameter stenosis proximal right internal carotid artery with 4 mm plaque ulceration. No thrombus in the vessel. 3. Atherosclerotic disease left carotid bifurcation without significant stenosis 4. No significant vertebral artery stenosis 5. No significant intracranial stenosis or large vessel occlusion. Electronically Signed   By: Franchot Gallo M.D.   On: 09/14/2019 15:49   CT CEREBRAL PERFUSION W CONTRAST  Result Date: 09/14/2019 CLINICAL DATA:  Altered mental status.  Rule out stroke. EXAM: CT ANGIOGRAPHY HEAD AND NECK CT PERFUSION BRAIN TECHNIQUE: Multidetector CT imaging of the head and neck was performed using the standard protocol during bolus administration of intravenous contrast. Multiplanar CT image reconstructions and MIPs were obtained to evaluate the vascular anatomy. Carotid stenosis measurements (when applicable) are obtained utilizing NASCET criteria, using the distal internal carotid diameter as the denominator. Multiphase CT imaging of the brain was performed following IV bolus contrast injection. Subsequent parametric  perfusion maps were calculated using RAPID software. CONTRAST:  136mL OMNIPAQUE IOHEXOL 350 MG/ML SOLN COMPARISON:  CT head 09/14/2019 FINDINGS: CTA NECK FINDINGS Aortic arch: Incomplete evaluation of the arch. Atherosclerotic disease in the aortic arch. Origins of the innominate and left  carotid artery are not imaged. Left subclavian artery is patent proximally. Right carotid system: Right common carotid artery patent. Atherosclerotic disease in the proximal right internal carotid artery. Ulcerated plaque. Ulcer measures approximately 4 mm in diameter and projects posteriorly. No thrombus in the ulcer. Approximately 50% diameter stenosis proximal right internal carotid artery. Left carotid system: Left common carotid artery widely patent. Atherosclerotic disease left carotid bifurcation without significant stenosis. Vertebral arteries: Left vertebral artery dominant. Both vertebral arteries patent to the basilar. Skeleton: Cervical spondylosis.  No acute skeletal abnormality. Other neck: Negative for mass or adenopathy in the neck. Upper chest: Lung apices clear bilaterally. Review of the MIP images confirms the above findings CTA HEAD FINDINGS Anterior circulation: Atherosclerotic calcification in the cavernous carotid bilaterally without significant stenosis. Anterior and middle cerebral arteries patent bilaterally. Tortuosity of these vessels especially the anterior cerebral arteries. Posterior circulation: Both vertebral arteries patent to the basilar. PICA patent bilaterally. Basilar widely patent. AICA, superior cerebellar, posterior cerebral arteries patent bilaterally without stenosis or occlusion Venous sinuses: Normal venous enhancement Anatomic variants: None Review of the MIP images confirms the above findings CT Brain Perfusion Findings: ASPECTS: 10 CBF (<30%) Volume: 57mL Perfusion (Tmax>6.0s) volume: 44mL Mismatch Volume: 48mL Infarction Location:Delayed perfusion is noted in the left temporal lobe and right frontotemporal lobe. This could be due to ischemia however the pattern is somewhat atypical and this could be artifact. IMPRESSION: 1. 28 ml of delayed perfusion in the temporal lobes bilaterally. Favor artifact over ischemia. Recommend MRI to evaluate for infarct. 2. 50% diameter  stenosis proximal right internal carotid artery with 4 mm plaque ulceration. No thrombus in the vessel. 3. Atherosclerotic disease left carotid bifurcation without significant stenosis 4. No significant vertebral artery stenosis 5. No significant intracranial stenosis or large vessel occlusion. Electronically Signed   By: Franchot Gallo M.D.   On: 09/14/2019 15:49   ECHOCARDIOGRAM COMPLETE  Result Date: 09/15/2019   ECHOCARDIOGRAM REPORT   Patient Name:   Darl KANSAS WALD Date of Exam: 09/15/2019 Medical Rec #:  [id number]    Height:       72.0 in Accession #:    [id number]   Weight:       134.7 lb Date of Birth:  01/02/1925     BSA:          1.80 m Patient Age:    17 years     BP:           160/76 mmHg Patient Gender: M            HR:           85 bpm. Exam Location:  [facility] Procedure: 2D Echo Indications:    Stroke 434.91 / I163.9  History:        Patient has prior history of Echocardiogram examinations, most                 recent 05/05/2019. Arrythmias:Atrial Fibrillation; Risk                 Factors:Non-Smoker and Hypertension. Pulmonary embolism ,.  Sonographer:    Leavy Cella RDCS (AE) Referring Phys: [place]  1. Left ventricular ejection fraction, by visual estimation, is 60 to 65%.  The left ventricle has normal function. Left ventricular septal wall thickness was severely increased. There is mildly increased left ventricular hypertrophy.  2. Left ventricular diastolic function could not be evaluated.  3. The left ventricle has no regional wall motion abnormalities.  4. Global right ventricle has normal systolic function.The right ventricular size is normal. No increase in right ventricular wall thickness.  5. Left atrial size was normal.  6. Right atrial size was normal.  7. The mitral valve is grossly normal. Trivial mitral valve regurgitation.  8. The tricuspid valve is not well visualized. Tricuspid valve regurgitation is trivial.  9. The aortic valve is tricuspid.  Aortic valve regurgitation is trivial. Mild aortic valve sclerosis without stenosis. 10. The pulmonic valve was grossly normal. Pulmonic valve regurgitation is not visualized. 11. The inferior vena cava is normal in size with greater than 50% respiratory variability, suggesting right atrial pressure of 3 mmHg. 12. The interatrial septum was not assessed. FINDINGS  Left Ventricle: Left ventricular ejection fraction, by visual estimation, is 60 to 65%. The left ventricle has normal function. The left ventricle has no regional wall motion abnormalities. There is mildly increased left ventricular hypertrophy. The left ventricular diastology could not be evaluated due to atrial fibrillation. Left ventricular diastolic function could not be evaluated. Right Ventricle: The right ventricular size is normal. No increase in right ventricular wall thickness. Global RV systolic function is has normal systolic function. Left Atrium: Left atrial size was normal in size. Right Atrium: Right atrial size was normal in size Pericardium: There is no evidence of pericardial effusion. Mitral Valve: The mitral valve is grossly normal. Trivial mitral valve regurgitation. Tricuspid Valve: The tricuspid valve is not well visualized. Tricuspid valve regurgitation is trivial. Aortic Valve: The aortic valve is tricuspid. Aortic valve regurgitation is trivial. Mild aortic valve sclerosis is present, with no evidence of aortic valve stenosis. Pulmonic Valve: The pulmonic valve was grossly normal. Pulmonic valve regurgitation is not visualized. Pulmonic regurgitation is not visualized. Aorta: The aortic root and ascending aorta are structurally normal, with no evidence of dilitation. Venous: The inferior vena cava is normal in size with greater than 50% respiratory variability, suggesting right atrial pressure of 3 mmHg. IAS/Shunts: The interatrial septum was not assessed.  LEFT VENTRICLE PLAX 2D LVIDd:         2.51 cm  Diastology LVIDs:          1.94 cm  LV e' lateral:   4.79 cm/s LV PW:         1.13 cm  LV E/e' lateral: 25.5 LV IVS:        1.37 cm  LV e' medial:    4.68 cm/s LVOT diam:     2.20 cm  LV E/e' medial:  26.1 LV SV:         11 ml LV SV Index:   6.16 LVOT Area:     3.80 cm  RIGHT VENTRICLE RV S prime:     17.10 cm/s TAPSE (M-mode): 2.3 cm LEFT ATRIUM             Index       RIGHT ATRIUM           Index LA diam:        3.00 cm 1.67 cm/m  RA Area:     11.30 cm LA Vol (A2C):   33.3 ml 18.49 ml/m RA Volume:   29.90 ml  16.60 ml/m LA Vol (A4C):   48.9 ml 27.15 ml/m LA  Biplane Vol: 43.5 ml 24.15 ml/m   AORTA Ao Root diam: 2.90 cm MITRAL VALVE MV Area (PHT): 6.39 cm             SHUNTS MV PHT:        34.41 msec           Systemic Diam: 2.20 cm MV Decel Time: 119 msec MV E velocity: 122.00 cm/s 103 cm/s  Lyman Bishop MD Electronically signed by Lyman Bishop MD Signature Date/Time: 09/15/2019/11:33:28 AM    Final    CT HEAD CODE STROKE WO CONTRAST`  Result Date: 09/14/2019 CLINICAL DATA:  Code stroke.  Altered mental status EXAM: CT HEAD WITHOUT CONTRAST TECHNIQUE: Contiguous axial images were obtained from the base of the skull through the vertex without intravenous contrast. COMPARISON:  CT head 05/04/2019 FINDINGS: Brain: Moderate atrophy and moderate chronic microvascular ischemic changes in the white matter are stable. No acute infarct, hemorrhage, mass. No fluid collection or midline shift. Vascular: Negative for hyperdense vessel Skull: Negative Sinuses/Orbits: Mild mucosal edema in the frontal sinus. Remaining sinuses clear. Negative orbit. Other: None ASPECTS ([place] Stroke Program Early CT Score) - Ganglionic level infarction (caudate, lentiform nuclei, internal capsule, insula, M1-M3 cortex): 7 - Supraganglionic infarction (M4-M6 cortex): 3 Total score (0-10 with 10 being normal): 10 IMPRESSION: 1. No acute abnormality 2. ASPECTS is 10 3. Atrophy and chronic microvascular ischemic changes stable from the prior study. 4. These  results were called by telephone at the time of interpretation on 09/14/2019 at 3:01 pm to provider Rancour MD, who verbally acknowledged these results. Electronically Signed   By: Franchot Gallo M.D.   On: 09/14/2019 15:01     Scheduled Meds: .  stroke: mapping our early stages of recovery book   Does not apply Once  . enoxaparin (LOVENOX) injection  40 mg Subcutaneous Q24H  . pantoprazole (PROTONIX) IV  40 mg Intravenous Q24H   Continuous Infusions: . aztreonam 1 g (09/15/19 1003)  . dextrose 5 % and 0.9% NaCl 75 mL/hr at 09/14/19 2212  . vancomycin       LOS: 0 days     Enzo Bi, MD [facility] If 7PM-7AM, please contact night-coverage 09/15/2019, 12:45 PM

## 2019-09-16 ENCOUNTER — Inpatient Hospital Stay (HOSPITAL_COMMUNITY): Payer: Medicare Other

## 2019-09-16 DIAGNOSIS — R1310 Dysphagia, unspecified: Secondary | ICD-10-CM

## 2019-09-16 DIAGNOSIS — F0390 Unspecified dementia without behavioral disturbance: Secondary | ICD-10-CM

## 2019-09-16 DIAGNOSIS — I4891 Unspecified atrial fibrillation: Secondary | ICD-10-CM

## 2019-09-16 DIAGNOSIS — E039 Hypothyroidism, unspecified: Secondary | ICD-10-CM

## 2019-09-16 LAB — URINE CULTURE

## 2019-09-16 LAB — GLUCOSE, CAPILLARY: Glucose-Capillary: 112 mg/dL — ABNORMAL HIGH (ref 70–99)

## 2019-09-16 MED ORDER — APIXABAN 5 MG PO TABS
5.0000 mg | ORAL_TABLET | Freq: Two times a day (BID) | ORAL | Status: DC
  Administered 2019-09-16 – 2019-09-19 (×7): 5 mg via ORAL
  Filled 2019-09-16 (×7): qty 1

## 2019-09-16 MED ORDER — PANTOPRAZOLE SODIUM 40 MG PO TBEC
40.0000 mg | DELAYED_RELEASE_TABLET | Freq: Every day | ORAL | Status: DC
  Administered 2019-09-16 – 2019-09-19 (×4): 40 mg via ORAL
  Filled 2019-09-16 (×4): qty 1

## 2019-09-16 MED ORDER — ASPIRIN 300 MG RE SUPP: 300.0000 mg | Freq: Every day | RECTAL | Status: DC

## 2019-09-16 MED ORDER — MIRTAZAPINE 15 MG PO TABS
7.5000 mg | ORAL_TABLET | Freq: Every day | ORAL | Status: DC
  Administered 2019-09-16 – 2019-09-18 (×3): 7.5 mg via ORAL
  Filled 2019-09-16 (×3): qty 1

## 2019-09-16 MED ORDER — CARVEDILOL 3.125 MG PO TABS
3.1250 mg | ORAL_TABLET | Freq: Two times a day (BID) | ORAL | Status: DC
  Administered 2019-09-16 – 2019-09-18 (×4): 3.125 mg via ORAL
  Filled 2019-09-16 (×4): qty 1

## 2019-09-16 MED ORDER — WHITE PETROLATUM EX OINT
TOPICAL_OINTMENT | CUTANEOUS | Status: AC
  Administered 2019-09-16: 0.2
  Filled 2019-09-16: qty 28.35

## 2019-09-16 MED ORDER — ASPIRIN EC 325 MG PO TBEC
325.0000 mg | DELAYED_RELEASE_TABLET | Freq: Every day | ORAL | Status: DC
  Administered 2019-09-16: 14:00:00 325 mg via ORAL

## 2019-09-16 MED ORDER — LEVOTHYROXINE SODIUM 75 MCG PO TABS
75.0000 ug | ORAL_TABLET | Freq: Every day | ORAL | Status: DC
  Administered 2019-09-17 – 2019-09-19 (×3): 75 ug via ORAL
  Filled 2019-09-16 (×3): qty 1

## 2019-09-16 MED ORDER — VITAMIN B-12 1000 MCG PO TABS
1000.0000 ug | ORAL_TABLET | Freq: Every day | ORAL | Status: DC
  Administered 2019-09-16 – 2019-09-19 (×4): 1000 ug via ORAL
  Filled 2019-09-16 (×4): qty 1

## 2019-09-16 MED ORDER — ASPIRIN 325 MG PO TABS
ORAL_TABLET | ORAL | Status: AC
  Administered 2019-09-16: 13:00:00 325 mg
  Filled 2019-09-16: qty 1

## 2019-09-16 MED ORDER — PRAVASTATIN SODIUM 10 MG PO TABS
20.0000 mg | ORAL_TABLET | Freq: Every day | ORAL | Status: DC
  Administered 2019-09-16 – 2019-09-18 (×3): 20 mg via ORAL
  Filled 2019-09-16 (×3): qty 2

## 2019-09-16 NOTE — Progress Notes (Signed)
STROKE TEAM PROGRESS NOTE   INTERVAL HISTORY Pt sitting in chair. NT at bedside. He is awake alert, orientated to his name and age only, but not to time, place or people. Paucity of speech and hard of hearing, no focal deficit. Not able to have MRI brain, will repeat head CT.   Vitals:   09/16/19 0025 09/16/19 0414 09/16/19 0814 09/16/19 1202  BP: (!) 153/89 (!) 152/70 133/78 136/73  Pulse: 76 83 83 89  Resp: 18 18 16 14   Temp: 98.2 F (36.8 C) 98.5 F (36.9 C) 99.6 F (37.6 C) 98.4 F (36.9 C)  TempSrc: Oral Oral Oral Oral  SpO2: 99% 99% 98% 99%  Weight:      Height:        CBC:  Recent Labs  Lab 09/14/19 1415 09/15/19 0439  WBC 5.0 4.5  HGB 15.7 13.4  HCT 47.5 40.8  MCV 98.1 98.3  PLT 148* 120*    Basic Metabolic Panel:  Recent Labs  Lab 09/14/19 1415 09/15/19 0439  NA 138 138  K 4.3 3.5  CL 103 105  CO2 23 23  GLUCOSE 131* 130*  BUN 34* 25*  CREATININE 1.15 0.95  CALCIUM 8.7* 8.0*   Lipid Panel:     Component Value Date/Time   CHOL 135 09/15/2019 0439   TRIG 121 09/15/2019 0439   HDL 25 (L) 09/15/2019 0439   CHOLHDL 5.4 09/15/2019 0439   VLDL 24 09/15/2019 0439   LDLCALC 86 09/15/2019 0439   HgbA1c:  Lab Results  Component Value Date   HGBA1C 5.6 09/15/2019   Urine Drug Screen:     Component Value Date/Time   LABOPIA NONE DETECTED 09/14/2019 1421   COCAINSCRNUR NONE DETECTED 09/14/2019 1421   LABBENZ NONE DETECTED 09/14/2019 1421   AMPHETMU NONE DETECTED 09/14/2019 1421   THCU NONE DETECTED 09/14/2019 1421   LABBARB NONE DETECTED 09/14/2019 1421    Alcohol Level     Component Value Date/Time   ETH <10 09/14/2019 1421    IMAGING CT Angio Head W or Wo Contrast  Result Date: 09/14/2019 CLINICAL DATA:  Altered mental status.  Rule out stroke. EXAM: CT ANGIOGRAPHY HEAD AND NECK CT PERFUSION BRAIN TECHNIQUE: Multidetector CT imaging of the head and neck was performed using the standard protocol during bolus administration of intravenous  contrast. Multiplanar CT image reconstructions and MIPs were obtained to evaluate the vascular anatomy. Carotid stenosis measurements (when applicable) are obtained utilizing NASCET criteria, using the distal internal carotid diameter as the denominator. Multiphase CT imaging of the brain was performed following IV bolus contrast injection. Subsequent parametric perfusion maps were calculated using RAPID software. CONTRAST:  125mL OMNIPAQUE IOHEXOL 350 MG/ML SOLN COMPARISON:  CT head 09/14/2019 FINDINGS: CTA NECK FINDINGS Aortic arch: Incomplete evaluation of the arch. Atherosclerotic disease in the aortic arch. Origins of the innominate and left carotid artery are not imaged. Left subclavian artery is patent proximally. Right carotid system: Right common carotid artery patent. Atherosclerotic disease in the proximal right internal carotid artery. Ulcerated plaque. Ulcer measures approximately 4 mm in diameter and projects posteriorly. No thrombus in the ulcer. Approximately 50% diameter stenosis proximal right internal carotid artery. Left carotid system: Left common carotid artery widely patent. Atherosclerotic disease left carotid bifurcation without significant stenosis. Vertebral arteries: Left vertebral artery dominant. Both vertebral arteries patent to the basilar. Skeleton: Cervical spondylosis.  No acute skeletal abnormality. Other neck: Negative for mass or adenopathy in the neck. Upper chest: Lung apices clear bilaterally. Review of the  MIP images confirms the above findings CTA HEAD FINDINGS Anterior circulation: Atherosclerotic calcification in the cavernous carotid bilaterally without significant stenosis. Anterior and middle cerebral arteries patent bilaterally. Tortuosity of these vessels especially the anterior cerebral arteries. Posterior circulation: Both vertebral arteries patent to the basilar. PICA patent bilaterally. Basilar widely patent. AICA, superior cerebellar, posterior cerebral arteries  patent bilaterally without stenosis or occlusion Venous sinuses: Normal venous enhancement Anatomic variants: None Review of the MIP images confirms the above findings CT Brain Perfusion Findings: ASPECTS: 10 CBF (<30%) Volume: 57mL Perfusion (Tmax>6.0s) volume: 46mL Mismatch Volume: 40mL Infarction Location:Delayed perfusion is noted in the left temporal lobe and right frontotemporal lobe. This could be due to ischemia however the pattern is somewhat atypical and this could be artifact. IMPRESSION: 1. 28 ml of delayed perfusion in the temporal lobes bilaterally. Favor artifact over ischemia. Recommend MRI to evaluate for infarct. 2. 50% diameter stenosis proximal right internal carotid artery with 4 mm plaque ulceration. No thrombus in the vessel. 3. Atherosclerotic disease left carotid bifurcation without significant stenosis 4. No significant vertebral artery stenosis 5. No significant intracranial stenosis or large vessel occlusion. Electronically Signed   By: Franchot Gallo M.D.   On: 09/14/2019 15:49   DG Chest 1 View  Result Date: 09/14/2019 CLINICAL DATA:  Aphasia EXAM: CHEST  1 VIEW COMPARISON:  Chest radiograph 05/04/2019 FINDINGS: Stable cardiomediastinal contours. Left chest ICD remains in place. There is mild coarsening of the interstitium bilaterally likely representing chronic changes. No focal infiltrate. No pneumothorax or large pleural effusion. No acute finding in the visualized skeleton. IMPRESSION: Mild coarsening of the interstitium bilaterally favored represent chronic changes. No evidence of active disease. Electronically Signed   By: Audie Pinto M.D.   On: 09/14/2019 15:50   CT Angio Neck W and/or Wo Contrast  Result Date: 09/14/2019 CLINICAL DATA:  Altered mental status.  Rule out stroke. EXAM: CT ANGIOGRAPHY HEAD AND NECK CT PERFUSION BRAIN TECHNIQUE: Multidetector CT imaging of the head and neck was performed using the standard protocol during bolus administration of  intravenous contrast. Multiplanar CT image reconstructions and MIPs were obtained to evaluate the vascular anatomy. Carotid stenosis measurements (when applicable) are obtained utilizing NASCET criteria, using the distal internal carotid diameter as the denominator. Multiphase CT imaging of the brain was performed following IV bolus contrast injection. Subsequent parametric perfusion maps were calculated using RAPID software. CONTRAST:  140mL OMNIPAQUE IOHEXOL 350 MG/ML SOLN COMPARISON:  CT head 09/14/2019 FINDINGS: CTA NECK FINDINGS Aortic arch: Incomplete evaluation of the arch. Atherosclerotic disease in the aortic arch. Origins of the innominate and left carotid artery are not imaged. Left subclavian artery is patent proximally. Right carotid system: Right common carotid artery patent. Atherosclerotic disease in the proximal right internal carotid artery. Ulcerated plaque. Ulcer measures approximately 4 mm in diameter and projects posteriorly. No thrombus in the ulcer. Approximately 50% diameter stenosis proximal right internal carotid artery. Left carotid system: Left common carotid artery widely patent. Atherosclerotic disease left carotid bifurcation without significant stenosis. Vertebral arteries: Left vertebral artery dominant. Both vertebral arteries patent to the basilar. Skeleton: Cervical spondylosis.  No acute skeletal abnormality. Other neck: Negative for mass or adenopathy in the neck. Upper chest: Lung apices clear bilaterally. Review of the MIP images confirms the above findings CTA HEAD FINDINGS Anterior circulation: Atherosclerotic calcification in the cavernous carotid bilaterally without significant stenosis. Anterior and middle cerebral arteries patent bilaterally. Tortuosity of these vessels especially the anterior cerebral arteries. Posterior circulation: Both vertebral arteries patent to  the basilar. PICA patent bilaterally. Basilar widely patent. AICA, superior cerebellar, posterior  cerebral arteries patent bilaterally without stenosis or occlusion Venous sinuses: Normal venous enhancement Anatomic variants: None Review of the MIP images confirms the above findings CT Brain Perfusion Findings: ASPECTS: 10 CBF (<30%) Volume: 58mL Perfusion (Tmax>6.0s) volume: 15mL Mismatch Volume: 13mL Infarction Location:Delayed perfusion is noted in the left temporal lobe and right frontotemporal lobe. This could be due to ischemia however the pattern is somewhat atypical and this could be artifact. IMPRESSION: 1. 28 ml of delayed perfusion in the temporal lobes bilaterally. Favor artifact over ischemia. Recommend MRI to evaluate for infarct. 2. 50% diameter stenosis proximal right internal carotid artery with 4 mm plaque ulceration. No thrombus in the vessel. 3. Atherosclerotic disease left carotid bifurcation without significant stenosis 4. No significant vertebral artery stenosis 5. No significant intracranial stenosis or large vessel occlusion. Electronically Signed   By: Franchot Gallo M.D.   On: 09/14/2019 15:49   CT CEREBRAL PERFUSION W CONTRAST  Result Date: 09/14/2019 CLINICAL DATA:  Altered mental status.  Rule out stroke. EXAM: CT ANGIOGRAPHY HEAD AND NECK CT PERFUSION BRAIN TECHNIQUE: Multidetector CT imaging of the head and neck was performed using the standard protocol during bolus administration of intravenous contrast. Multiplanar CT image reconstructions and MIPs were obtained to evaluate the vascular anatomy. Carotid stenosis measurements (when applicable) are obtained utilizing NASCET criteria, using the distal internal carotid diameter as the denominator. Multiphase CT imaging of the brain was performed following IV bolus contrast injection. Subsequent parametric perfusion maps were calculated using RAPID software. CONTRAST:  181mL OMNIPAQUE IOHEXOL 350 MG/ML SOLN COMPARISON:  CT head 09/14/2019 FINDINGS: CTA NECK FINDINGS Aortic arch: Incomplete evaluation of the arch. Atherosclerotic  disease in the aortic arch. Origins of the innominate and left carotid artery are not imaged. Left subclavian artery is patent proximally. Right carotid system: Right common carotid artery patent. Atherosclerotic disease in the proximal right internal carotid artery. Ulcerated plaque. Ulcer measures approximately 4 mm in diameter and projects posteriorly. No thrombus in the ulcer. Approximately 50% diameter stenosis proximal right internal carotid artery. Left carotid system: Left common carotid artery widely patent. Atherosclerotic disease left carotid bifurcation without significant stenosis. Vertebral arteries: Left vertebral artery dominant. Both vertebral arteries patent to the basilar. Skeleton: Cervical spondylosis.  No acute skeletal abnormality. Other neck: Negative for mass or adenopathy in the neck. Upper chest: Lung apices clear bilaterally. Review of the MIP images confirms the above findings CTA HEAD FINDINGS Anterior circulation: Atherosclerotic calcification in the cavernous carotid bilaterally without significant stenosis. Anterior and middle cerebral arteries patent bilaterally. Tortuosity of these vessels especially the anterior cerebral arteries. Posterior circulation: Both vertebral arteries patent to the basilar. PICA patent bilaterally. Basilar widely patent. AICA, superior cerebellar, posterior cerebral arteries patent bilaterally without stenosis or occlusion Venous sinuses: Normal venous enhancement Anatomic variants: None Review of the MIP images confirms the above findings CT Brain Perfusion Findings: ASPECTS: 10 CBF (<30%) Volume: 57mL Perfusion (Tmax>6.0s) volume: 75mL Mismatch Volume: 63mL Infarction Location:Delayed perfusion is noted in the left temporal lobe and right frontotemporal lobe. This could be due to ischemia however the pattern is somewhat atypical and this could be artifact. IMPRESSION: 1. 28 ml of delayed perfusion in the temporal lobes bilaterally. Favor artifact over  ischemia. Recommend MRI to evaluate for infarct. 2. 50% diameter stenosis proximal right internal carotid artery with 4 mm plaque ulceration. No thrombus in the vessel. 3. Atherosclerotic disease left carotid bifurcation without significant stenosis 4. No  significant vertebral artery stenosis 5. No significant intracranial stenosis or large vessel occlusion. Electronically Signed   By: Franchot Gallo M.D.   On: 09/14/2019 15:49   ECHOCARDIOGRAM COMPLETE  Result Date: 09/15/2019   ECHOCARDIOGRAM REPORT   Patient Name:   Phillip Taylor Date of Exam: 09/15/2019 Medical Rec #:  [id number]    Height:       72.0 in Accession #:    [id number]   Weight:       134.7 lb Date of Birth:  [birth year]     BSA:          1.80 m Patient Age:    58 years     BP:           160/76 mmHg Patient Gender: M            HR:           85 bpm. Exam Location:  [facility] Procedure: 2D Echo Indications:    Stroke 434.91 / I163.9  History:        Patient has prior history of Echocardiogram examinations, most                 recent 05/05/2019. Arrythmias:Atrial Fibrillation; Risk                 Factors:Non-Smoker and Hypertension. Pulmonary embolism ,.  Sonographer:    Leavy Cella RDCS (AE) Referring Phys: [place]  1. Left ventricular ejection fraction, by visual estimation, is 60 to 65%. The left ventricle has normal function. Left ventricular septal wall thickness was severely increased. There is mildly increased left ventricular hypertrophy.  2. Left ventricular diastolic function could not be evaluated.  3. The left ventricle has no regional wall motion abnormalities.  4. Global right ventricle has normal systolic function.The right ventricular size is normal. No increase in right ventricular wall thickness.  5. Left atrial size was normal.  6. Right atrial size was normal.  7. The mitral valve is grossly normal. Trivial mitral valve regurgitation.  8. The tricuspid valve is not well visualized. Tricuspid valve  regurgitation is trivial.  9. The aortic valve is tricuspid. Aortic valve regurgitation is trivial. Mild aortic valve sclerosis without stenosis. 10. The pulmonic valve was grossly normal. Pulmonic valve regurgitation is not visualized. 11. The inferior vena cava is normal in size with greater than 50% respiratory variability, suggesting right atrial pressure of 3 mmHg. 12. The interatrial septum was not assessed. FINDINGS  Left Ventricle: Left ventricular ejection fraction, by visual estimation, is 60 to 65%. The left ventricle has normal function. The left ventricle has no regional wall motion abnormalities. There is mildly increased left ventricular hypertrophy. The left ventricular diastology could not be evaluated due to atrial fibrillation. Left ventricular diastolic function could not be evaluated. Right Ventricle: The right ventricular size is normal. No increase in right ventricular wall thickness. Global RV systolic function is has normal systolic function. Left Atrium: Left atrial size was normal in size. Right Atrium: Right atrial size was normal in size Pericardium: There is no evidence of pericardial effusion. Mitral Valve: The mitral valve is grossly normal. Trivial mitral valve regurgitation. Tricuspid Valve: The tricuspid valve is not well visualized. Tricuspid valve regurgitation is trivial. Aortic Valve: The aortic valve is tricuspid. Aortic valve regurgitation is trivial. Mild aortic valve sclerosis is present, with no evidence of aortic valve stenosis. Pulmonic Valve: The pulmonic valve was grossly normal. Pulmonic valve regurgitation is not visualized. Pulmonic  regurgitation is not visualized. Aorta: The aortic root and ascending aorta are structurally normal, with no evidence of dilitation. Venous: The inferior vena cava is normal in size with greater than 50% respiratory variability, suggesting right atrial pressure of 3 mmHg. IAS/Shunts: The interatrial septum was not assessed.  LEFT  VENTRICLE PLAX 2D LVIDd:         2.51 cm  Diastology LVIDs:         1.94 cm  LV e' lateral:   4.79 cm/s LV PW:         1.13 cm  LV E/e' lateral: 25.5 LV IVS:        1.37 cm  LV e' medial:    4.68 cm/s LVOT diam:     2.20 cm  LV E/e' medial:  26.1 LV SV:         11 ml LV SV Index:   6.16 LVOT Area:     3.80 cm  RIGHT VENTRICLE RV S prime:     17.10 cm/s TAPSE (M-mode): 2.3 cm LEFT ATRIUM             Index       RIGHT ATRIUM           Index LA diam:        3.00 cm 1.67 cm/m  RA Area:     11.30 cm LA Vol (A2C):   33.3 ml 18.49 ml/m RA Volume:   29.90 ml  16.60 ml/m LA Vol (A4C):   48.9 ml 27.15 ml/m LA Biplane Vol: 43.5 ml 24.15 ml/m   AORTA Ao Root diam: 2.90 cm MITRAL VALVE MV Area (PHT): 6.39 cm             SHUNTS MV PHT:        34.41 msec           Systemic Diam: 2.20 cm MV Decel Time: 119 msec MV E velocity: 122.00 cm/s 103 cm/s  Lyman Bishop MD Electronically signed by Lyman Bishop MD Signature Date/Time: 09/15/2019/11:33:28 AM    Final    CT HEAD CODE STROKE WO CONTRAST`  Result Date: 09/14/2019 CLINICAL DATA:  Code stroke.  Altered mental status EXAM: CT HEAD WITHOUT CONTRAST TECHNIQUE: Contiguous axial images were obtained from the base of the skull through the vertex without intravenous contrast. COMPARISON:  CT head 05/04/2019 FINDINGS: Brain: Moderate atrophy and moderate chronic microvascular ischemic changes in the white matter are stable. No acute infarct, hemorrhage, mass. No fluid collection or midline shift. Vascular: Negative for hyperdense vessel Skull: Negative Sinuses/Orbits: Mild mucosal edema in the frontal sinus. Remaining sinuses clear. Negative orbit. Other: None ASPECTS ([place] Stroke Program Early CT Score) - Ganglionic level infarction (caudate, lentiform nuclei, internal capsule, insula, M1-M3 cortex): 7 - Supraganglionic infarction (M4-M6 cortex): 3 Total score (0-10 with 10 being normal): 10 IMPRESSION: 1. No acute abnormality 2. ASPECTS is 10 3. Atrophy and chronic  microvascular ischemic changes stable from the prior study. 4. These results were called by telephone at the time of interpretation on 09/14/2019 at 3:01 pm to provider Rancour MD, who verbally acknowledged these results. Electronically Signed   By: Franchot Gallo M.D.   On: 09/14/2019 15:01    PHYSICAL EXAM  Temp:  [97.5 F (36.4 C)-99.6 F (37.6 C)] 98.4 F (36.9 C) (12/14 1202) Pulse Rate:  [76-89] 89 (12/14 1202) Resp:  [13-18] 14 (12/14 1202) BP: (131-172)/(70-96) 136/73 (12/14 1202) SpO2:  [97 %-99 %] 99 % (12/14 1202)  General - Well nourished,  well developed, in no apparent distress.  Ophthalmologic - fundi not visualized due to noncooperation.  Cardiovascular - Regular rhythm and rate, not in afib.  Neuro - awake alert, orientated to self and age, but not to time, place or people. Paucity of speech, able to speak in short sentences, no aphasia. Psychomotor slowing. Mild dysarthria, able to follow simple commands. But severe hard of hearing. Able to name 2/4 and repeat. Eyes tracking bilaterally, no gaze palsy, visual field full, blinking to visual threat bilaterally. Facial symmetric, tongue midline. BUE 4/5 and BLE 3-/5. Sensation symmetrical. Coordination not cooperative. DTR 1+ and no babinski. Gait not tested.     ASSESSMENT/PLAN Mr. Phillip Taylor is a 85 y.o. male with history of atrial fibrillation on Eliquis, PE, CKD, carotid stenosis, HTN, hypothyroidism, vocal cord cancer s/p resection and dementia who presented from [place] for evaluation of AMS and trouble walking with his walker.  Likely encephalopathy in the setting of dementia and dehydration  Code Stroke CT head No acute abnormality. Small vessel disease. Atrophy. ASPECTS 10.     CTA head & neck R ICA 50% stenosis w/ 40mm plaque ulceration.  CT perfusion delayed perfusion B temporal lobes.  Repeat CT pending   EEG pending  2D Echo EF 60-65%. No source of embolus   LDL 86  HgbA1c  5.6  Lovenox 40 mg sq daily for VTE prophylaxis Eliquis (apixaban) daily prior to admission, now on ASA 325mg . Will resume eliquis if CT repeat showed no large infarct.   Therapy recommendations:  pending   Disposition:  pending   PAF Hx PE  Home anticoagulation:  Eliquis (apixaban) daily   Last INR 1.2  Now on ASA 325  Will resume eliquis if CT repeat showed no large infarct.    Dehydration  BUN baseline 21  This admission BUN 34->25  Cre 1.15->0.95  On IVF  Hypertension  Stable . On coreg PTA, resume . Long-term BP goal normotensive  Hyperlipidemia  Home meds:  No statin  LDL 86, goal < 70  Put on pravastatin 20  Continue statin on discharge  Other Stroke Risk Factors  Advanced age  Hx carotid stenosis - this admission CTA right ICA 50% stenosis  Other Active Problems  Baseline dementia w/ behavioral disturbance   Fever 100.8 in ED. UA & CXR ok. Abx stopped  Hypothyroidism  BPH  Hx erosive gastropathy/duodenal ulcer  Hx vocal cord cancer s/p resection and [facility] day # 1   Rosalin Hawking, MD PhD Stroke Neurology 09/16/2019 1:44 PM    To contact Stroke Continuity provider, please refer to [email or web address] After hours, contact General Neurology

## 2019-09-16 NOTE — Evaluation (Signed)
Clinical/Bedside Swallow Evaluation Patient Details  Name: Phillip Taylor MRN: [id number] Date of Birth: 26-Nov-1924  Today's Date: 09/16/2019 Time: SLP Start Time (ACUTE ONLY): 0846 SLP Stop Time (ACUTE ONLY): 0856 SLP Time Calculation (min) (ACUTE ONLY): 10 min  Past Medical History:  Past Medical History:  Diagnosis Date  . BPH (benign prostatic hyperplasia)   . Carotid sinus hypersensitivity   . Carotid stenosis, asymptomatic, bilateral   . Dementia ([place])   . Femur fracture ([place])   . GERD (gastroesophageal reflux disease)   . Hypertension   . Hypothyroidism   . Osteoporosis   . PONV (postoperative nausea and vomiting)   . Vocal cord cancer ([place]) 1995   cancer of the larynx s/p resection and XRT   Past Surgical History:  Past Surgical History:  Procedure Laterality Date  . COLONOSCOPY  10/25/2012   Dr. Gala Romney: diverticulosis, multiple colon tubular adenomas removed. next TCS in 3 years.   . ESOPHAGOGASTRODUODENOSCOPY (EGD) WITH PROPOFOL N/A 04/11/2019   Procedure: ESOPHAGOGASTRODUODENOSCOPY (EGD) WITH PROPOFOL;  Surgeon: Daneil Dolin, MD;  [facility];  Service: Endoscopy;  Laterality: N/A;  2:00pm  . HERNIA REPAIR    . INTRAMEDULLARY (IM) NAIL INTERTROCHANTERIC Right 03/09/2019   Procedure: INTRAMEDULLARY (IM) NAIL INTERTROCHANTRIC;  Surgeon: Carole Civil, MD;  Location: [facility];  Service: Orthopedics;  Laterality: Right;  . MALONEY DILATION N/A 04/11/2019   Procedure: Venia Minks DILATION;  Surgeon: Daneil Dolin, MD;  [facility]: [facility];  Service: Endoscopy;  Laterality: N/A;  . NISSEN FUNDOPLICATION    . PACEMAKER INSERTION    . TRANSURETHRAL RESECTION OF PROSTATE    . vocal cord cancer removal  1995   HPI:  Pt is a 85 yo male admitted from SNF with AMS, not speaking. CT Head did not show acute changes; per MD note, exam is more consistent with cognitive impairment than stroke. CXR also without acute findings. Pt had a swallow evaluation in June 2020 with  hoarse vocal quality and coughing with dry solids and straw. Dys 3 diet and thin liquids recommended Pt had reported at that time that he got "strangled easily." PMH includes: laryngeal ca s/p resection and XRT, GERD, HTN, carotid stenosis, CKD, A fib, on Eliquis, dementia, HTN, anemia, PE   Assessment / Plan / Recommendation Clinical Impression  Pt presents with signs of dysphagia that appear to be consistent with BSE earlier this year. His oral cavity is dry with secretions on his velum and a coating on his teeth. RN made aware of discoloration of one tooth in particular on his left side that appears to be grayish in color. Xerostomia and hoarse vocal quality are noted, as they were in the past, and are likely attributable to his h/o XRT. Pt had coughing after dry cracker as well as straw sips of thin liquids only after swallowing the cracker. Possible esophageal symptoms include eructation and a "gurgling" sound that precipitated coughing. Given that his imaging thus far has not shown acute changes, recommend starting Dys 2 (chopped) diet and thin liquids via single cup sips. Would recommend full staff supervision as well as SLP f/u for tolerance.   SLP Visit Diagnosis: Dysphagia, oropharyngeal phase (R13.12)    Aspiration Risk  Mild aspiration risk;Moderate aspiration risk    Diet Recommendation Dysphagia 2 (Fine chop);Thin liquid   Liquid Administration via: Cup;No straw Medication Administration: Crushed with puree Supervision: Staff to assist with self feeding;Full supervision/cueing for compensatory strategies Compensations: Slow rate;Small sips/bites;Follow solids with liquid Postural  Changes: Seated upright at 90 degrees;Remain upright for at least 30 minutes after po intake    Other  Recommendations Oral Care Recommendations: Oral care QID   Follow up Recommendations Skilled Nursing facility      Frequency and Duration min 2x/week  1 week       Prognosis Prognosis for Safe  Diet Advancement: Fair Barriers to Reach Goals: Time post onset;Cognitive deficits      Swallow Study   General HPI: Pt is a 85 yo male admitted from SNF with AMS, not speaking. CT Head did not show acute changes; per MD note, exam is more consistent with cognitive impairment than stroke. CXR also without acute findings. Pt had a swallow evaluation in June 2020 with hoarse vocal quality and coughing with dry solids and straw. Dys 3 diet and thin liquids recommended Pt had reported at that time that he got "strangled easily." PMH includes: laryngeal ca s/p resection and XRT, GERD, HTN, carotid stenosis, CKD, A fib, on Eliquis, dementia, HTN, anemia, PE Type of Study: Bedside Swallow Evaluation Previous Swallow Assessment: see HPI Diet Prior to this Study: NPO Temperature Spikes Noted: No Respiratory Status: Room air History of Recent Intubation: No Behavior/Cognition: Alert;Cooperative;Confused;Requires cueing Oral Cavity Assessment: Dry;Dried secretions Oral Care Completed by SLP: Yes Oral Cavity - Dentition: Adequate natural dentition;Poor condition Vision: Functional for self-feeding Self-Feeding Abilities: Able to feed self;Needs assist Patient Positioning: Upright in bed Baseline Vocal Quality: Hoarse Volitional Swallow: Unable to elicit    Oral/Motor/Sensory Function Overall Oral Motor/Sensory Function: (not following all commands but appears to be nonfocal)   Ice Chips Ice chips: Within functional limits Presentation: Spoon   Thin Liquid Thin Liquid: Impaired Presentation: Cup;Self Fed;Straw Pharyngeal  Phase Impairments: Cough - Immediate;Cough - Delayed;Multiple swallows    Nectar Thick Nectar Thick Liquid: Not tested   Honey Thick Honey Thick Liquid: Not tested   Puree Puree: Within functional limits Presentation: Spoon   Solid     Solid: Impaired Presentation: Self Fed Oral Phase Impairments: Impaired mastication Pharyngeal Phase Impairments: Cough - Immediate       Phillip Taylor 09/16/2019,9:24 AM  Phillip Taylor, M.A. [place] Acute Environmental education officer [phone] Office [phone]

## 2019-09-16 NOTE — Evaluation (Signed)
Speech Language Pathology Evaluation Patient Details Name: Phillip Taylor MRN: [id number] DOB: 07/16/1925 Today's Date: 09/16/2019 Time: [id number] SLP Time Calculation (min) (ACUTE ONLY): 9 min  Problem List:  Patient Active Problem List   Diagnosis Date Noted  . Stroke-like symptom 09/15/2019  . Speech abnormality 09/14/2019  . Acute parotitis 05/06/2019  . CKD (chronic kidney disease) stage 3, GFR 30-59 ml/min 05/05/2019  . Pulmonary embolism ([place]) 05/04/2019  . Atrial fibrillation ([place]) 05/04/2019  . GERD without esophagitis 04/17/2019  . Poor appetite 04/09/2019  . Dysphagia 04/09/2019  . Weight loss 04/07/2019  . Protein-calorie malnutrition, severe ([place]) 03/30/2019  . Elevated liver enzymes 03/29/2019  . Vaso vagal episode 03/29/2019  . Hypokalemia 03/20/2019  . Chronic constipation 03/12/2019  . Carotid artery stenosis 03/12/2019  . Carotid sinus hypersensitivity 03/12/2019  . Hypocalcemia 03/12/2019  . Acute blood loss anemia 03/12/2019  . Dementia without behavioral disturbance ([place]) 03/12/2019  . Intertrochanteric fracture of right femur, closed, initial encounter ([place]) 03/09/2019  . Acute dehydration 03/09/2019  . Osteoporosis 03/09/2019  . Essential hypertension 03/09/2019  . Hypothyroidism 03/09/2019  . Intertrochanteric fracture ([place]) 03/09/2019  . Heme positive stool 10/17/2012   Past Medical History:  Past Medical History:  Diagnosis Date  . BPH (benign prostatic hyperplasia)   . Carotid sinus hypersensitivity   . Carotid stenosis, asymptomatic, bilateral   . Dementia ([place])   . Femur fracture ([place])   . GERD (gastroesophageal reflux disease)   . Hypertension   . Hypothyroidism   . Osteoporosis   . PONV (postoperative nausea and vomiting)   . Vocal cord cancer ([place]) 1995   cancer of the larynx s/p resection and XRT   Past Surgical History:  Past Surgical History:  Procedure Laterality Date  . COLONOSCOPY  10/25/2012   Dr. Gala Romney: diverticulosis,  multiple colon tubular adenomas removed. next TCS in 3 years.   . ESOPHAGOGASTRODUODENOSCOPY (EGD) WITH PROPOFOL N/A 04/11/2019   Procedure: ESOPHAGOGASTRODUODENOSCOPY (EGD) WITH PROPOFOL;  Surgeon: Daneil Dolin, MD;  [facility];  Service: Endoscopy;  Laterality: N/A;  2:00pm  . HERNIA REPAIR    . INTRAMEDULLARY (IM) NAIL INTERTROCHANTERIC Right 03/09/2019   Procedure: INTRAMEDULLARY (IM) NAIL INTERTROCHANTRIC;  Surgeon: Carole Civil, MD;  Location: [facility];  Service: Orthopedics;  Laterality: Right;  . MALONEY DILATION N/A 04/11/2019   Procedure: Venia Minks DILATION;  Surgeon: Daneil Dolin, MD;  [facility];  Service: Endoscopy;  Laterality: N/A;  . NISSEN FUNDOPLICATION    . PACEMAKER INSERTION    . TRANSURETHRAL RESECTION OF PROSTATE    . vocal cord cancer removal  1995   HPI:  Pt is a 85 yo male admitted from SNF with AMS, not speaking. CT Head did not show acute changes; per MD note, exam is more consistent with cognitive impairment than stroke. CXR also without acute findings. Pt had a swallow evaluation in June 2020 with hoarse vocal quality and coughing with dry solids and straw. Dys 3 diet and thin liquids recommended Pt had reported at that time that he got "strangled easily." PMH includes: laryngeal ca s/p resection and XRT, GERD, HTN, carotid stenosis, CKD, A fib, on Eliquis, dementia, HTN, anemia, PE   Assessment / Plan / Recommendation Clinical Impression   Pt is oriented to self only and follows commands intermittently (more so within a functional context). He has a hoarse vocal quality at baseline and his speech is otherwise clear. Pt was reportedly not speaking at his facility but is responsive  to most questions at this time (although unclear with what accuracy). Given that he is communicating more and CT Head did not suggest acute changes, SLP will defer any cognitive-linguistic f/u acutely. Will defer any possible cognitive needs to SNF.     SLP  Assessment  SLP Recommendation/Assessment: Patient does not need any further Speech Lanaguage Pathology Services SLP Visit Diagnosis: Cognitive communication deficit (R41.841)    Follow Up Recommendations  Skilled Nursing facility    Frequency and Duration min 2x/week         SLP Evaluation Cognition  Overall Cognitive Status: History of cognitive impairments - at baseline       Comprehension  Auditory Comprehension Overall Auditory Comprehension: Impaired at baseline    Expression Expression Primary Mode of Expression: Verbal Verbal Expression Overall Verbal Expression: Impaired at baseline   Oral / Motor  Oral Motor/Sensory Function Overall Oral Motor/Sensory Function: (not following all commands but appears to be nonfocal) Motor Speech Overall Motor Speech: Impaired at baseline   GO                    Venita Sheffield Allegra Cerniglia 09/16/2019, 9:32 AM  Pollyann Glen, M.A. [place] Acute Environmental education officer [phone] Office [phone]

## 2019-09-16 NOTE — Evaluation (Signed)
Physical Therapy Evaluation Patient Details Name: Phillip Taylor MRN: [id number] DOB: [birth year] Today's Date: 09/16/2019   History of Present Illness  Phillip Taylor is an 85 y.o. male with history of atrial fibrillation on Eliquis, PE, CKD, carotid stenosis, HTN, hypothyroidism, vocal cord cancer s/p resection and dementia who presented from [place] for evaluation of AMS. Pt was having difficulty eating and speaking. Head CT was neg for acute abnormalities  Clinical Impression  Pt admitted with above diagnosis. Pt with significantly delayed processing exacerbated by hearing loss. Required mod A for bed mobility and mod A +2 for transfers and short distance ambulation.  Pt currently with functional limitations due to the deficits listed below (see PT Problem List). Pt will benefit from skilled PT to increase their independence and safety with mobility to allow discharge to the venue listed below.       Follow Up Recommendations SNF;Supervision/Assistance - 24 hour    Equipment Recommendations  None recommended by PT    Recommendations for Other Services       Precautions / Restrictions Precautions Precautions: Fall Restrictions Weight Bearing Restrictions: No      Mobility  Bed Mobility Overal bed mobility: Needs Assistance Bed Mobility: Supine to Sit     Supine to sit: Mod assist     General bed mobility comments: pt verbalized that he would come to EOB when instructed but made no move to actually mobilize. Once given tactile cues, was able to continue motion. Mod A for hips to EOB  Transfers Overall transfer level: Needs assistance Equipment used: Rolling walker (2 wheeled) Transfers: Sit to/from Stand Sit to Stand: Mod assist;+2 physical assistance         General transfer comment: mod A +2 for power up and to block feet, pt maintained flexed trunk and posterior bias, c/o mild dizziness  Ambulation/Gait Ambulation/Gait assistance: Mod assist;+2  physical assistance Gait Distance (Feet): 4 Feet Assistive device: Rolling walker (2 wheeled) Gait Pattern/deviations: Step-to pattern;Decreased step length - right;Decreased step length - left;Trunk flexed Gait velocity: decreased Gait velocity interpretation: <1.31 ft/sec, indicative of household ambulator General Gait Details: pt needed vc's for stepping each foot as well as assist moving RW. Mod support given bilaterally.   Stairs            Wheelchair Mobility    Modified Rankin (Stroke Patients Only)       Balance Overall balance assessment: Needs assistance Sitting-balance support: Single extremity supported;Feet supported Sitting balance-Leahy Scale: Fair     Standing balance support: Bilateral upper extremity supported Standing balance-Leahy Scale: Poor Standing balance comment: heavily reliant on UE support                             Pertinent Vitals/Pain Pain Assessment: Faces Faces Pain Scale: No hurt    Home Living Family/patient expects to be discharged to:: Skilled nursing facility   Available Help at Discharge: [place] Type of Home: [place]                Prior Function Level of Independence: Needs assistance   Gait / Transfers Assistance Needed: pt unable to give any history but per chart, ambulated with a RW  ADL's / Homemaking Assistance Needed: assist needed by staff        Hand Dominance   Dominant Hand: Right    Extremity/Trunk Assessment   Upper Extremity Assessment Upper Extremity Assessment: Defer to OT  evaluation    Lower Extremity Assessment Lower Extremity Assessment: Generalized weakness    Cervical / Trunk Assessment Cervical / Trunk Assessment: Kyphotic  Communication   Communication: HOH  Cognition Arousal/Alertness: Awake/alert Behavior During Therapy: Flat affect Overall Cognitive Status: Impaired/Different from baseline Area of Impairment:  Orientation;Attention;Memory;Following commands;Safety/judgement;Awareness;Problem solving                 Orientation Level: Disoriented to;Situation;Time;Place Current Attention Level: Sustained Memory: Decreased recall of precautions Following Commands: Follows one step commands with increased time Safety/Judgement: Decreased awareness of deficits;Decreased awareness of safety Awareness: Intellectual Problem Solving: Slow processing;Decreased initiation;Requires verbal cues;Requires tactile cues;Difficulty sequencing General Comments: pt very slow to process, hearing likely also contributing to this      General Comments General comments (skin integrity, edema, etc.): HR in 90's with mobility    Exercises     Assessment/Plan    PT Assessment Patient needs continued PT services  PT Problem List Decreased cognition;Decreased coordination;Decreased mobility;Decreased balance;Decreased activity tolerance;Decreased strength;Decreased knowledge of use of DME;Decreased safety awareness;Decreased knowledge of precautions       PT Treatment Interventions DME instruction;Gait training;Functional mobility training;Therapeutic activities;Therapeutic exercise;Balance training;Neuromuscular re-education;Cognitive remediation;Patient/family education    PT Goals (Current goals can be found in the Care Plan section)  Acute Rehab PT Goals Patient Stated Goal: none stated PT Goal Formulation: Patient unable to participate in goal setting Time For Goal Achievement: 09/30/19 Potential to Achieve Goals: Fair    Frequency Min 2X/week   Barriers to discharge        Co-evaluation PT/OT/SLP Co-Evaluation/Treatment: Yes Reason for Co-Treatment: Complexity of the patient's impairments (multi-system involvement);Necessary to address cognition/behavior during functional activity;For patient/therapist safety PT goals addressed during session: Mobility/safety with mobility;Balance;Proper use of  DME         AM-PAC PT "6 Clicks" Mobility  Outcome Measure Help needed turning from your back to your side while in a flat bed without using bedrails?: A Lot Help needed moving from lying on your back to sitting on the side of a flat bed without using bedrails?: A Lot Help needed moving to and from a bed to a chair (including a wheelchair)?: A Lot Help needed standing up from a chair using your arms (e.g., wheelchair or bedside chair)?: A Lot Help needed to walk in hospital room?: A Lot Help needed climbing 3-5 steps with a railing? : Total 6 Click Score: 11    End of Session Equipment Utilized During Treatment: Gait belt Activity Tolerance: Patient tolerated treatment well Patient left: in chair;with chair alarm set;with call bell/phone within reach Nurse Communication: Mobility status PT Visit Diagnosis: Unsteadiness on feet (R26.81);Muscle weakness (generalized) (M62.81);Other abnormalities of gait and mobility (R26.89)    Time: [id number] PT Time Calculation (min) (ACUTE ONLY): 24 min   Charges:   PT Evaluation $PT Eval Moderate Complexity: [place]  Pager [phone] Office [place] 09/16/2019, 1:25 PM

## 2019-09-16 NOTE — Progress Notes (Signed)
EEG complete - results pending 

## 2019-09-16 NOTE — Progress Notes (Addendum)
Phillip Taylor Phillip Taylor  PROGRESS NOTE    Phillip Taylor  [id number] DOB: June 09, 1925 DOA: 09/14/2019 PCP: Phillip Blitz, MD   Brief Narrative:   Phillip Taylor a 85 y.o.Caucasian malewith medical history significant fordementia, pulmonary embolism,carotid artery stenosis, atrial fibrillation, CKD 3, BPH, hypertension, vocal cord cancer. Patient was brought to the ED from [facility] with reports of altered mental status, patient not communicating and having trouble ambulating.  09/16/19: Denies complaints this AM. Working with PT/OT/SLP. Home soon   Assessment & Plan:   Active Problems:   Speech abnormality   Stroke-like symptom  Speech abnormality/difficulty walking Baseline dementia     - concern for stroke     - CT negative for acute abnormality.      - CTperfusion/CTA H&N: 28 ml of delayed perfusion in the temporal lobes bilaterally. Favor artifact over ischemia. Recommend MRI to evaluate for infarct. 50% diameter stenosis proximal right internal carotid artery with 4 mm plaque ulceration. No thrombus in the vessel. Atherosclerotic disease left carotid bifurcation without significant stenosis     - No significant intracranial stenosis or large vessel occlusion.     - SLP: Dys 2 diet     - PT/OT rec SNF     - Hgb A1c 5.6     - TSH 3.385     - Tchol 135, HDL 25, LDL 86     - resume eliquis, d/c lovenox ppx     - ASA, statin  Fever     - 100.8in ED.      - Nursing home resident.      - UA unremarkable.      - Portable chest x-ray chronic findings.     - COVID neg     - WBC, vitals normal     - Bld Cx NTD, UCx dirty catch; no fevers ON, white count stable, monitor  History of pulmonary embolism Hx of atrial fibrillation-rate controlled      - continue coreg, eliquis  Hypothyroidism      - continue synthroid  History of dementia with behavioral disturbance     - nursing home resident, at baseline he communicates, ambulates with a wheelchair.  BPH     -  stable.      - Not on medication.  History of erosive gastropathy/duodenal ulcer     - protonix  DVT prophylaxis: eliquis Code Status: DNR   Disposition Plan: TBD  Consultants:   Neurology  Subjective: "Phillip Taylor. Ok."  Objective: Vitals:   09/16/19 0025 09/16/19 0414 09/16/19 0814 09/16/19 1202  BP: (!) 153/89 (!) 152/70 133/78 136/73  Pulse: 76 83 83 89  Resp: 18 18 16 14   Temp: 98.2 F (36.8 C) 98.5 F (36.9 C) 99.6 F (37.6 C) 98.4 F (36.9 C)  TempSrc: Oral Oral Oral Oral  SpO2: 99% 99% 98% 99%  Weight:      Height:        Intake/Output Summary (Last 24 hours) at 09/16/2019 1442 Last data filed at 09/16/2019 0600 Gross per 24 hour  Intake 1257.44 ml  Output 800 ml  Net 457.44 ml   Filed Weights   09/14/19 1319  Weight: 61.1 kg    Examination:  General: 85 y.o. male resting in bed in NAD Cardiovascular: RRR, +S1, S2,1/6 SEM Respiratory: CTABL, no w/r/r, normal WOB GI: BS+, NDNT, soft MSK: No e/c/c Neuro: alert to name only, follows commands Psyc:  calm/cooperative   Data Reviewed: I have personally reviewed following labs and imaging studies.  CBC: Recent Labs  Lab 09/14/19 1415 09/15/19 0439  WBC 5.0 4.5  HGB 15.7 13.4  HCT 47.5 40.8  MCV 98.1 98.3  PLT 148* [id number]*   Basic Metabolic Panel: Recent Labs  Lab 09/14/19 1415 09/15/19 0439  NA 138 138  K 4.3 3.5  CL 103 105  CO2 23 23  GLUCOSE 131* 130*  BUN 34* 25*  CREATININE 1.15 0.95  CALCIUM 8.7* 8.0*   GFR: Estimated Creatinine Clearance: 42.9 mL/min (by C-G formula based on SCr of 0.95 mg/dL). Liver Function Tests: Recent Labs  Lab 09/14/19 1415  AST 71*  ALT 41  ALKPHOS 162*  BILITOT 1.3*  PROT 7.8  ALBUMIN 3.9   No results for input(s): LIPASE, AMYLASE in the last 168 hours. No results for input(s): AMMONIA in the last 168 hours. Coagulation Profile: Recent Labs  Lab 09/14/19 1421  INR 1.2   Cardiac Enzymes: No results for input(s): CKTOTAL, CKMB, CKMBINDEX,  TROPONINI in the last 168 hours. BNP (last 3 results) No results for input(s): PROBNP in the last 8760 hours. HbA1C: Recent Labs    09/15/19 0439  HGBA1C 5.6   CBG: Recent Labs  Lab 09/14/19 1358 09/16/19 0024  GLUCAP 129* 112*   Lipid Profile: Recent Labs    09/15/19 0439  CHOL 135  HDL 25*  LDLCALC 86  TRIG 121  CHOLHDL 5.4   Thyroid Function Tests: Recent Labs    09/14/19 1415  TSH 3.385   Anemia Panel: No results for input(s): VITAMINB12, FOLATE, FERRITIN, TIBC, IRON, RETICCTPCT in the last 72 hours. Sepsis Labs: Recent Labs  Lab 09/14/19 1431 09/14/19 1636  LATICACIDVEN 2.2* 1.6    Recent Results (from the past 240 hour(s))  Urine culture     Status: Abnormal   Collection Time: 09/14/19  1:37 PM   Specimen: Urine, Clean Catch  Result Value Ref Range Status   Specimen Description   Final    URINE, CLEAN CATCH Performed at [facility], [address], [place], [place] [address]    Special Requests   Final    NONE Performed at [facility], [address], [place], [place] [address]    Culture MULTIPLE SPECIES PRESENT, SUGGEST RECOLLECTION (A)  Final   Report Status 09/16/2019 FINAL  Final  Blood culture (routine x 2)     Status: None (Preliminary result)   Collection Time: 09/14/19  1:48 PM   Specimen: Right Antecubital; Blood  Result Value Ref Range Status   Specimen Description   Final    RIGHT ANTECUBITAL BOTTLES DRAWN AEROBIC AND ANAEROBIC   Special Requests Blood Culture adequate volume  Final   Culture   Final    NO GROWTH 2 DAYS Performed at [facility], [address], [place], [place] [address]    Report Status PENDING  Incomplete  Blood culture (routine x 2)     Status: None (Preliminary result)   Collection Time: 09/14/19  2:15 PM   Specimen: Right Antecubital; Blood  Result Value Ref Range Status   Specimen Description RIGHT ANTECUBITAL BOTTLES DRAWN AEROBIC ONLY  Final   Special Requests Blood Culture adequate volume  Final    Culture   Final    NO GROWTH 2 DAYS Performed at [facility], [address] [place], [place] [address]    Report Status PENDING  Incomplete  SARS CORONAVIRUS 2 (TAT 6-24 HRS) Nasopharyngeal Nasopharyngeal Swab     Status: None   Collection Time: 09/14/19  4:06 PM   Specimen: Nasopharyngeal  Swab  Result Value Ref Range Status   SARS Coronavirus 2 NEGATIVE NEGATIVE Final    Comment: (NOTE) SARS-CoV-2 target nucleic acids are NOT DETECTED. The SARS-CoV-2 RNA is generally detectable in upper and lower respiratory specimens during the acute phase of infection. Negative results do not preclude SARS-CoV-2 infection, do not rule out co-infections with other pathogens, and should not be used as the sole basis for treatment or other patient management decisions. Negative results must be combined with clinical observations, patient history, and epidemiological information. The expected result is Negative. Fact Sheet for Patients: SugarRoll.be Fact Sheet for Healthcare Providers: https://www.woods-mathews.com/ This test is not yet approved or cleared by the [place] FDA and  has been authorized for detection and/or diagnosis of SARS-CoV-2 by FDA under an Emergency Use Authorization (EUA). This EUA will remain  in effect (meaning this test can be used) for the duration of the COVID-19 declaration under Section 56 4(b)(1) of the Act, 21 U.S.C. section 360bbb-3(b)(1), unless the authorization is terminated or revoked sooner. Performed at [facility], [place] [address], [place], [place] [address]   MRSA PCR Screening     Status: None   Collection Time: 09/15/19  4:13 PM   Specimen: Nasopharyngeal  Result Value Ref Range Status   MRSA by PCR NEGATIVE NEGATIVE Final    Comment:        The GeneXpert MRSA Assay (FDA approved for NASAL specimens only), is one component of a comprehensive MRSA colonization surveillance program. It is  not intended to diagnose MRSA infection nor to guide or monitor treatment for MRSA infections. Performed at [facility], [place] [address] [place], [place] [address]       Radiology Studies: CT Angio Head W or Wo Contrast  Result Date: 09/14/2019 CLINICAL DATA:  Altered mental status.  Rule out stroke. EXAM: CT ANGIOGRAPHY HEAD AND NECK CT PERFUSION BRAIN TECHNIQUE: Multidetector CT imaging of the head and neck was performed using the standard protocol during bolus administration of intravenous contrast. Multiplanar CT image reconstructions and MIPs were obtained to evaluate the vascular anatomy. Carotid stenosis measurements (when applicable) are obtained utilizing NASCET criteria, using the distal internal carotid diameter as the denominator. Multiphase CT imaging of the brain was performed following IV bolus contrast injection. Subsequent parametric perfusion maps were calculated using RAPID software. CONTRAST:  168mL OMNIPAQUE IOHEXOL 350 MG/ML SOLN COMPARISON:  CT head 09/14/2019 FINDINGS: CTA NECK FINDINGS Aortic arch: Incomplete evaluation of the arch. Atherosclerotic disease in the aortic arch. Origins of the innominate and left carotid artery are not imaged. Left subclavian artery is patent proximally. Right carotid system: Right common carotid artery patent. Atherosclerotic disease in the proximal right internal carotid artery. Ulcerated plaque. Ulcer measures approximately 4 mm in diameter and projects posteriorly. No thrombus in the ulcer. Approximately 50% diameter stenosis proximal right internal carotid artery. Left carotid system: Left common carotid artery widely patent. Atherosclerotic disease left carotid bifurcation without significant stenosis. Vertebral arteries: Left vertebral artery dominant. Both vertebral arteries patent to the basilar. Skeleton: Cervical spondylosis.  No acute skeletal abnormality. Other neck: Negative for mass or adenopathy in the neck. Upper chest:  Lung apices clear bilaterally. Review of the MIP images confirms the above findings CTA HEAD FINDINGS Anterior circulation: Atherosclerotic calcification in the cavernous carotid bilaterally without significant stenosis. Anterior and middle cerebral arteries patent bilaterally. Tortuosity of these vessels especially the anterior cerebral arteries. Posterior circulation: Both vertebral arteries patent to the basilar. PICA patent bilaterally. Basilar widely  patent. AICA, superior cerebellar, posterior cerebral arteries patent bilaterally without stenosis or occlusion Venous sinuses: Normal venous enhancement Anatomic variants: None Review of the MIP images confirms the above findings CT Brain Perfusion Findings: ASPECTS: 10 CBF (<30%) Volume: 21mL Perfusion (Tmax>6.0s) volume: 5mL Mismatch Volume: 19mL Infarction Location:Delayed perfusion is noted in the left temporal lobe and right frontotemporal lobe. This could be due to ischemia however the pattern is somewhat atypical and this could be artifact. IMPRESSION: 1. 28 ml of delayed perfusion in the temporal lobes bilaterally. Favor artifact over ischemia. Recommend MRI to evaluate for infarct. 2. 50% diameter stenosis proximal right internal carotid artery with 4 mm plaque ulceration. No thrombus in the vessel. 3. Atherosclerotic disease left carotid bifurcation without significant stenosis 4. No significant vertebral artery stenosis 5. No significant intracranial stenosis or large vessel occlusion. Electronically Signed   By: Franchot Gallo M.D.   On: 09/14/2019 15:49   DG Chest 1 View  Result Date: 09/14/2019 CLINICAL DATA:  Aphasia EXAM: CHEST  1 VIEW COMPARISON:  Chest radiograph 05/04/2019 FINDINGS: Stable cardiomediastinal contours. Left chest ICD remains in place. There is mild coarsening of the interstitium bilaterally likely representing chronic changes. No focal infiltrate. No pneumothorax or large pleural effusion. No acute finding in the visualized  skeleton. IMPRESSION: Mild coarsening of the interstitium bilaterally favored represent chronic changes. No evidence of active disease. Electronically Signed   By: Audie Pinto M.D.   On: 09/14/2019 15:50   CT Angio Neck W and/or Wo Contrast  Result Date: 09/14/2019 CLINICAL DATA:  Altered mental status.  Rule out stroke. EXAM: CT ANGIOGRAPHY HEAD AND NECK CT PERFUSION BRAIN TECHNIQUE: Multidetector CT imaging of the head and neck was performed using the standard protocol during bolus administration of intravenous contrast. Multiplanar CT image reconstructions and MIPs were obtained to evaluate the vascular anatomy. Carotid stenosis measurements (when applicable) are obtained utilizing NASCET criteria, using the distal internal carotid diameter as the denominator. Multiphase CT imaging of the brain was performed following IV bolus contrast injection. Subsequent parametric perfusion maps were calculated using RAPID software. CONTRAST:  155mL OMNIPAQUE IOHEXOL 350 MG/ML SOLN COMPARISON:  CT head 09/14/2019 FINDINGS: CTA NECK FINDINGS Aortic arch: Incomplete evaluation of the arch. Atherosclerotic disease in the aortic arch. Origins of the innominate and left carotid artery are not imaged. Left subclavian artery is patent proximally. Right carotid system: Right common carotid artery patent. Atherosclerotic disease in the proximal right internal carotid artery. Ulcerated plaque. Ulcer measures approximately 4 mm in diameter and projects posteriorly. No thrombus in the ulcer. Approximately 50% diameter stenosis proximal right internal carotid artery. Left carotid system: Left common carotid artery widely patent. Atherosclerotic disease left carotid bifurcation without significant stenosis. Vertebral arteries: Left vertebral artery dominant. Both vertebral arteries patent to the basilar. Skeleton: Cervical spondylosis.  No acute skeletal abnormality. Other neck: Negative for mass or adenopathy in the neck. Upper  chest: Lung apices clear bilaterally. Review of the MIP images confirms the above findings CTA HEAD FINDINGS Anterior circulation: Atherosclerotic calcification in the cavernous carotid bilaterally without significant stenosis. Anterior and middle cerebral arteries patent bilaterally. Tortuosity of these vessels especially the anterior cerebral arteries. Posterior circulation: Both vertebral arteries patent to the basilar. PICA patent bilaterally. Basilar widely patent. AICA, superior cerebellar, posterior cerebral arteries patent bilaterally without stenosis or occlusion Venous sinuses: Normal venous enhancement Anatomic variants: None Review of the MIP images confirms the above findings CT Brain Perfusion Findings: ASPECTS: 10 CBF (<30%) Volume: 20mL Perfusion (Tmax>6.0s) volume: 87mL  Mismatch Volume: 20mL Infarction Location:Delayed perfusion is noted in the left temporal lobe and right frontotemporal lobe. This could be due to ischemia however the pattern is somewhat atypical and this could be artifact. IMPRESSION: 1. 28 ml of delayed perfusion in the temporal lobes bilaterally. Favor artifact over ischemia. Recommend MRI to evaluate for infarct. 2. 50% diameter stenosis proximal right internal carotid artery with 4 mm plaque ulceration. No thrombus in the vessel. 3. Atherosclerotic disease left carotid bifurcation without significant stenosis 4. No significant vertebral artery stenosis 5. No significant intracranial stenosis or large vessel occlusion. Electronically Signed   By: Franchot Gallo M.D.   On: 09/14/2019 15:49   CT CEREBRAL PERFUSION W CONTRAST  Result Date: 09/14/2019 CLINICAL DATA:  Altered mental status.  Rule out stroke. EXAM: CT ANGIOGRAPHY HEAD AND NECK CT PERFUSION BRAIN TECHNIQUE: Multidetector CT imaging of the head and neck was performed using the standard protocol during bolus administration of intravenous contrast. Multiplanar CT image reconstructions and MIPs were obtained to  evaluate the vascular anatomy. Carotid stenosis measurements (when applicable) are obtained utilizing NASCET criteria, using the distal internal carotid diameter as the denominator. Multiphase CT imaging of the brain was performed following IV bolus contrast injection. Subsequent parametric perfusion maps were calculated using RAPID software. CONTRAST:  133mL OMNIPAQUE IOHEXOL 350 MG/ML SOLN COMPARISON:  CT head 09/14/2019 FINDINGS: CTA NECK FINDINGS Aortic arch: Incomplete evaluation of the arch. Atherosclerotic disease in the aortic arch. Origins of the innominate and left carotid artery are not imaged. Left subclavian artery is patent proximally. Right carotid system: Right common carotid artery patent. Atherosclerotic disease in the proximal right internal carotid artery. Ulcerated plaque. Ulcer measures approximately 4 mm in diameter and projects posteriorly. No thrombus in the ulcer. Approximately 50% diameter stenosis proximal right internal carotid artery. Left carotid system: Left common carotid artery widely patent. Atherosclerotic disease left carotid bifurcation without significant stenosis. Vertebral arteries: Left vertebral artery dominant. Both vertebral arteries patent to the basilar. Skeleton: Cervical spondylosis.  No acute skeletal abnormality. Other neck: Negative for mass or adenopathy in the neck. Upper chest: Lung apices clear bilaterally. Review of the MIP images confirms the above findings CTA HEAD FINDINGS Anterior circulation: Atherosclerotic calcification in the cavernous carotid bilaterally without significant stenosis. Anterior and middle cerebral arteries patent bilaterally. Tortuosity of these vessels especially the anterior cerebral arteries. Posterior circulation: Both vertebral arteries patent to the basilar. PICA patent bilaterally. Basilar widely patent. AICA, superior cerebellar, posterior cerebral arteries patent bilaterally without stenosis or occlusion Venous sinuses: Normal  venous enhancement Anatomic variants: None Review of the MIP images confirms the above findings CT Brain Perfusion Findings: ASPECTS: 10 CBF (<30%) Volume: 81mL Perfusion (Tmax>6.0s) volume: 19mL Mismatch Volume: 72mL Infarction Location:Delayed perfusion is noted in the left temporal lobe and right frontotemporal lobe. This could be due to ischemia however the pattern is somewhat atypical and this could be artifact. IMPRESSION: 1. 28 ml of delayed perfusion in the temporal lobes bilaterally. Favor artifact over ischemia. Recommend MRI to evaluate for infarct. 2. 50% diameter stenosis proximal right internal carotid artery with 4 mm plaque ulceration. No thrombus in the vessel. 3. Atherosclerotic disease left carotid bifurcation without significant stenosis 4. No significant vertebral artery stenosis 5. No significant intracranial stenosis or large vessel occlusion. Electronically Signed   By: Franchot Gallo M.D.   On: 09/14/2019 15:49   ECHOCARDIOGRAM COMPLETE  Result Date: 09/15/2019   ECHOCARDIOGRAM REPORT   Patient Name:   Sujay AKIEL MARES Date of Exam: 09/15/2019  Medical Rec #:  [id number]    Height:       72.0 in Accession #:    [id number]   Weight:       134.7 lb Date of Birth:  12/18/1924     BSA:          1.80 m Patient Age:    71 years     BP:           160/76 mmHg Patient Gender: M            HR:           85 bpm. Exam Location:  [facility] Procedure: 2D Echo Indications:    Stroke 434.91 / I163.9  History:        Patient has prior history of Echocardiogram examinations, most                 recent 05/05/2019. Arrythmias:Atrial Fibrillation; Risk                 Factors:Non-Smoker and Hypertension. Pulmonary embolism ,.  Sonographer:    Leavy Cella RDCS (AE) Referring Phys: [place]  1. Left ventricular ejection fraction, by visual estimation, is 60 to 65%. The left ventricle has normal function. Left ventricular septal wall thickness was severely increased. There is mildly  increased left ventricular hypertrophy.  2. Left ventricular diastolic function could not be evaluated.  3. The left ventricle has no regional wall motion abnormalities.  4. Global right ventricle has normal systolic function.The right ventricular size is normal. No increase in right ventricular wall thickness.  5. Left atrial size was normal.  6. Right atrial size was normal.  7. The mitral valve is grossly normal. Trivial mitral valve regurgitation.  8. The tricuspid valve is not well visualized. Tricuspid valve regurgitation is trivial.  9. The aortic valve is tricuspid. Aortic valve regurgitation is trivial. Mild aortic valve sclerosis without stenosis. 10. The pulmonic valve was grossly normal. Pulmonic valve regurgitation is not visualized. 11. The inferior vena cava is normal in size with greater than 50% respiratory variability, suggesting right atrial pressure of 3 mmHg. 12. The interatrial septum was not assessed. FINDINGS  Left Ventricle: Left ventricular ejection fraction, by visual estimation, is 60 to 65%. The left ventricle has normal function. The left ventricle has no regional wall motion abnormalities. There is mildly increased left ventricular hypertrophy. The left ventricular diastology could not be evaluated due to atrial fibrillation. Left ventricular diastolic function could not be evaluated. Right Ventricle: The right ventricular size is normal. No increase in right ventricular wall thickness. Global RV systolic function is has normal systolic function. Left Atrium: Left atrial size was normal in size. Right Atrium: Right atrial size was normal in size Pericardium: There is no evidence of pericardial effusion. Mitral Valve: The mitral valve is grossly normal. Trivial mitral valve regurgitation. Tricuspid Valve: The tricuspid valve is not well visualized. Tricuspid valve regurgitation is trivial. Aortic Valve: The aortic valve is tricuspid. Aortic valve regurgitation is trivial. Mild aortic  valve sclerosis is present, with no evidence of aortic valve stenosis. Pulmonic Valve: The pulmonic valve was grossly normal. Pulmonic valve regurgitation is not visualized. Pulmonic regurgitation is not visualized. Aorta: The aortic root and ascending aorta are structurally normal, with no evidence of dilitation. Venous: The inferior vena cava is normal in size with greater than 50% respiratory variability, suggesting right atrial pressure of 3 mmHg. IAS/Shunts: The interatrial septum was not assessed.  LEFT VENTRICLE  PLAX 2D LVIDd:         2.51 cm  Diastology LVIDs:         1.94 cm  LV e' lateral:   4.79 cm/s LV PW:         1.13 cm  LV E/e' lateral: 25.5 LV IVS:        1.37 cm  LV e' medial:    4.68 cm/s LVOT diam:     2.20 cm  LV E/e' medial:  26.1 LV SV:         11 ml LV SV Index:   6.16 LVOT Area:     3.80 cm  RIGHT VENTRICLE RV S prime:     17.10 cm/s TAPSE (M-mode): 2.3 cm LEFT ATRIUM             Index       RIGHT ATRIUM           Index LA diam:        3.00 cm 1.67 cm/m  RA Area:     11.30 cm LA Vol (A2C):   33.3 ml 18.49 ml/m RA Volume:   29.90 ml  16.60 ml/m LA Vol (A4C):   48.9 ml 27.15 ml/m LA Biplane Vol: 43.5 ml 24.15 ml/m   AORTA Ao Root diam: 2.90 cm MITRAL VALVE MV Area (PHT): 6.39 cm             SHUNTS MV PHT:        34.41 msec           Systemic Diam: 2.20 cm MV Decel Time: 119 msec MV E velocity: 122.00 cm/s 103 cm/s  Lyman Bishop MD Electronically signed by Lyman Bishop MD Signature Date/Time: 09/15/2019/11:33:28 AM    Final    CT HEAD CODE STROKE WO CONTRAST`  Result Date: 09/14/2019 CLINICAL DATA:  Code stroke.  Altered mental status EXAM: CT HEAD WITHOUT CONTRAST TECHNIQUE: Contiguous axial images were obtained from the base of the skull through the vertex without intravenous contrast. COMPARISON:  CT head 05/04/2019 FINDINGS: Brain: Moderate atrophy and moderate chronic microvascular ischemic changes in the white matter are stable. No acute infarct, hemorrhage, mass. No fluid  collection or midline shift. Vascular: Negative for hyperdense vessel Skull: Negative Sinuses/Orbits: Mild mucosal edema in the frontal sinus. Remaining sinuses clear. Negative orbit. Other: None ASPECTS ([place] Stroke Program Early CT Score) - Ganglionic level infarction (caudate, lentiform nuclei, internal capsule, insula, M1-M3 cortex): 7 - Supraganglionic infarction (M4-M6 cortex): 3 Total score (0-10 with 10 being normal): 10 IMPRESSION: 1. No acute abnormality 2. ASPECTS is 10 3. Atrophy and chronic microvascular ischemic changes stable from the prior study. 4. These results were called by telephone at the time of interpretation on 09/14/2019 at 3:01 pm to provider Rancour MD, who verbally acknowledged these results. Electronically Signed   By: Franchot Gallo M.D.   On: 09/14/2019 15:01     Scheduled Meds: .  stroke: mapping our early stages of recovery book   Does not apply Once  . aspirin EC  325 mg Oral Daily  . carvedilol  3.125 mg Oral BID WC  . enoxaparin (LOVENOX) injection  40 mg Subcutaneous Q24H  . [START ON 09/17/2019] levothyroxine  75 mcg Oral QAC breakfast  . mirtazapine  7.5 mg Oral QHS  . pantoprazole  40 mg Oral Daily  . pravastatin  20 mg Oral q1800  . vitamin B-12  1,000 mcg Oral Daily   Continuous Infusions: . dextrose 5 % and 0.9%  NaCl 75 mL/hr at 09/16/19 0600     LOS: 1 day    Time spent: 25 minutes spent in the coordination of care today.    Jonnie Finner, DO [facility] Pager [phone]  If 7PM-7AM, please contact night-coverage [email or web address] Password [email or web address] 09/16/2019, 2:42 PM

## 2019-09-16 NOTE — Progress Notes (Signed)
Occupational Therapy Evaluation Patient Details Name: Phillip Taylor MRN: [id number] DOB: 1925/05/10 Today's Date: 09/16/2019    History of Present Illness Phillip Taylor is an 85 y.o. male with history of atrial fibrillation on Eliquis, PE, CKD, carotid stenosis, HTN, hypothyroidism, vocal cord cancer s/p resection and dementia who presented from [place] for evaluation of AMS. Pt was having difficulty eating and speaking. Head CT was neg for acute abnormalities   Clinical Impression   PTA, pt was living at [facility], pt is a poor historian, per chart he required assistance from staff with ADL/IADL and and was ambulating at RW level. Pt currently requires modA for bed mobility and modA+2 for simulated toilet transfers taking about 5 steps. Due to decline in current level of function, pt would benefit from acute OT to address established goals to facilitate safe D/C to venue listed below. At this time, recommend SNF follow-up. Will continue to follow acutely.     Follow Up Recommendations  SNF;Supervision/Assistance - 24 hour    Equipment Recommendations  3 in 1 bedside commode    Recommendations for Other Services       Precautions / Restrictions Precautions Precautions: Fall Restrictions Weight Bearing Restrictions: No      Mobility Bed Mobility Overal bed mobility: Needs Assistance Bed Mobility: Supine to Sit     Supine to sit: Mod assist     General bed mobility comments: pt verbalized that he would come to EOB when instructed but made no move to actually mobilize. Once given tactile cues, was able to continue motion. Mod A for hips to EOB  Transfers Overall transfer level: Needs assistance Equipment used: Rolling walker (2 wheeled) Transfers: Sit to/from Stand Sit to Stand: Mod assist;+2 physical assistance         General transfer comment: mod A +2 for power up and to block feet, pt maintained flexed trunk and posterior bias, c/o mild dizziness     Balance Overall balance assessment: Needs assistance Sitting-balance support: Single extremity supported;Feet supported Sitting balance-Leahy Scale: Fair     Standing balance support: Bilateral upper extremity supported Standing balance-Leahy Scale: Poor Standing balance comment: heavily reliant on UE support                           ADL either performed or assessed with clinical judgement   ADL Overall ADL's : Needs assistance/impaired Eating/Feeding: Minimal assistance   Grooming: Minimal assistance   Upper Body Bathing: Minimal assistance   Lower Body Bathing: Moderate assistance   Upper Body Dressing : Minimal assistance   Lower Body Dressing: Moderate assistance   Toilet Transfer: Moderate assistance;RW;Stand-pivot Toilet Transfer Details (indicate cue type and reason): simulated Toileting- Clothing Manipulation and Hygiene: Maximal assistance       Functional mobility during ADLs: Moderate assistance;+2 for physical assistance;+2 for safety/equipment;Rolling walker General ADL Comments: modA+2 for safety with ADL and functional mobiltiy at [facility] level     Vision         Perception     Praxis      Pertinent Vitals/Pain Pain Assessment: Faces Faces Pain Scale: No hurt     Hand Dominance Right   Extremity/Trunk Assessment Upper Extremity Assessment Upper Extremity Assessment: Generalized weakness   Lower Extremity Assessment Lower Extremity Assessment: Defer to PT evaluation   Cervical / Trunk Assessment Cervical / Trunk Assessment: Kyphotic   Communication Communication Communication: HOH   Cognition Arousal/Alertness: Awake/alert Behavior During Therapy: Flat affect Overall  Cognitive Status: Impaired/Different from baseline Area of Impairment: Orientation;Attention;Memory;Following commands;Safety/judgement;Awareness;Problem solving                 Orientation Level: Disoriented to;Situation;Time;Place Current Attention  Level: Sustained Memory: Decreased recall of precautions Following Commands: Follows one step commands with increased time Safety/Judgement: Decreased awareness of deficits;Decreased awareness of safety Awareness: Intellectual Problem Solving: Slow processing;Decreased initiation;Requires verbal cues;Requires tactile cues;Difficulty sequencing General Comments: pt very slow to process, hearing likely also contributing to this   General Comments  HR in the 90s with mobility    Exercises     Shoulder Instructions      Home Living Family/patient expects to be discharged to:: Skilled nursing facility   Available Help at Discharge: [place] Type of Home: [place]                                  Prior Functioning/Environment Level of Independence: Needs assistance  Gait / Transfers Assistance Needed: pt unable to give any history but per chart, ambulated with a RW ADL's / Homemaking Assistance Needed: assist needed by staff            OT Problem List: Decreased strength;Decreased range of motion;Impaired balance (sitting and/or standing);Decreased activity tolerance;Decreased cognition;Decreased safety awareness;Decreased knowledge of precautions;Decreased knowledge of use of DME or AE      OT Treatment/Interventions: Self-care/ADL training;Therapeutic exercise;Energy conservation;DME and/or AE instruction;Therapeutic activities;Cognitive remediation/compensation;Balance training;Patient/family education    OT Goals(Current goals can be found in the care plan section) Acute Rehab OT Goals Patient Stated Goal: none stated OT Goal Formulation: With patient Time For Goal Achievement: 09/30/19 Potential to Achieve Goals: Good ADL Goals Pt Will Perform Grooming: with min assist;standing;sitting Pt Will Perform Upper Body Dressing: with min assist Pt Will Transfer to Toilet: with min guard assist Pt Will Perform Toileting - Clothing  Manipulation and hygiene: with min guard assist  OT Frequency: Min 2X/week   Barriers to D/C:            Co-evaluation PT/OT/SLP Co-Evaluation/Treatment: Yes Reason for Co-Treatment: Complexity of the patient's impairments (multi-system involvement);For patient/therapist safety;To address functional/ADL transfers PT goals addressed during session: Mobility/safety with mobility;Balance;Proper use of DME OT goals addressed during session: ADL's and self-care      AM-PAC OT "6 Clicks" Daily Activity     Outcome Measure Help from another person eating meals?: A Little Help from another person taking care of personal grooming?: A Little Help from another person toileting, which includes using toliet, bedpan, or urinal?: A Lot Help from another person bathing (including washing, rinsing, drying)?: A Little Help from another person to put on and taking off regular upper body clothing?: A Little Help from another person to put on and taking off regular lower body clothing?: A Lot 6 Click Score: 16   End of Session Equipment Utilized During Treatment: Gait belt;Rolling walker Nurse Communication: Mobility status  Activity Tolerance: Patient tolerated treatment well Patient left: in chair;with call bell/phone within reach;with chair alarm set  OT Visit Diagnosis: Unsteadiness on feet (R26.81);Other abnormalities of gait and mobility (R26.89);Muscle weakness (generalized) (M62.81);Other symptoms and signs involving cognitive function                Time: [id number] OT Time Calculation (min): 24 min Charges:  OT General Charges $OT Visit: 1 Visit OT Evaluation $OT Eval Moderate Complexity: [place] OTR/L [place] Office: [phone]  Wyn Forster 09/16/2019, 2:01 PM

## 2019-09-17 ENCOUNTER — Inpatient Hospital Stay (HOSPITAL_COMMUNITY): Payer: Medicare Other

## 2019-09-17 LAB — RENAL FUNCTION PANEL
Albumin: 2.5 g/dL — ABNORMAL LOW (ref 3.5–5.0)
Anion gap: 9 (ref 5–15)
BUN: 16 mg/dL (ref 8–23)
CO2: 21 mmol/L — ABNORMAL LOW (ref 22–32)
Calcium: 7.8 mg/dL — ABNORMAL LOW (ref 8.9–10.3)
Chloride: 109 mmol/L (ref 98–111)
Creatinine, Ser: 1.1 mg/dL (ref 0.61–1.24)
GFR calc Af Amer: >60 mL/min (ref >60)
GFR calc non Af Amer: 58 mL/min — ABNORMAL LOW (ref >60)
Glucose, Bld: 125 mg/dL — ABNORMAL HIGH (ref 70–99)
Phosphorus: 1.7 mg/dL — ABNORMAL LOW (ref 2.5–4.6)
Potassium: 2.9 mmol/L — ABNORMAL LOW (ref 3.5–5.1)
Sodium: 139 mmol/L (ref 135–145)

## 2019-09-17 LAB — CBC WITH DIFFERENTIAL/PLATELET
Abs Immature Granulocytes: 0.01 10*3/uL (ref 0.00–0.07)
Basophils Absolute: 0 10*3/uL (ref 0.0–0.1)
Basophils Relative: 1 %
Eosinophils Absolute: 0 10*3/uL (ref 0.0–0.5)
Eosinophils Relative: 0 %
HCT: 38.7 % — ABNORMAL LOW (ref 39.0–52.0)
Hemoglobin: 13.2 g/dL (ref 13.0–17.0)
Immature Granulocytes: 0 %
Lymphocytes Relative: 33 %
Lymphs Abs: 1.3 10*3/uL (ref 0.7–4.0)
MCH: 32.5 pg (ref 26.0–34.0)
MCHC: 34.1 g/dL (ref 30.0–36.0)
MCV: 95.3 fL (ref 80.0–100.0)
Monocytes Absolute: 0.3 10*3/uL (ref 0.1–1.0)
Monocytes Relative: 8 %
Neutro Abs: 2.3 10*3/uL (ref 1.7–7.7)
Neutrophils Relative %: 58 %
Platelets: 107 10*3/uL — ABNORMAL LOW (ref 150–400)
RBC: 4.06 MIL/uL — ABNORMAL LOW (ref 4.22–5.81)
RDW: 14 % (ref 11.5–15.5)
WBC: 4 10*3/uL (ref 4.0–10.5)
nRBC: 0 % (ref 0.0–0.2)

## 2019-09-17 LAB — BASIC METABOLIC PANEL
Anion gap: 10 (ref 5–15)
BUN: 15 mg/dL (ref 8–23)
CO2: 20 mmol/L — ABNORMAL LOW (ref 22–32)
Calcium: 7.5 mg/dL — ABNORMAL LOW (ref 8.9–10.3)
Chloride: 107 mmol/L (ref 98–111)
Creatinine, Ser: 0.89 mg/dL (ref 0.61–1.24)
GFR calc Af Amer: >60 mL/min (ref >60)
GFR calc non Af Amer: >60 mL/min (ref >60)
Glucose, Bld: 133 mg/dL — ABNORMAL HIGH (ref 70–99)
Potassium: 3.5 mmol/L (ref 3.5–5.1)
Sodium: 137 mmol/L (ref 135–145)

## 2019-09-17 LAB — SARS CORONAVIRUS 2 (TAT 6-24 HRS): SARS Coronavirus 2: NEGATIVE

## 2019-09-17 LAB — MAGNESIUM: Magnesium: 1.7 mg/dL (ref 1.7–2.4)

## 2019-09-17 MED ORDER — POTASSIUM PHOSPHATES 15 MMOLE/5ML IV SOLN
20.0000 mmol | Freq: Once | INTRAVENOUS | Status: AC
  Administered 2019-09-17: 20 mmol via INTRAVENOUS
  Filled 2019-09-17: qty 6.67

## 2019-09-17 MED ORDER — POTASSIUM CHLORIDE CRYS ER 20 MEQ PO TBCR
40.0000 meq | EXTENDED_RELEASE_TABLET | Freq: Once | ORAL | Status: AC
  Administered 2019-09-17: 40 meq via ORAL
  Filled 2019-09-17: qty 2

## 2019-09-17 NOTE — TOC Progression Note (Signed)
Transition of Care [facility]) - Progression Note    Patient Details  Name: Phillip Taylor MRN: [id number] Date of Birth: 07-11-1925  Transition of Care [facility]) CM/SW [place], [place] Phone Number: 09/17/2019, 5:13 PM  Clinical Narrative:    CSW spoke with Janett Billow, RN at [facility]. They feel they will be able to meet pt needs [facility] and therapy wise). They are requesting [place] orders for PT/OT and DME order for wheelchair to be ordered and delivered to ALF.   TOC team to f/u tomorrow to arrange final needs.    Expected Discharge Plan: Memory Care Barriers to Discharge: Continued Medical Work up  Expected Discharge Plan and Services Expected Discharge Plan: Memory Care In-house Referral: Clinical Social Work Discharge Planning Services: CM Consult   Living arrangements for the past 2 months: [place]  Readmission Risk Interventions Readmission Risk Prevention Plan 09/17/2019 05/07/2019 05/06/2019  Post Dischage Appt - - -  Appt Comments - - -  Medication Screening - - -  Transportation Screening Complete - Complete  PCP or Specialist Appt within 3-5 Days Not Complete (No Data) Complete  Not Complete comments facility resident - -  [place] or Home Care Consult Complete - Complete  Social Work Consult for [place] Planning/Counseling Complete - Complete  Palliative Care Screening Not Applicable - Complete  Medication Review Press photographer) Complete - Complete  Some recent data might be hidden

## 2019-09-17 NOTE — Progress Notes (Signed)
STROKE TEAM PROGRESS NOTE   INTERVAL HISTORY RN at bedside. Pt still hard of hearing and psychomotor slowing but able to answer questions and following commands. No focal deficit. CT repeat no acute finding. eliquis resumed. Low K today, under supplement.   Vitals:   09/17/19 0103 09/17/19 0316 09/17/19 0805 09/17/19 1130  BP: 133/62 (!) 143/93 (!) 153/77 (!) (P) 142/89  Pulse: 81 73 74 (P) 80  Resp: 16 16 16  (P) 18  Temp: 98.7 F (37.1 C) (!) 97.3 F (36.3 C) 98.4 F (36.9 C) (!) (P) 101.5 F (38.6 C)  TempSrc: Oral Oral Oral (P) Oral  SpO2: 96% 98%  (P) 96%  Weight:      Height:        CBC:  Recent Labs  Lab 09/15/19 0439 09/17/19 0340  WBC 4.5 4.0  NEUTROABS  --  2.3  HGB 13.4 13.2  HCT 40.8 38.7*  MCV 98.3 95.3  PLT 120* 107*    Basic Metabolic Panel:  Recent Labs  Lab 09/15/19 0439 09/17/19 0340  NA 138 139  K 3.5 2.9*  CL 105 109  CO2 23 21*  GLUCOSE 130* 125*  BUN 25* 16  CREATININE 0.95 1.10  CALCIUM 8.0* 7.8*  MG  --  1.7  PHOS  --  1.7*   Lipid Panel:     Component Value Date/Time   CHOL 135 09/15/2019 0439   TRIG 121 09/15/2019 0439   HDL 25 (L) 09/15/2019 0439   CHOLHDL 5.4 09/15/2019 0439   VLDL 24 09/15/2019 0439   LDLCALC 86 09/15/2019 0439   HgbA1c:  Lab Results  Component Value Date   HGBA1C 5.6 09/15/2019   Urine Drug Screen:     Component Value Date/Time   LABOPIA NONE DETECTED 09/14/2019 1421   COCAINSCRNUR NONE DETECTED 09/14/2019 1421   LABBENZ NONE DETECTED 09/14/2019 1421   AMPHETMU NONE DETECTED 09/14/2019 1421   THCU NONE DETECTED 09/14/2019 1421   LABBARB NONE DETECTED 09/14/2019 1421    Alcohol Level     Component Value Date/Time   ETH <10 09/14/2019 1421    IMAGING CT HEAD WO CONTRAST  Result Date: 09/17/2019 CLINICAL DATA:  Follow-up examination for acute stroke. EXAM: CT HEAD WITHOUT CONTRAST TECHNIQUE: Contiguous axial images were obtained from the base of the skull through the vertex without  intravenous contrast. COMPARISON:  Prior CT from 09/14/2019. FINDINGS: Brain: Atrophy with chronic microvascular ischemic disease again noted, stable. No acute intracranial hemorrhage. No visible acute or evolving large vessel territory infarct. No mass lesion, midline shift or mass effect. Stable ventricular size without hydrocephalus. No extra-axial fluid collection. Vascular: No hyperdense vessel. Calcified atherosclerosis present at skull base. Skull: Scalp soft tissues and calvarium within normal limits. Sinuses/Orbits: Globes orbital soft tissues demonstrate no acute finding. Chronic frontal sinus disease noted. Paranasal sinuses are otherwise clear. No mastoid effusion. Other: None. IMPRESSION: 1. Stable head CT. No visible acute or evolving ischemic infarct identified. 2. Atrophy with chronic microvascular ischemic disease. Electronically Signed   By: Jeannine Boga M.D.   On: 09/17/2019 02:12   EEG adult  Result Date: 09/17/2019 Lora Havens, MD     09/17/2019  9:35 AM Patient Name: MARQUET LAURO MRN: [id number] Epilepsy Attending: Lora Havens Referring Physician/Provider: Dr Rosalin Hawking Date: 09/16/2019 Duration: 28.52 mins Patient history: 85yo M with h/o dementia who presented with speech abnormality, difficulty walking and ams. EEG to evaluate for seizure Level of alertness: awake AEDs during EEG study: None Technical  aspects: This EEG study was done with scalp electrodes positioned according to the 10-20 International system of electrode placement. Electrical activity was acquired at a sampling rate of 500Hz  and reviewed with a high frequency filter of 70Hz  and a low frequency filter of 1Hz . EEG data were recorded continuously and digitally stored. DESCRIPTION:  The posterior dominant rhythm consists of 7 Hz activity of moderate voltage (25-35 uV) seen predominantly in posterior head regions, symmetric and reactive to eye opening. EEG showed continuous generalized 3-5hz  theta-delta  slowing. Hyperventilation and photic stimulation were not performed. ABNORMALITY - Continuous slow, generalized - Background slow IMPRESSION: This study is suggestive of mild diffuse encephalopathy, non specific to etiology. No seizures or epileptiform discharges were seen throughout the recording. Priyanka Barbra Sarks    PHYSICAL EXAM  Temp:  [97.3 F (36.3 C)-101.5 F (38.6 C)] (P) 101.5 F (38.6 C) (12/15 1130) Pulse Rate:  [73-81] (P) 80 (12/15 1130) Resp:  [16-18] (P) 18 (12/15 1130) BP: (133-153)/(62-93) (P) 142/89 (12/15 1130) SpO2:  [96 %-98 %] (P) 96 % (12/15 1130)  General - Well nourished, well developed, in no apparent distress.  Ophthalmologic - fundi not visualized due to noncooperation.  Cardiovascular - Regular rhythm and rate, not in afib.  Neuro - awake alert, orientated to self and age, but not to time, place or people. Paucity of speech, able to speak in short sentences, no aphasia. Psychomotor slowing. Mild dysarthria, able to follow simple commands. But severe hard of hearing. Able to name 2/4 and repeat. Eyes tracking bilaterally, no gaze palsy, visual field full, blinking to visual threat bilaterally. Facial symmetric, tongue midline. BUE 4/5 and BLE 3-/5. Sensation symmetrical. Coordination not cooperative. DTR 1+ and no babinski. Gait not tested.     ASSESSMENT/PLAN Mr. JAMERIUS REAVIS is a 85 y.o. male with history of atrial fibrillation on Eliquis, PE, CKD, carotid stenosis, HTN, hypothyroidism, vocal cord cancer s/p resection and dementia who presented from [place] for evaluation of AMS and trouble walking with his walker.  Likely encephalopathy in the setting of dementia and dehydration  Code Stroke CT head No acute abnormality. Small vessel disease. Atrophy. ASPECTS 10.     CTA head & neck R ICA 50% stenosis w/ 24mm plaque ulceration.  CT perfusion delayed perfusion B temporal lobes.  Repeat CT stable, no acute abnormality   EEG no sz, mild  diffuse encephalopathy   2D Echo EF 60-65%. No source of embolus   LDL 86  HgbA1c 5.6  Lovenox 40 mg sq daily for VTE prophylaxis Eliquis (apixaban) daily prior to admission, now resumed eliquis. Continue on discharge.   Therapy recommendations:  SNF   Disposition:  pending (from [facility])  PAF Hx PE  Home anticoagulation:  Eliquis (apixaban) daily   Last INR 1.2  Resumed eliquis, continue on discharge    Dehydration  BUN baseline 21  This admission BUN 34->25  Cre 1.15->0.95->1.10   On IVF  Hypertension  Stable . On coreg PTA, resume . Long-term BP goal normotensive  Hyperlipidemia  Home meds:  No statin  LDL 86, goal < 70  Put on pravastatin 20  Continue statin on discharge  Other Stroke Risk Factors  Advanced age  Hx carotid stenosis - this admission CTA right ICA 50% stenosis  Other Active Problems  Baseline dementia w/ behavioral disturbance   Fever 100.8 in ED. UA & CXR ok. Abx stopped  Hypothyroidism  BPH  Hx erosive gastropathy/duodenal ulcer  Hx vocal cord  cancer s/p resection and XRT 1995  Hypokalemia 3.5->2.9 - supplement  Hospital day # 2  Neurology will sign off. Please call with questions. No stroke clinic follow up needed at this time. Thanks for the consult.   Rosalin Hawking, MD PhD Stroke Neurology 09/17/2019 2:06 PM    To contact Stroke Continuity provider, please refer to [email or web address] After hours, contact General Neurology

## 2019-09-17 NOTE — Progress Notes (Signed)
  Speech Language Pathology Treatment: Dysphagia  Patient Details Name: Phillip Taylor MRN: [id number] DOB: Mar 30, 1925 Today's Date: 09/17/2019 Time: 0850-0920 SLP Time Calculation (min) (ACUTE ONLY): 30 min  Assessment / Plan / Recommendation Clinical Impression  Pt was encountered awake, but lethargic in bed and he was agreeable to ST treatment session.  Pt was seen with trials of thin liquid via cup sip, puree, ground solids, soft solids, and medication crushed in puree (administered by RN).  Pt was observed to have low vocal intensity and hoarse vocal quality upon SLP arrival.  He was able to hold the cup independently; however, he required tactile assistance with tilting the cup towards his oral cavity.  Pt independently took small, single sips of water and he exhibited a gurgling sound and a delayed throat clear following 4/10 trials.  Suspect esophageal etiology.  Pt may be at an increased risk for post-prandial aspiration.  Pt with good bolus acceptance of solids via spoon, and he tolerated all solid trials without overt s/sx of aspiration.  He exhibited prolonged bolus formation and prolonged AP transport with all solid trials, and he presented with prolonged mastication of ground and soft solid trials.  Pt and RN both reported that the pt was tolerating his current diet of Dysphagia 2 (ground) solids without difficulty.  Therefore, recommend continuation of Dysphagia 2 solids and thin liquids with the following precautions: 1) Small bites/sips 2) Slow rate of intake 3) Sit upright 90 degrees, 4) Remain upright for at least 30 minutes following po intake.  Pt will continue to require full assistance for po intake and to cue for compensatory strategies.  SLP will continue to f/u per POC.     HPI HPI: Pt is a 85 yo male admitted from SNF with AMS, not speaking. CT Head did not show acute changes; per MD note, exam is more consistent with cognitive impairment than stroke. CXR also without acute  findings. Pt had a swallow evaluation in June 2020 with hoarse vocal quality and coughing with dry solids and straw. Dys 3 diet and thin liquids recommended Pt had reported at that time that he got "strangled easily." PMH includes: laryngeal ca s/p resection and XRT, GERD, HTN, carotid stenosis, CKD, A fib, on Eliquis, dementia, HTN, anemia, PE      SLP Plan  Continue with current plan of care       Recommendations  Diet recommendations: Dysphagia 2 (fine chop);Thin liquid Liquids provided via: Cup Medication Administration: Crushed with puree Supervision: Full supervision/cueing for compensatory strategies;Staff to assist with self feeding Compensations: Slow rate;Small sips/bites;Follow solids with liquid Postural Changes and/or Swallow Maneuvers: Seated upright 90 degrees;Upright 30-60 min after meal                Oral Care Recommendations: Oral care QID Follow up Recommendations: Skilled Nursing facility SLP Visit Diagnosis: Dysphagia, unspecified (R13.10) Plan: Continue with current plan of care       [place] 09/17/2019, 9:31 AM

## 2019-09-17 NOTE — Progress Notes (Addendum)
PROGRESS NOTE    Phillip Taylor  [id number] DOB: 1924-11-25 DOA: 09/14/2019 PCP: [name], MD   Brief Narrative: 85 year old male with dementia, history of PE, carotid artery stenosis, A. fib, CKD stage III, BPH, hypertension, vocal cord cancer brought to ER from [place] home where he was admitted X2 weeks and noticed to have altered mental status, not communicating and having trouble ambulating Patient much more alert awake working with PT OT SLP.  Subjective: Alert,awake, weak/frail Able to tell me his name and DOB with faint speech, on RA. Not able to tell current Date. Follows some commands but no all.  Assessment & Plan:  Difficulty with speech/communication/ambulation: suspecting acute metabolic encephalopathy in the setting of dementia and dehydration.Initially admitted for stroke work-up with code stroke, seen by neurology and follwing closely. Repeat CT head no acute finding,   CT head and neck right ICA 50% stenosis with 4 mm plaque ulceration, CT perfusion delayed perfusion in B temporal lobes, repeat CT head no acute finding, EEG background slowing no acute seizure, 2D echo EF 60 to 50%, LDL 86, hemoglobin A1c 5.6.  Plan is to continue PT OT modified diet per speech, skilled nursing placement placement.  Continues home Eliquis, pravastatin.  Dysphagia: SLP eval appreciated cont :Dysphagia 2 (fine chop);Thin liquid  meds crushed with puree.Full supervision/cueing for compensatory strategies;Staff to assist with self feeding ,seat upright 90 degrees;Upright 30-60 min after meal  Hypokalemia, severe 2.9 and hypophosphatemia with phosphorus 1.7.  Ordered for iv K-Phos and oral supplementation. Repeat labs later today and in am.  Metabolic acidosis bicarbonate 21. Monitor  Transient fever of 100.8 in ER UA and chest x-ray no acute infection noted.Off antibiotics monitoring.  PAF/history of [id number] patient's Coreg and Eliquis.  Dehydrationwith  BUN 34 on  admission improved with IV fluids.  Creatinine improved to 0.9.  Essential hypertension blood pressure stable continue Coreg.  Hyperlipidemia LDL 86 goal less than 70, not any statins placed on pravastatin.  Dementia baseline continue supportive care.  Patient will need a skilled nursing facility.  He has been on memory care unit for past 2 weeks.  Continue fall precaution, supportive care.  Hypothyroidism:Continue home Synthroid  BPH-not on meds.History of vocal cord cancer status post resection and XRT in 1995,  History of erosive gastropathy/duodenal ulcer: continue Protonix.  Body mass index is 18.27 kg/m.   DVT prophylaxis: Eliquis. Code Status: DNR Family Communication: plan of care discussed with patient's son over the phone. D/w RN. Disposition Plan: Remains inpatient pending clinical improvement.  Son desires the patient to return to [facility] if they can take him. Education officer, museum looking into return to [employer] we found out will order COVID/  Consultants: neurology Procedures: EEG Microbiology: Blood cultures no growth, urine culture multiple species.  Antimicrobials: Anti-infectives (From admission, onward)    Start     Dose/Rate Route Frequency Ordered Stop   09/15/19 1400  vancomycin (VANCOCIN) 500 mg in sodium chloride 0.9 % 100 mL IVPB  Status:  Discontinued     500 mg 100 mL/hr over 60 Minutes Intravenous Every 12 hours 09/15/19 0914 09/15/19 1328   09/14/19 2345  vancomycin (VANCOCIN) 1,500 mg in sodium chloride 0.9 % 500 mL IVPB     1,500 mg 250 mL/hr over 120 Minutes Intravenous  Once 09/14/19 2301 09/15/19 0410   09/14/19 2315  aztreonam (AZACTAM) 1 g in sodium chloride 0.9 % 100 mL IVPB  Status:  Discontinued     1 g 200 mL/hr over  30 Minutes Intravenous Every 8 hours 09/14/19 2302 09/15/19 1328        Objective: Vitals:   09/16/19 1202 09/17/19 0103 09/17/19 0316 09/17/19 0805  BP: 136/73 133/62 (!) 143/93 (!) 153/77  Pulse: 89 81 73 74   Resp: 14 16 16 16   Temp: 98.4 F (36.9 C) 98.7 F (37.1 C) (!) 97.3 F (36.3 C) 98.4 F (36.9 C)  TempSrc: Oral Oral Oral Oral  SpO2: 99% 96% 98%   Weight:      Height:        Intake/Output Summary (Last 24 hours) at 09/17/2019 0839 Last data filed at 09/17/2019 0600 Gross per 24 hour  Intake 1718.87 ml  Output 500 ml  Net 1218.87 ml   Filed Weights   09/14/19 1319  Weight: 61.1 kg   Weight change:   Body mass index is 18.27 kg/m.  Intake/Output from previous day: 12/14 0701 - 12/15 0700 In: 1718.9 [I.V.:1718.9] Out: 500 [Urine:500] Intake/Output this shift: No intake/output data recorded.  Examination:  General exam: AA oriented to self, not to date/place,NAD. HEENT:Oral mucosa moist, Ear/Nose WNL grossly, dentition normal. Respiratory system: Diminished at the base,no wheezing or crackles,no use of accessory muscle Cardiovascular system: S1 & S2 +, No JVD,. Gastrointestinal system: Abdomen soft, NT,ND, BS+ Nervous System:Alert, awake, moving extremities and grossly nonfocal Extremities: No edema, distal peripheral pulses palpable.  Skin: No rashes,no icterus. MSK: Normal muscle bulk,tone, power  Medications:  Scheduled Meds:   stroke: mapping our early stages of recovery book   Does not apply Once   apixaban  5 mg Oral BID   carvedilol  3.125 mg Oral BID WC   levothyroxine  75 mcg Oral QAC breakfast   mirtazapine  7.5 mg Oral QHS   pantoprazole  40 mg Oral Daily   pravastatin  20 mg Oral q1800   vitamin B-12  1,000 mcg Oral Daily   Continuous Infusions:  dextrose 5 % and 0.9% NaCl 75 mL/hr at 09/17/19 0600    Data Reviewed: I have personally reviewed following labs and imaging studies  CBC: Recent Labs  Lab 09/14/19 1415 09/15/19 0439 09/17/19 0340  WBC 5.0 4.5 4.0  NEUTROABS  --   --  2.3  HGB 15.7 13.4 13.2  HCT 47.5 40.8 38.7*  MCV 98.1 98.3 95.3  PLT 148* 120* [id number]*   Basic Metabolic Panel: Recent Labs  Lab 09/14/19 1415  09/15/19 0439 09/17/19 0340  NA 138 138 139  K 4.3 3.5 2.9*  CL 103 105 109  CO2 23 23 21*  GLUCOSE 131* 130* 125*  BUN 34* 25* 16  CREATININE 1.15 0.95 1.10  CALCIUM 8.7* 8.0* 7.8*  MG  --   --  1.7  PHOS  --   --  1.7*   GFR: Estimated Creatinine Clearance: 37 mL/min (by C-G formula based on SCr of 1.1 mg/dL). Liver Function Tests: Recent Labs  Lab 09/14/19 1415 09/17/19 0340  AST 71*  --   ALT 41  --   ALKPHOS 162*  --   BILITOT 1.3*  --   PROT 7.8  --   ALBUMIN 3.9 2.5*   No results for input(s): LIPASE, AMYLASE in the last 168 hours. No results for input(s): AMMONIA in the last 168 hours. Coagulation Profile: Recent Labs  Lab 09/14/19 1421  INR 1.2   Cardiac Enzymes: No results for input(s): CKTOTAL, CKMB, CKMBINDEX, TROPONINI in the last 168 hours. BNP (last 3 results) No results for input(s): PROBNP in the last  8760 hours. HbA1C: Recent Labs    09/15/19 0439  HGBA1C 5.6   CBG: Recent Labs  Lab 09/14/19 1358 09/16/19 0024  GLUCAP 129* 112*   Lipid Profile: Recent Labs    09/15/19 0439  CHOL 135  HDL 25*  LDLCALC 86  TRIG 121  CHOLHDL 5.4   Thyroid Function Tests: Recent Labs    09/14/19 1415  TSH 3.385   Anemia Panel: No results for input(s): VITAMINB12, FOLATE, FERRITIN, TIBC, IRON, RETICCTPCT in the last 72 hours. Sepsis Labs: Recent Labs  Lab 09/14/19 1431 09/14/19 1636  LATICACIDVEN 2.2* 1.6    Recent Results (from the past 240 hour(s))  Urine culture     Status: Abnormal   Collection Time: 09/14/19  1:37 PM   Specimen: Urine, Clean Catch  Result Value Ref Range Status   Specimen Description   Final    URINE, CLEAN CATCH Performed at [facility], [address], [place], [place] [address]    Special Requests   Final    NONE Performed at [facility], [address], [place], [place] [address]    Culture MULTIPLE SPECIES PRESENT, SUGGEST RECOLLECTION (A)  Final   Report Status 09/16/2019 FINAL  Final  Blood culture  (routine x 2)     Status: None (Preliminary result)   Collection Time: 09/14/19  1:48 PM   Specimen: Right Antecubital; Blood  Result Value Ref Range Status   Specimen Description   Final    RIGHT ANTECUBITAL BOTTLES DRAWN AEROBIC AND ANAEROBIC   Special Requests Blood Culture adequate volume  Final   Culture   Final    NO GROWTH 3 DAYS Performed at [facility], [address], [place], [place] [address]    Report Status PENDING  Incomplete  Blood culture (routine x 2)     Status: None (Preliminary result)   Collection Time: 09/14/19  2:15 PM   Specimen: Right Antecubital; Blood  Result Value Ref Range Status   Specimen Description RIGHT ANTECUBITAL BOTTLES DRAWN AEROBIC ONLY  Final   Special Requests Blood Culture adequate volume  Final   Culture   Final    NO GROWTH 3 DAYS Performed at [facility], [address] [place], [place] [address]    Report Status PENDING  Incomplete  SARS CORONAVIRUS 2 (TAT 6-24 HRS) Nasopharyngeal Nasopharyngeal Swab     Status: None   Collection Time: 09/14/19  4:06 PM   Specimen: Nasopharyngeal Swab  Result Value Ref Range Status   SARS Coronavirus 2 NEGATIVE NEGATIVE Final    Comment: (NOTE) SARS-CoV-2 target nucleic acids are NOT DETECTED. The SARS-CoV-2 RNA is generally detectable in upper and lower respiratory specimens during the acute phase of infection. Negative results do not preclude SARS-CoV-2 infection, do not rule out co-infections with other pathogens, and should not be used as the sole basis for treatment or other patient management decisions. Negative results must be combined with clinical observations, patient history, and epidemiological information. The expected result is Negative. Fact Sheet for Patients: SugarRoll.be Fact Sheet for Healthcare Providers: https://www.woods-mathews.com/ This test is not yet approved or cleared by the [place] FDA and  has been authorized for  detection and/or diagnosis of SARS-CoV-2 by FDA under an Emergency Use Authorization (EUA). This EUA will remain  in effect (meaning this test can be used) for the duration of the COVID-19 declaration under Section 56 4(b)(1) of the Act, 21 U.S.C. section 360bbb-3(b)(1), unless the authorization is terminated or revoked sooner. Performed at [facility], [address], [place], [place] [address]   MRSA PCR Screening     Status: None   Collection Time: 09/15/19  4:13 PM   Specimen: Nasopharyngeal  Result Value Ref Range Status   MRSA by PCR NEGATIVE NEGATIVE Final    Comment:        The GeneXpert MRSA Assay (FDA approved for NASAL specimens only), is one component of a comprehensive MRSA colonization surveillance program. It is not intended to diagnose MRSA infection nor to guide or monitor treatment for MRSA infections. Performed at [facility], [place] [address], [place], [place] [address]       Radiology Studies: CT HEAD WO CONTRAST  Result Date: 09/17/2019 CLINICAL DATA:  Follow-up examination for acute stroke. EXAM: CT HEAD WITHOUT CONTRAST TECHNIQUE: Contiguous axial images were obtained from the base of the skull through the vertex without intravenous contrast. COMPARISON:  Prior CT from 09/14/2019. FINDINGS: Brain: Atrophy with chronic microvascular ischemic disease again noted, stable. No acute intracranial hemorrhage. No visible acute or evolving large vessel territory infarct. No mass lesion, midline shift or mass effect. Stable ventricular size without hydrocephalus. No extra-axial fluid collection. Vascular: No hyperdense vessel. Calcified atherosclerosis present at skull base. Skull: Scalp soft tissues and calvarium within normal limits. Sinuses/Orbits: Globes orbital soft tissues demonstrate no acute finding. Chronic frontal sinus disease noted. Paranasal sinuses are otherwise clear. No mastoid effusion. Other: None. IMPRESSION: 1. Stable head CT. No visible  acute or evolving ischemic infarct identified. 2. Atrophy with chronic microvascular ischemic disease. Electronically Signed   By: Jeannine Boga M.D.   On: 09/17/2019 02:12   ECHOCARDIOGRAM COMPLETE  Result Date: 09/15/2019   ECHOCARDIOGRAM REPORT   Patient Name:   Phillip Taylor Date of Exam: 09/15/2019 Medical Rec #:  [id number]    Height:       72.0 in Accession #:    [id number]   Weight:       134.7 lb Date of Birth:  1925/03/19     BSA:          1.80 m Patient Age:    86 years     BP:           160/76 mmHg Patient Gender: M            HR:           85 bpm. Exam Location:  [facility] Procedure: 2D Echo Indications:    Stroke 434.91 / I163.9  History:        Patient has prior history of Echocardiogram examinations, most                 recent 05/05/2019. Arrythmias:Atrial Fibrillation; Risk                 Factors:Non-Smoker and Hypertension. Pulmonary embolism ,.  Sonographer:    Leavy Cella RDCS (AE) Referring Phys: [place]  1. Left ventricular ejection fraction, by visual estimation, is 60 to 65%. The left ventricle has normal function. Left ventricular septal wall thickness was severely increased. There is mildly increased left ventricular hypertrophy.  2. Left ventricular diastolic function could not be evaluated.  3. The left ventricle has no regional wall motion abnormalities.  4. Global right ventricle has normal systolic function.The right ventricular size is normal. No increase in right ventricular wall thickness.  5. Left atrial size was normal.  6. Right atrial size was normal.  7. The mitral valve is grossly normal. Trivial  mitral valve regurgitation.  8. The tricuspid valve is not well visualized. Tricuspid valve regurgitation is trivial.  9. The aortic valve is tricuspid. Aortic valve regurgitation is trivial. Mild aortic valve sclerosis without stenosis. 10. The pulmonic valve was grossly normal. Pulmonic valve regurgitation is not visualized. 11. The  inferior vena cava is normal in size with greater than 50% respiratory variability, suggesting right atrial pressure of 3 mmHg. 12. The interatrial septum was not assessed. FINDINGS  Left Ventricle: Left ventricular ejection fraction, by visual estimation, is 60 to 65%. The left ventricle has normal function. The left ventricle has no regional wall motion abnormalities. There is mildly increased left ventricular hypertrophy. The left ventricular diastology could not be evaluated due to atrial fibrillation. Left ventricular diastolic function could not be evaluated. Right Ventricle: The right ventricular size is normal. No increase in right ventricular wall thickness. Global RV systolic function is has normal systolic function. Left Atrium: Left atrial size was normal in size. Right Atrium: Right atrial size was normal in size Pericardium: There is no evidence of pericardial effusion. Mitral Valve: The mitral valve is grossly normal. Trivial mitral valve regurgitation. Tricuspid Valve: The tricuspid valve is not well visualized. Tricuspid valve regurgitation is trivial. Aortic Valve: The aortic valve is tricuspid. Aortic valve regurgitation is trivial. Mild aortic valve sclerosis is present, with no evidence of aortic valve stenosis. Pulmonic Valve: The pulmonic valve was grossly normal. Pulmonic valve regurgitation is not visualized. Pulmonic regurgitation is not visualized. Aorta: The aortic root and ascending aorta are structurally normal, with no evidence of dilitation. Venous: The inferior vena cava is normal in size with greater than 50% respiratory variability, suggesting right atrial pressure of 3 mmHg. IAS/Shunts: The interatrial septum was not assessed.  LEFT VENTRICLE PLAX 2D LVIDd:         2.51 cm  Diastology LVIDs:         1.94 cm  LV e' lateral:   4.79 cm/s LV PW:         1.13 cm  LV E/e' lateral: 25.5 LV IVS:        1.37 cm  LV e' medial:    4.68 cm/s LVOT diam:     2.20 cm  LV E/e' medial:  26.1 LV  SV:         11 ml LV SV Index:   6.16 LVOT Area:     3.80 cm  RIGHT VENTRICLE RV S prime:     17.10 cm/s TAPSE (M-mode): 2.3 cm LEFT ATRIUM             Index       RIGHT ATRIUM           Index LA diam:        3.00 cm 1.67 cm/m  RA Area:     11.30 cm LA Vol (A2C):   33.3 ml 18.49 ml/m RA Volume:   29.90 ml  16.60 ml/m LA Vol (A4C):   48.9 ml 27.15 ml/m LA Biplane Vol: 43.5 ml 24.15 ml/m   AORTA Ao Root diam: 2.90 cm MITRAL VALVE MV Area (PHT): 6.39 cm             SHUNTS MV PHT:        34.41 msec           Systemic Diam: 2.20 cm MV Decel Time: 119 msec MV E velocity: 122.00 cm/s 103 cm/s  Lyman Bishop MD Electronically signed by Lyman Bishop MD Signature Date/Time: 09/15/2019/11:33:28 AM  Final       LOS: 2 days   Time spent: More than 50% of that time was spent in counseling and/or coordination of care.  Antonieta Pert, MD [facility]  09/17/2019, 8:39 AM

## 2019-09-17 NOTE — TOC Progression Note (Signed)
Transition of Care [facility]) - Progression Note    Patient Details  Name: JOCOB ZALOUDEK MRN: [id number] Date of Birth: 24-Jan-1925  Transition of Care [facility]) CM/SW [place], [place] Phone Number: 09/17/2019, 10:58 AM  Clinical Narrative:    CSW received a call back from pt son [name], pt from [place], he had just transitioned to memory care as of two weeks ago. Pt son would like for him to return to [facility] if they feel that is safe. If not then pt referral has been sent to [facility] at pt son request. [facility] team continues to follow.    Expected Discharge Plan: Skilled Nursing Facility Barriers to Discharge: Continued Medical Work up  Expected Discharge Plan and Services Expected Discharge Plan: [place] In-house Referral: Clinical Social Work Discharge Planning Services: CM Consult   Living arrangements for the past 2 months: [place]  Readmission Risk Interventions Readmission Risk Prevention Plan 09/17/2019 05/07/2019 05/06/2019  Post Dischage Appt - - -  Appt Comments - - -  Medication Screening - - -  Transportation Screening Complete - Complete  PCP or Specialist Appt within 3-5 Days Not Complete (No Data) Complete  Not Complete comments facility resident - -  [place] or Home Care Consult Complete - Complete  Social Work Consult for Recovery Care Planning/Counseling Complete - Complete  Palliative Care Screening Not Applicable - Complete  Medication Review Press photographer) Complete - Complete  Some recent data might be hidden

## 2019-09-17 NOTE — Procedures (Addendum)
Patient Name: Phillip Taylor  MRN: [id number]  Epilepsy Attending: Lora Havens  Referring Physician/Provider: Dr Rosalin Hawking Date: 09/16/2019  Duration: 28.52 mins  Patient history: 85yo M with h/o dementia who presented with speech abnormality, difficulty walking and ams. EEG to evaluate for seizure  Level of alertness: awake  AEDs during EEG study: None  Technical aspects: This EEG study was done with scalp electrodes positioned according to the 10-20 International system of electrode placement. Electrical activity was acquired at a sampling rate of 500Hz  and reviewed with a high frequency filter of 70Hz  and a low frequency filter of 1Hz . EEG data were recorded continuously and digitally stored.   DESCRIPTION:  The posterior dominant rhythm consists of 7 Hz activity of moderate voltage (25-35 uV) seen predominantly in posterior head regions, symmetric and reactive to eye opening. EEG showed continuous generalized 3-5hz  theta-delta slowing. Hyperventilation and photic stimulation were not performed.  ABNORMALITY - Continuous slow, generalized - Background slow  IMPRESSION: This study is suggestive of mild diffuse encephalopathy, non specific to etiology. No seizures or epileptiform discharges were seen throughout the recording.  Rea Reser Barbra Sarks

## 2019-09-17 NOTE — NC FL2 (Signed)
[place] LEVEL OF CARE SCREENING TOOL     IDENTIFICATION  Patient Name: Phillip Taylor Birthdate: 1925/09/30 Sex: male Admission Date (Current Location): 09/14/2019  [facility] and [place] Number:  [employer] and Address:  [facility] [place], [place] [address], [place], [place] [address]      Provider Number: [id number]  Attending Physician Name and Address:  Antonieta Pert, MD  Relative Name and Phone Number:       Current Level of Care: Hospital Recommended Level of Care: [place] Prior Approval Number:    Date Approved/Denied:   PASRR Number: [id number]  Discharge Plan: SNF    Current Diagnoses: Patient Active Problem List   Diagnosis Date Noted  . Stroke-like symptom 09/15/2019  . Speech abnormality 09/14/2019  . Acute parotitis 05/06/2019  . CKD (chronic kidney disease) stage 3, GFR 30-59 ml/min 05/05/2019  . Pulmonary embolism ([place]) 05/04/2019  . Atrial fibrillation ([place]) 05/04/2019  . GERD without esophagitis 04/17/2019  . Poor appetite 04/09/2019  . Dysphagia 04/09/2019  . Weight loss 04/07/2019  . Protein-calorie malnutrition, severe ([place]) 03/30/2019  . Elevated liver enzymes 03/29/2019  . Vaso vagal episode 03/29/2019  . Hypokalemia 03/20/2019  . Chronic constipation 03/12/2019  . Carotid artery stenosis 03/12/2019  . Carotid sinus hypersensitivity 03/12/2019  . Hypocalcemia 03/12/2019  . Acute blood loss anemia 03/12/2019  . Dementia without behavioral disturbance ([place]) 03/12/2019  . Intertrochanteric fracture of right femur, closed, initial encounter ([place]) 03/09/2019  . Acute dehydration 03/09/2019  . Osteoporosis 03/09/2019  . Essential hypertension 03/09/2019  . Hypothyroidism 03/09/2019  . Intertrochanteric fracture ([place]) 03/09/2019  . Heme positive stool 10/17/2012    Orientation RESPIRATION BLADDER Height & Weight     Self  Normal Incontinent, External catheter Weight: 134 lb 11.2 oz  (61.1 kg) Height:  6' (182.9 cm)  BEHAVIORAL SYMPTOMS/MOOD NEUROLOGICAL BOWEL NUTRITION STATUS      Continent Diet(see discharge summary)  AMBULATORY STATUS COMMUNICATION OF NEEDS Skin   Extensive Assist Verbally Other (Comment)(abrasion left knee with foam; generalized ecchymosis)                       Personal Care Assistance Level of Assistance  Bathing, Dressing, Feeding Bathing Assistance: Maximum assistance Feeding assistance: Limited assistance Dressing Assistance: Maximum assistance     Functional Limitations Info  Sight, Hearing, Speech Sight Info: Adequate Hearing Info: Adequate Speech Info: Adequate    SPECIAL CARE FACTORS FREQUENCY  OT (By licensed OT), PT (By licensed PT)     PT Frequency: 5x week OT Frequency: 5x week            Contractures Contractures Info: Not present    Additional Factors Info  Code Status, Allergies, Psychotropic, Isolation Precautions Code Status Info: DNR Allergies Info: Prevacid (Lansoprazole), Prilosec (Omeprazole), Codeine, Penicillins Psychotropic Info: mirtazapine (REMERON) tablet 7.5 mg daily at bedtime PO for appetite   Isolation Precautions Info: MRSA     Current Medications (09/17/2019):  This is the current hospital active medication list Current Facility-Administered Medications  Medication Dose Route Frequency Provider Last Rate Last Admin  .  stroke: mapping our early stages of recovery book   Does not apply Once Bethena Roys, MD   Stopped at 09/14/19 2046  . acetaminophen (TYLENOL) tablet 650 mg  650 mg Oral Q4H PRN Emokpae, Ejiroghene E, MD       Or  . acetaminophen (TYLENOL) 160 MG/5ML solution 650 mg  650 mg  Per Tube Q4H PRN Emokpae, Ejiroghene E, MD       Or  . acetaminophen (TYLENOL) suppository 650 mg  650 mg Rectal Q4H PRN Emokpae, Ejiroghene E, MD      . apixaban (ELIQUIS) tablet 5 mg  5 mg Oral BID Marylyn Ishihara, Tyrone A, DO   5 mg at 09/16/19 2241  . carvedilol (COREG) tablet 3.125 mg  3.125 mg  Oral BID WC Rosalin Hawking, MD   3.125 mg at 09/16/19 1857  . dextrose 5 %-0.9 % sodium chloride infusion   Intravenous Continuous Emokpae, Ejiroghene E, MD 75 mL/hr at 09/17/19 0600 Rate Verify at 09/17/19 0600  . levothyroxine (SYNTHROID) tablet 75 mcg  75 mcg Oral QAC breakfast Rosalin Hawking, MD   75 mcg at 09/17/19 0553  . mirtazapine (REMERON) tablet 7.5 mg  7.5 mg Oral QHS Rosalin Hawking, MD   7.5 mg at 09/16/19 2241  . pantoprazole (PROTONIX) EC tablet 40 mg  40 mg Oral Daily Rosalin Hawking, MD   40 mg at 09/16/19 1327  . pravastatin (PRAVACHOL) tablet 20 mg  20 mg Oral q1800 Rosalin Hawking, MD   20 mg at 09/16/19 1857  . senna-docusate (Senokot-S) tablet 1 tablet  1 tablet Oral QHS PRN Emokpae, Ejiroghene E, MD      . vitamin B-12 (CYANOCOBALAMIN) tablet 1,000 mcg  1,000 mcg Oral Daily Rosalin Hawking, MD   1,000 mcg at 09/16/19 1331     Discharge Medications: Please see discharge summary for a list of discharge medications.  Relevant Imaging Results:  Relevant Lab Results:   Additional Information SSN: [place], [place]

## 2019-09-17 NOTE — Discharge Instructions (Signed)

## 2019-09-17 NOTE — TOC Initial Note (Signed)
Transition of Care [facility]) - Initial/Assessment Note    Patient Details  Name: Phillip Taylor MRN: [id number] Date of Birth: [birth year]  Transition of Care [facility]) CM/SW Contact:    Alexander Mt, [place] Phone Number: 09/17/2019, 10:52 AM  Clinical Narrative:                 CSW aware pt from [facility]. Requiring heavy assistance. PT unsure if this is his baseline. CSW called [facility] and was directed to RN leadership there, no answer, HIPAA compliant message left.   CSW also left HIPAA compliant message for pt son [name]. Aware pt had discharged in June [facility]. CSW and TOC team to continue to follow.   Expected Discharge Plan: Skilled Nursing Facility Barriers to Discharge: Continued Medical Work up   Patient Goals and CMS Choice   CMS Medicare.gov Compare Post Acute Care list provided to:: Patient Represenative (must comment) Choice offered to / list presented to : Adult Children  Expected Discharge Plan and Services Expected Discharge Plan: [place] In-house Referral: Clinical Social Work Discharge Planning Services: CM Consult Living arrangements for the past 2 months: [place]   Prior Living Arrangements/Services Living arrangements for the past 2 months: [place] Lives with:: Facility Resident Patient language and need for interpreter reviewed:: Yes(no needs) Do you feel safe going back to the place where you live?: Yes      Need for Family Participation in Patient Care: Yes (Comment)(decision making; support with daily cares) Care giver support system in place?: Yes (comment)(facility staff; adult son) Current home services: DME Criminal Activity/Legal Involvement Pertinent to Current Situation/Hospitalization: No - Comment as needed   Permission Sought/Granted Permission sought to share information with : Family Supports Permission granted to share information with : No(pt  only oriented to self)  Share Information with NAME: Embert Meserole  Permission granted to share info w AGENCY: [place]/SNFs  Permission granted to share info w Relationship: son  Permission granted to share info w Contact Information: [phone]  Emotional Assessment Appearance:: Other (Comment Required(telephonic assessment) Attitude/Demeanor/Rapport: (telephonic assessment) Affect (typically observed): (telephonic assessment) Orientation: : Oriented to Self, Fluctuating Orientation (Suspected and/or reported Sundowners) Alcohol / Substance Use: Not Applicable Psych Involvement: No (comment)  Admission diagnosis:  Aphasia [R47.01] Speech abnormality [R47.9] Stroke-like symptom [R29.90] Altered mental status, unspecified altered mental status type [R41.82] Patient Active Problem List   Diagnosis Date Noted  . Stroke-like symptom 09/15/2019  . Speech abnormality 09/14/2019  . Acute parotitis 05/06/2019  . CKD (chronic kidney disease) stage 3, GFR 30-59 ml/min 05/05/2019  . Pulmonary embolism ([place]) 05/04/2019  . Atrial fibrillation ([place]) 05/04/2019  . GERD without esophagitis 04/17/2019  . Poor appetite 04/09/2019  . Dysphagia 04/09/2019  . Weight loss 04/07/2019  . Protein-calorie malnutrition, severe ([place]) 03/30/2019  . Elevated liver enzymes 03/29/2019  . Vaso vagal episode 03/29/2019  . Hypokalemia 03/20/2019  . Chronic constipation 03/12/2019  . Carotid artery stenosis 03/12/2019  . Carotid sinus hypersensitivity 03/12/2019  . Hypocalcemia 03/12/2019  . Acute blood loss anemia 03/12/2019  . Dementia without behavioral disturbance ([place]) 03/12/2019  . Intertrochanteric fracture of right femur, closed, initial encounter ([place]) 03/09/2019  . Acute dehydration 03/09/2019  . Osteoporosis 03/09/2019  . Essential hypertension 03/09/2019  . Hypothyroidism 03/09/2019  . Intertrochanteric fracture ([place]) 03/09/2019  . Heme positive stool 10/17/2012   PCP:  [name],  MD Pharmacy:   [facility] [place], [place] -  [place] [id number] [place] [address] Phone: [phone] Fax: [phone]  [place] [address] - [address] [place], [place] [address] Phone: [phone] Fax: [phone]  [place], [place] [address] Phone: [phone] Fax: [phone]   Readmission Risk Interventions Readmission Risk Prevention Plan 05/07/2019 05/06/2019 03/11/2019  Post Dischage Appt - - Not Complete  Appt Comments - - Pt going SNF rehab. SNF MD will follow.  Medication Screening - - Complete  Transportation Screening - Complete Complete  PCP or Specialist Appt within 3-5 Days (No Data) Complete -  HRI or [place] - Complete -  Social Work Consult for [place] Planning/Counseling - Complete -  Palliative Care Screening - Complete -  Medication Review Press photographer) - Complete -  Some recent data might be hidden

## 2019-09-18 LAB — BASIC METABOLIC PANEL
Anion gap: 8 (ref 5–15)
BUN: 16 mg/dL (ref 8–23)
CO2: 21 mmol/L — ABNORMAL LOW (ref 22–32)
Calcium: 7.4 mg/dL — ABNORMAL LOW (ref 8.9–10.3)
Chloride: 107 mmol/L (ref 98–111)
Creatinine, Ser: 0.99 mg/dL (ref 0.61–1.24)
GFR calc Af Amer: >60 mL/min (ref >60)
GFR calc non Af Amer: >60 mL/min (ref >60)
Glucose, Bld: 117 mg/dL — ABNORMAL HIGH (ref 70–99)
Potassium: 3.3 mmol/L — ABNORMAL LOW (ref 3.5–5.1)
Sodium: 136 mmol/L (ref 135–145)

## 2019-09-18 LAB — PHOSPHORUS: Phosphorus: 2 mg/dL — ABNORMAL LOW (ref 2.5–4.6)

## 2019-09-18 MED ORDER — POTASSIUM CHLORIDE CRYS ER 20 MEQ PO TBCR
40.0000 meq | EXTENDED_RELEASE_TABLET | Freq: Once | ORAL | Status: AC
  Administered 2019-09-18: 40 meq via ORAL
  Filled 2019-09-18: qty 2

## 2019-09-18 MED ORDER — CARVEDILOL 3.125 MG PO TABS
3.1250 mg | ORAL_TABLET | Freq: Two times a day (BID) | ORAL | Status: DC
  Administered 2019-09-19: 3.125 mg via ORAL
  Filled 2019-09-18: qty 1

## 2019-09-18 MED ORDER — SODIUM CHLORIDE 0.9 % IV SOLN: INTRAVENOUS | Status: DC

## 2019-09-18 NOTE — Consult Note (Signed)
   [facility] CM Inpatient Consult   09/18/2019  Phillip Taylor Feb 27, 1925 [id number]   Patient screened for high risk score for unplanned readmission score and for 2 hospitalizations in the past 6 months. Also,  to check if potential [place] Management services for transition of care/disposition.    Review of patient's medical record reveals patient is from an assisted living facility resident at [facility].  Patient in the Medicare Next [facility]].  Admitted with Altered Mental Status which includes but not limited to a HX of Dementia per MD notes and admitted with Acute Metabolic Encephalopathy.  Primary Care Provider is  [name], MD with [facility] Internal Medicine   Plan:  Patient's notes from Inpatient Transition of Care [TOC] team indicates patient is to return to [facility] in their Memory Care area with [facility].  No current [facility] Care Management needs are identified for ALF follow up.  For questions contact:   Natividad Brood, RN BSN [place] [facility] Liaison  [phone] business mobile phone Toll free office [phone]  Fax number: [phone] [email or web address]

## 2019-09-18 NOTE — Care Management Important Message (Signed)
Important Message  Patient Details  Name: Phillip Taylor MRN: [id number] Date of Birth: 15-May-1925   Medicare Important Message Given:  Yes     [name] [name] 09/18/2019, 12:53 PM

## 2019-09-18 NOTE — Progress Notes (Signed)
Wheelchair:   Patient suffers from weakness which impairs their ability to perform daily activities like dressing, ambulating in the home. A walker will not resolve  issue with performing activities of daily living. A wheelchair will allow patient to safely perform daily activities. Patient is not able to propel themselves in the home using a standard weight wheelchair due to weakness. Patient can self propel in the lightweight wheelchair. Length of need lifetime.  Accessories: elevating leg rests (ELRs), wheel locks, extensions and anti-tippers

## 2019-09-18 NOTE — Progress Notes (Signed)
PROGRESS NOTE    Phillip Taylor  [id number] DOB: 05-Oct-1924 DOA: 09/14/2019 PCP: [name], MD     Brief Narrative:  Phillip Taylor a 85 y.o.malewith medical history significant fordementia, pulmonary embolism,carotid artery stenosis, atrial fibrillation, CKD 3, BPH, hypertension, vocal cord cancer. Patient was brought to the ED from [place] with reports of altered mental status, patient not communicating and having trouble ambulating. History is obtained from chart review as patient on my evaluation thoughawake and verbalizing is notanswering my questions. Per staff at [facility], patient was last seen normal yesterday at baseline with no deficits, at about 6 AM today patient was off balance but could communicate a little,at lunch he was unable to communicate and could not walk. At baseline patient ambulates with a walker and answers questions.RecentNegative Covid test 10/12.  ED Course:Initially normal temperature, later had a temperature 100.8,heart rate 80s to 90s, blood pressure 120-170s, O2 sats greater than 91% on room air. Chest x-ray-mild coarsening of the interstitium bilaterally favored to represent chronic changes. No active disease. Head CT without acute abnormality. Cerebral perfusion CTA head and neck done-28 mill area of delayed perfusion in the temporal lobes bilaterally fever artifact over ischemia MRI recommended. 50% diameter stenosis proximal right ICA with 4 mm plaque ulceration. Evaluated by telemetry neurologist-not a candidate for TPA with resolution of symptoms, impressionofTIA versus other , recommendedfurther imaging which was done- as above.Hospitalist to admit TIA/stroke."   Neurology was consulted due to patient's difficulty with speech.  Neurology thought that patient had likely encephalopathy in setting of dementia and dehydration.  New events last 24 hours / Subjective: Had fever 101.5 yesterday morning, has remained  afebrile since.  Was hopeful to discharge back to [facility] today, however, blood pressure this afternoon was borderline low 89/52 and repeat 91/59.  Started on IV fluids.  Patient has not been very interactive today, was able to answer some simple questions.  Assessment & Plan:   Active Problems:   Speech abnormality   Stroke-like symptom    Acute metabolicencephalopathyin the setting of dementia and dehydration -Initially admitted for stroke work-up with code stroke, seen by neurology, and felt to be secondary to encephalopathy.  -CT head no acute abnormality -CT head and neck right ICA 50% stenosis with 4 mm plaque ulceration no significant intracranial stenosis or large vessel occlusion -CT perfusion delayed perfusion in bilateral temporal lobes.  Favor artifact over ischemia. -Repeat CT stable. No visible acute or evolving ischemic infarct identified. -EEG background slowing no acute seizure -2D echo EF 60 to 50% -Continues home Eliquis, pravastatin  Hypotension -IVF and monitor VS   Dysphagia -SLP eval, rec dysphagia 3 diet   Hypokalemia -Replaced   Transient fever  -UA and chest x-ray no acute infection noted -Obtain blood culture for temp > 100.4  PAF/history of PE -Continue Coreg and Eliquis  Essential hypertension -Continue Coreg - hold this afternoon's dose   Hyperlipidemia  -Pravastatin  Dementia -Continue remeron -Continue supportive care  Hypothyroidism -Continue Synthroid  History of erosive gastropathy/duodenal ulcer -Continue Protonix   DVT prophylaxis: Eliquis Code Status: DNR Family Communication: Left voicemail for son to call us back Disposition Plan: Plan for discharge to [place] memory care unit when blood pressure stable   Consultants:   Neurology  Antimicrobials:  Anti-infectives (From admission, onward)   Start     Dose/Rate Route Frequency Ordered Stop   09/15/19 1400  vancomycin (VANCOCIN) 500 mg in sodium  chloride 0.9 % 100 mL IVPB  Status:  Discontinued     500 mg 100 mL/hr over 60 Minutes Intravenous Every 12 hours 09/15/19 0914 09/15/19 1328   09/14/19 2345  vancomycin (VANCOCIN) 1,500 mg in sodium chloride 0.9 % 500 mL IVPB     1,500 mg 250 mL/hr over 120 Minutes Intravenous  Once 09/14/19 2301 09/15/19 0410   09/14/19 2315  aztreonam (AZACTAM) 1 g in sodium chloride 0.9 % 100 mL IVPB  Status:  Discontinued     1 g 200 mL/hr over 30 Minutes Intravenous Every 8 hours 09/14/19 2302 09/15/19 1328        Objective: Vitals:   09/18/19 0345 09/18/19 0805 09/18/19 1200 09/18/19 1201  BP: (!) 146/77 (!) 157/77 (!) 89/52 (!) 91/59  Pulse: 68 80 72   Resp: 19 18 16    Temp: 98 F (36.7 C) 100.1 F (37.8 C) 98.6 F (37 C)   TempSrc: Oral Oral Oral   SpO2: 96% 96% 94%   Weight:      Height:        Intake/Output Summary (Last 24 hours) at 09/18/2019 1223 Last data filed at 09/18/2019 0841 Gross per 24 hour  Intake 2235.75 ml  Output -  Net 2235.75 ml   Filed Weights   09/14/19 1319  Weight: 61.1 kg    Examination:  General exam: Appears calm and comfortable  Respiratory system: Clear to auscultation. Respiratory effort normal. No respiratory distress. No conversational dyspnea.  Cardiovascular system: S1 & S2 heard, RRR. No murmurs. No pedal edema. Gastrointestinal system: Abdomen is nondistended, soft and nontender. Normal bowel sounds heard. Central nervous system: Alert Extremities: Symmetric in appearance  Skin: No rashes, lesions or ulcers on exposed skin    Data Reviewed: I have personally reviewed following labs and imaging studies  CBC: Recent Labs  Lab 09/14/19 1415 09/15/19 0439 09/17/19 0340  WBC 5.0 4.5 4.0  NEUTROABS  --   --  2.3  HGB 15.7 13.4 13.2  HCT 47.5 40.8 38.7*  MCV 98.1 98.3 95.3  PLT 148* 120* [id number]*   Basic Metabolic Panel: Recent Labs  Lab 09/14/19 1415 09/15/19 0439 09/17/19 0340 09/17/19 1556 09/18/19 0228  NA 138 138 139 137  136  K 4.3 3.5 2.9* 3.5 3.3*  CL 103 105 109 107 107  CO2 23 23 21* 20* 21*  GLUCOSE 131* 130* 125* 133* 117*  BUN 34* 25* 16 15 16   CREATININE 1.15 0.95 1.10 0.89 0.99  CALCIUM 8.7* 8.0* 7.8* 7.5* 7.4*  MG  --   --  1.7  --   --   PHOS  --   --  1.7*  --  2.0*   GFR: Estimated Creatinine Clearance: 41.1 mL/min (by C-G formula based on SCr of 0.99 mg/dL). Liver Function Tests: Recent Labs  Lab 09/14/19 1415 09/17/19 0340  AST 71*  --   ALT 41  --   ALKPHOS 162*  --   BILITOT 1.3*  --   PROT 7.8  --   ALBUMIN 3.9 2.5*   No results for input(s): LIPASE, AMYLASE in the last 168 hours. No results for input(s): AMMONIA in the last 168 hours. Coagulation Profile: Recent Labs  Lab 09/14/19 1421  INR 1.2   Cardiac Enzymes: No results for input(s): CKTOTAL, CKMB, CKMBINDEX, TROPONINI in the last 168 hours. BNP (last 3 results) No results for input(s): PROBNP in the last 8760 hours. HbA1C: No results for input(s): HGBA1C in the last 72 hours. CBG: Recent Labs  Lab 09/14/19 1358 09/16/19 0024  GLUCAP 129* 112*   Lipid Profile: No results for input(s): CHOL, HDL, LDLCALC, TRIG, CHOLHDL, LDLDIRECT in the last 72 hours. Thyroid Function Tests: No results for input(s): TSH, T4TOTAL, FREET4, T3FREE, THYROIDAB in the last 72 hours. Anemia Panel: No results for input(s): VITAMINB12, FOLATE, FERRITIN, TIBC, IRON, RETICCTPCT in the last 72 hours. Sepsis Labs: Recent Labs  Lab 09/14/19 1431 09/14/19 1636  LATICACIDVEN 2.2* 1.6    Recent Results (from the past 240 hour(s))  Urine culture     Status: Abnormal   Collection Time: 09/14/19  1:37 PM   Specimen: Urine, Clean Catch  Result Value Ref Range Status   Specimen Description   Final    URINE, CLEAN CATCH Performed at [facility], [address] [place], [place] [address]    Special Requests   Final    NONE Performed at [facility], [address], [place], [place] [address]    Culture MULTIPLE SPECIES PRESENT,  SUGGEST RECOLLECTION (A)  Final   Report Status 09/16/2019 FINAL  Final  Blood culture (routine x 2)     Status: None (Preliminary result)   Collection Time: 09/14/19  1:48 PM   Specimen: Right Antecubital; Blood  Result Value Ref Range Status   Specimen Description   Final    RIGHT ANTECUBITAL BOTTLES DRAWN AEROBIC AND ANAEROBIC   Special Requests Blood Culture adequate volume  Final   Culture   Final    NO GROWTH 4 DAYS Performed at [facility], [address] [place], [place] [address]    Report Status PENDING  Incomplete  Blood culture (routine x 2)     Status: None (Preliminary result)   Collection Time: 09/14/19  2:15 PM   Specimen: Right Antecubital; Blood  Result Value Ref Range Status   Specimen Description RIGHT ANTECUBITAL BOTTLES DRAWN AEROBIC ONLY  Final   Special Requests Blood Culture adequate volume  Final   Culture   Final    NO GROWTH 4 DAYS Performed at [facility], [address], [place], [place] [address]    Report Status PENDING  Incomplete  SARS CORONAVIRUS 2 (TAT 6-24 HRS) Nasopharyngeal Nasopharyngeal Swab     Status: None   Collection Time: 09/14/19  4:06 PM   Specimen: Nasopharyngeal Swab  Result Value Ref Range Status   SARS Coronavirus 2 NEGATIVE NEGATIVE Final    Comment: (NOTE) SARS-CoV-2 target nucleic acids are NOT DETECTED. The SARS-CoV-2 RNA is generally detectable in upper and lower respiratory specimens during the acute phase of infection. Negative results do not preclude SARS-CoV-2 infection, do not rule out co-infections with other pathogens, and should not be used as the sole basis for treatment or other patient management decisions. Negative results must be combined with clinical observations, patient history, and epidemiological information. The expected result is Negative. Fact Sheet for Patients: SugarRoll.be Fact Sheet for Healthcare Providers: https://www.woods-mathews.com/ This test is  not yet approved or cleared by the [place] FDA and  has been authorized for detection and/or diagnosis of SARS-CoV-2 by FDA under an Emergency Use Authorization (EUA). This EUA will remain  in effect (meaning this test can be used) for the duration of the COVID-19 declaration under Section 56 4(b)(1) of the Act, 21 U.S.C. section 360bbb-3(b)(1), unless the authorization is terminated or revoked sooner. Performed at [facility], [place] [address], [place], [place] [address]   MRSA PCR Screening     Status: None   Collection Time: 09/15/19  4:13 PM   Specimen: Nasopharyngeal  Result Value Ref Range Status   MRSA by PCR NEGATIVE NEGATIVE Final    Comment:        The GeneXpert MRSA Assay (FDA approved for NASAL specimens only), is one component of a comprehensive MRSA colonization surveillance program. It is not intended to diagnose MRSA infection nor to guide or monitor treatment for MRSA infections. Performed at [facility], [place] [address] [place], [place] [address]   SARS CORONAVIRUS 2 (TAT 6-24 HRS) Nasopharyngeal Nasopharyngeal Swab     Status: None   Collection Time: 09/17/19  5:37 PM   Specimen: Nasopharyngeal Swab  Result Value Ref Range Status   SARS Coronavirus 2 NEGATIVE NEGATIVE Final    Comment: (NOTE) SARS-CoV-2 target nucleic acids are NOT DETECTED. The SARS-CoV-2 RNA is generally detectable in upper and lower respiratory specimens during the acute phase of infection. Negative results do not preclude SARS-CoV-2 infection, do not rule out co-infections with other pathogens, and should not be used as the sole basis for treatment or other patient management decisions. Negative results must be combined with clinical observations, patient history, and epidemiological information. The expected result is Negative. Fact Sheet for Patients: SugarRoll.be Fact Sheet for Healthcare Providers:  https://www.woods-mathews.com/ This test is not yet approved or cleared by the [place] FDA and  has been authorized for detection and/or diagnosis of SARS-CoV-2 by FDA under an Emergency Use Authorization (EUA). This EUA will remain  in effect (meaning this test can be used) for the duration of the COVID-19 declaration under Section 56 4(b)(1) of the Act, 21 U.S.C. section 360bbb-3(b)(1), unless the authorization is terminated or revoked sooner. Performed at [facility], [place] [address], [place], [place] [address]       Radiology Studies: CT HEAD WO CONTRAST  Result Date: 09/17/2019 CLINICAL DATA:  Follow-up examination for acute stroke. EXAM: CT HEAD WITHOUT CONTRAST TECHNIQUE: Contiguous axial images were obtained from the base of the skull through the vertex without intravenous contrast. COMPARISON:  Prior CT from 09/14/2019. FINDINGS: Brain: Atrophy with chronic microvascular ischemic disease again noted, stable. No acute intracranial hemorrhage. No visible acute or evolving large vessel territory infarct. No mass lesion, midline shift or mass effect. Stable ventricular size without hydrocephalus. No extra-axial fluid collection. Vascular: No hyperdense vessel. Calcified atherosclerosis present at skull base. Skull: Scalp soft tissues and calvarium within normal limits. Sinuses/Orbits: Globes orbital soft tissues demonstrate no acute finding. Chronic frontal sinus disease noted. Paranasal sinuses are otherwise clear. No mastoid effusion. Other: None. IMPRESSION: 1. Stable head CT. No visible acute or evolving ischemic infarct identified. 2. Atrophy with chronic microvascular ischemic disease. Electronically Signed   By: Jeannine Boga M.D.   On: 09/17/2019 02:12   EEG adult  Result Date: 09/17/2019 Lora Havens, MD     09/17/2019  9:35 AM Patient Name: RONTE GRAVELINE MRN: [id number] Epilepsy Attending: Lora Havens Referring Physician/Provider: Dr  Rosalin Hawking Date: 09/16/2019 Duration: 28.52 mins Patient history: 85yo M with h/o dementia who presented with speech abnormality, difficulty walking and ams. EEG to evaluate for seizure Level of alertness: awake AEDs during EEG study: None Technical aspects: This EEG study was done with scalp electrodes positioned according to the 10-20 International system of electrode placement. Electrical activity was acquired at a sampling rate of 500Hz  and reviewed with a high frequency filter of 70Hz  and a low frequency filter of 1Hz . EEG data were recorded continuously and digitally stored. DESCRIPTION:  The posterior dominant rhythm consists of 7  Hz activity of moderate voltage (25-35 uV) seen predominantly in posterior head regions, symmetric and reactive to eye opening. EEG showed continuous generalized 3-5hz  theta-delta slowing. Hyperventilation and photic stimulation were not performed. ABNORMALITY - Continuous slow, generalized - Background slow IMPRESSION: This study is suggestive of mild diffuse encephalopathy, non specific to etiology. No seizures or epileptiform discharges were seen throughout the recording. Priyanka Barbra Sarks      Scheduled Meds: .  stroke: mapping our early stages of recovery book   Does not apply Once  . apixaban  5 mg Oral BID  . carvedilol  3.125 mg Oral BID WC  . levothyroxine  75 mcg Oral QAC breakfast  . mirtazapine  7.5 mg Oral QHS  . pantoprazole  40 mg Oral Daily  . pravastatin  20 mg Oral q1800  . vitamin B-12  1,000 mcg Oral Daily   Continuous Infusions: . sodium chloride       LOS: 3 days      Time spent: 40 minutes   Dessa Phi, DO [facility] 09/18/2019, 12:23 PM   Available via Epic secure chat 7am-7pm After these hours, please refer to coverage provider listed on [email or web address]

## 2019-09-18 NOTE — TOC Progression Note (Signed)
Transition of Care [facility]) - Progression Note    Patient Details  Name: NATHANIE DETTLING MRN: [id number] Date of Birth: 08/10/1925  [facility] CM/SW Contact  Pollie Friar, RN Phone Number: 09/18/2019, 1:33 PM  Clinical Narrative:    Wheelchair ordered through [employer] and will be delivered to his ALF.  TOC following.   Expected Discharge Plan: Memory Care Barriers to Discharge: Continued Medical Work up  Expected Discharge Plan and Services Expected Discharge Plan: Memory Care In-house Referral: Clinical Social Work Discharge Planning Services: CM Consult   Living arrangements for the past 2 months: [place]                                       Social Determinants of Health (SDOH) Interventions    Readmission Risk Interventions Readmission Risk Prevention Plan 09/17/2019 05/07/2019 05/06/2019  Post Dischage Appt - - -  Appt Comments - - -  Medication Screening - - -  Transportation Screening Complete - Complete  PCP or Specialist Appt within 3-5 Days Not Complete (No Data) Complete  Not Complete comments facility resident - -  [place] or Home Care Consult Complete - Complete  Social Work Consult for [place] Planning/Counseling Complete - Complete  Palliative Care Screening Not Applicable - Complete  Medication Review [employer]) Complete - Complete  Some recent data might be hidden

## 2019-09-18 NOTE — Discharge Summary (Addendum)
Physician Discharge Summary  Phillip Taylor [id number] DOB: 02-28-1925 DOA: 09/14/2019  PCP: [name], MD  Admit date: 09/14/2019 Discharge date: 09/19/2019  Admitted From: ALF Disposition:  [facility]   Recommendations for Outpatient Follow-up:  1. Follow up with PCP in 1 week  Discharge Condition: Stable CODE STATUS: DNR  Diet recommendation: Dysphagia 3  Brief/Interim Summary: From H&P by Dr. Denton Brick: "Phillip Taylor is a 85 y.o. male with medical history significant for dementia, pulmonary embolism, carotid artery stenosis, atrial fibrillation, CKD 3, BPH, hypertension, vocal cord cancer.  Patient was brought to the ED from [facility] with reports of altered mental status, patient not communicating and having trouble ambulating.  History is obtained from chart review as patient on my evaluation though awake and verbalizing is not answering my questions.  Per staff at [facility], patient was last seen normal yesterday at baseline with no deficits, at about 6 AM today patient was off balance but could communicate a little, at lunch he was unable to communicate and could not walk. At baseline patient ambulates with a walker and answers questions. Recent Negative Covid test 10/12.  ED Course: Initially normal temperature, later had a temperature 100.8, heart rate 80s to 90s, blood pressure 120-170s, O2 sats greater than 91% on room air.  Chest x-ray-mild coarsening of the interstitium bilaterally favored to represent chronic changes.  No active disease.  Head CT without acute abnormality.  Cerebral perfusion CTA head and neck done-28 mill area of delayed perfusion in the temporal lobes bilaterally fever artifact over ischemia MRI recommended.  50% diameter stenosis proximal right ICA with 4 mm plaque ulceration. Evaluated by telemetry neurologist-not a candidate for TPA with resolution of symptoms, impression of TIA versus other, recommended further imaging  which was done- as above.  Hospitalist to admit TIA/stroke."  Neurology was consulted due to patient's difficulty with speech.  Neurology thought that patient had likely encephalopathy in setting of dementia and dehydration.  Discharge Diagnoses:  Active Problems:   Speech abnormality   Stroke-like symptom   Acute metabolic encephalopathy in the setting of dementia and dehydration -Initially admitted for stroke work-up with code stroke, seen by neurology, and felt to be secondary to encephalopathy.  -Repeat CT head no acute finding -CT head and neck right ICA 50% stenosis with 4 mm plaque ulceration -CT perfusion delayed perfusion in B temporal lobes -Repeat CT head no acute finding -EEG background slowing no acute seizure -2D echo EF 60 to 50% -Continues home Eliquis, pravastatin  Dysphagia -SLP eval, rec dysphagia 3 diet   Hypokalemia -Replaced   Hypomagnesemia -Replaced   Transient fever  -UA and chest x-ray no acute infection noted -Blood culture negative  -Afebrile last 24 hours   PAF/history of PE -Continue Coreg and Eliquis   Essential hypertension -Continue Coreg  Hyperlipidemia  -Pravastatin  Dementia -Continue supportive care  Hypothyroidism -Continue home Synthroid  History of erosive gastropathy/duodenal ulcer -Continue Protonix    Discharge Instructions  Discharge Instructions    DIET DYS 3   Complete by: As directed    Fluid consistency: Thin   Discharge instructions   Complete by: As directed    You were cared for by a hospitalist during your hospital stay. If you have any questions about your discharge medications or the care you received while you were in the hospital after you are discharged, you can call the unit and ask to speak with the hospitalist on call if the hospitalist that  took care of you is not available. Once you are discharged, your primary care physician will handle any further medical issues. Please note that NO  REFILLS for any discharge medications will be authorized once you are discharged, as it is imperative that you return to your primary care physician (or establish a relationship with a primary care physician if you do not have one) for your aftercare needs so that they can reassess your need for medications and monitor your lab values.   Increase activity slowly   Complete by: As directed      Allergies as of 09/19/2019      Reactions   Prevacid [lansoprazole] Other (See Comments)   Does not remeber   Prilosec [omeprazole] Other (See Comments)   Does not remeber   Codeine Other (See Comments)   Does not know   Penicillins Rash   .Did it involve swelling of the face/tongue/throat, SOB, or low BP? No Did it involve sudden or severe rash/hives, skin peeling, or any reaction on the inside of your mouth or nose? Yes Did you need to seek medical attention at a hospital or doctor's office? Unknown When did it last happen?Unknown If all above answers are "NO", may proceed with cephalosporin use.      Medication List    STOP taking these medications   clindamycin 300 MG capsule Commonly known as: CLEOCIN   fluconazole 100 MG tablet Commonly known as: Diflucan   levofloxacin 500 MG tablet Commonly known as: LEVAQUIN   NON FORMULARY     TAKE these medications   apixaban 5 MG Tabs tablet Commonly known as: ELIQUIS Take 1 tablet (5 mg total) by mouth 2 (two) times daily. What changed:   how much to take  additional instructions   calcium carbonate 750 MG chewable tablet Commonly known as: TUMS EX Chew 1 tablet by mouth 2 (two) times daily.   carvedilol 3.125 MG tablet Commonly known as: COREG Take 3.125 mg by mouth 2 (two) times daily with a meal.   ENSURE ENLIVE PO Take 1 Bottle by mouth daily.   feeding supplement (PRO-STAT SUGAR FREE 64) Liqd Take 30 mLs by mouth 2 (two) times daily between meals.   levothyroxine 75 MCG tablet Commonly known as:  SYNTHROID Take 75 mcg by mouth daily before breakfast.   mirtazapine 7.5 MG tablet Commonly known as: REMERON Take 7.5 mg by mouth at bedtime.   pantoprazole 40 MG tablet Commonly known as: PROTONIX Take 40 mg by mouth daily.   potassium chloride SA 20 MEQ tablet Commonly known as: KLOR-CON Take 20 mEq by mouth daily.   pravastatin 20 MG tablet Commonly known as: PRAVACHOL Take 1 tablet (20 mg total) by mouth daily at 6 PM.   vitamin B-12 1000 MCG tablet Commonly known as: CYANOCOBALAMIN Take 1,000 mcg by mouth daily.            Durable Medical Equipment  (From admission, onward)         Start     Ordered   09/18/19 0819  For home use only DME wheelchair cushion (seat and back)  Once     09/18/19 0818   09/18/19 0817  For home use only DME lightweight manual wheelchair with seat cushion  Once    Comments: Patient suffers from weakness which impairs their ability to perform daily activities like dressing, ambulating in the home.  A walker will not resolve  issue with performing activities of daily living. A wheelchair will allow patient  to safely perform daily activities. Patient is not able to propel themselves in the home using a standard weight wheelchair due to weakness. Patient can self propel in the lightweight wheelchair. Length of need lifetime. Accessories: elevating leg rests (ELRs), wheel locks, extensions and anti-tippers.   09/18/19 0818   09/17/19 1725  For home use only DME Wheelchair electric  Once    Comments: WC dme for home   09/17/19 1724   09/16/19 1442  For home use only DME 3 n 1  Once     09/16/19 1441          Allergies  Allergen Reactions  . Prevacid [Lansoprazole] Other (See Comments)    Does not remeber  . Prilosec [Omeprazole] Other (See Comments)    Does not remeber  . Codeine Other (See Comments)    Does not know   . Penicillins Rash    .Did it involve swelling of the face/tongue/throat, SOB, or low BP? No Did it involve  sudden or severe rash/hives, skin peeling, or any reaction on the inside of your mouth or nose? Yes Did you need to seek medical attention at a hospital or doctor's office? Unknown When did it last happen?Unknown If all above answers are "NO", may proceed with cephalosporin use.      Consultations:  Neurology    Procedures/Studies: CT Angio Head W or Wo Contrast  Result Date: 09/14/2019 CLINICAL DATA:  Altered mental status.  Rule out stroke. EXAM: CT ANGIOGRAPHY HEAD AND NECK CT PERFUSION BRAIN TECHNIQUE: Multidetector CT imaging of the head and neck was performed using the standard protocol during bolus administration of intravenous contrast. Multiplanar CT image reconstructions and MIPs were obtained to evaluate the vascular anatomy. Carotid stenosis measurements (when applicable) are obtained utilizing NASCET criteria, using the distal internal carotid diameter as the denominator. Multiphase CT imaging of the brain was performed following IV bolus contrast injection. Subsequent parametric perfusion maps were calculated using RAPID software. CONTRAST:  142mL OMNIPAQUE IOHEXOL 350 MG/ML SOLN COMPARISON:  CT head 09/14/2019 FINDINGS: CTA NECK FINDINGS Aortic arch: Incomplete evaluation of the arch. Atherosclerotic disease in the aortic arch. Origins of the innominate and left carotid artery are not imaged. Left subclavian artery is patent proximally. Right carotid system: Right common carotid artery patent. Atherosclerotic disease in the proximal right internal carotid artery. Ulcerated plaque. Ulcer measures approximately 4 mm in diameter and projects posteriorly. No thrombus in the ulcer. Approximately 50% diameter stenosis proximal right internal carotid artery. Left carotid system: Left common carotid artery widely patent. Atherosclerotic disease left carotid bifurcation without significant stenosis. Vertebral arteries: Left vertebral artery dominant. Both vertebral arteries patent to the  basilar. Skeleton: Cervical spondylosis.  No acute skeletal abnormality. Other neck: Negative for mass or adenopathy in the neck. Upper chest: Lung apices clear bilaterally. Review of the MIP images confirms the above findings CTA HEAD FINDINGS Anterior circulation: Atherosclerotic calcification in the cavernous carotid bilaterally without significant stenosis. Anterior and middle cerebral arteries patent bilaterally. Tortuosity of these vessels especially the anterior cerebral arteries. Posterior circulation: Both vertebral arteries patent to the basilar. PICA patent bilaterally. Basilar widely patent. AICA, superior cerebellar, posterior cerebral arteries patent bilaterally without stenosis or occlusion Venous sinuses: Normal venous enhancement Anatomic variants: None Review of the MIP images confirms the above findings CT Brain Perfusion Findings: ASPECTS: 10 CBF (<30%) Volume: 73mL Perfusion (Tmax>6.0s) volume: 46mL Mismatch Volume: 52mL Infarction Location:Delayed perfusion is noted in the left temporal lobe and right frontotemporal lobe. This could be due  to ischemia however the pattern is somewhat atypical and this could be artifact. IMPRESSION: 1. 28 ml of delayed perfusion in the temporal lobes bilaterally. Favor artifact over ischemia. Recommend MRI to evaluate for infarct. 2. 50% diameter stenosis proximal right internal carotid artery with 4 mm plaque ulceration. No thrombus in the vessel. 3. Atherosclerotic disease left carotid bifurcation without significant stenosis 4. No significant vertebral artery stenosis 5. No significant intracranial stenosis or large vessel occlusion. Electronically Signed   By: Franchot Gallo M.D.   On: 09/14/2019 15:49   DG Chest 1 View  Result Date: 09/14/2019 CLINICAL DATA:  Aphasia EXAM: CHEST  1 VIEW COMPARISON:  Chest radiograph 05/04/2019 FINDINGS: Stable cardiomediastinal contours. Left chest ICD remains in place. There is mild coarsening of the interstitium  bilaterally likely representing chronic changes. No focal infiltrate. No pneumothorax or large pleural effusion. No acute finding in the visualized skeleton. IMPRESSION: Mild coarsening of the interstitium bilaterally favored represent chronic changes. No evidence of active disease. Electronically Signed   By: Audie Pinto M.D.   On: 09/14/2019 15:50   CT HEAD WO CONTRAST  Result Date: 09/17/2019 CLINICAL DATA:  Follow-up examination for acute stroke. EXAM: CT HEAD WITHOUT CONTRAST TECHNIQUE: Contiguous axial images were obtained from the base of the skull through the vertex without intravenous contrast. COMPARISON:  Prior CT from 09/14/2019. FINDINGS: Brain: Atrophy with chronic microvascular ischemic disease again noted, stable. No acute intracranial hemorrhage. No visible acute or evolving large vessel territory infarct. No mass lesion, midline shift or mass effect. Stable ventricular size without hydrocephalus. No extra-axial fluid collection. Vascular: No hyperdense vessel. Calcified atherosclerosis present at skull base. Skull: Scalp soft tissues and calvarium within normal limits. Sinuses/Orbits: Globes orbital soft tissues demonstrate no acute finding. Chronic frontal sinus disease noted. Paranasal sinuses are otherwise clear. No mastoid effusion. Other: None. IMPRESSION: 1. Stable head CT. No visible acute or evolving ischemic infarct identified. 2. Atrophy with chronic microvascular ischemic disease. Electronically Signed   By: Jeannine Boga M.D.   On: 09/17/2019 02:12   CT Angio Neck W and/or Wo Contrast  Result Date: 09/14/2019 CLINICAL DATA:  Altered mental status.  Rule out stroke. EXAM: CT ANGIOGRAPHY HEAD AND NECK CT PERFUSION BRAIN TECHNIQUE: Multidetector CT imaging of the head and neck was performed using the standard protocol during bolus administration of intravenous contrast. Multiplanar CT image reconstructions and MIPs were obtained to evaluate the vascular anatomy.  Carotid stenosis measurements (when applicable) are obtained utilizing NASCET criteria, using the distal internal carotid diameter as the denominator. Multiphase CT imaging of the brain was performed following IV bolus contrast injection. Subsequent parametric perfusion maps were calculated using RAPID software. CONTRAST:  174mL OMNIPAQUE IOHEXOL 350 MG/ML SOLN COMPARISON:  CT head 09/14/2019 FINDINGS: CTA NECK FINDINGS Aortic arch: Incomplete evaluation of the arch. Atherosclerotic disease in the aortic arch. Origins of the innominate and left carotid artery are not imaged. Left subclavian artery is patent proximally. Right carotid system: Right common carotid artery patent. Atherosclerotic disease in the proximal right internal carotid artery. Ulcerated plaque. Ulcer measures approximately 4 mm in diameter and projects posteriorly. No thrombus in the ulcer. Approximately 50% diameter stenosis proximal right internal carotid artery. Left carotid system: Left common carotid artery widely patent. Atherosclerotic disease left carotid bifurcation without significant stenosis. Vertebral arteries: Left vertebral artery dominant. Both vertebral arteries patent to the basilar. Skeleton: Cervical spondylosis.  No acute skeletal abnormality. Other neck: Negative for mass or adenopathy in the neck. Upper chest: Lung apices  clear bilaterally. Review of the MIP images confirms the above findings CTA HEAD FINDINGS Anterior circulation: Atherosclerotic calcification in the cavernous carotid bilaterally without significant stenosis. Anterior and middle cerebral arteries patent bilaterally. Tortuosity of these vessels especially the anterior cerebral arteries. Posterior circulation: Both vertebral arteries patent to the basilar. PICA patent bilaterally. Basilar widely patent. AICA, superior cerebellar, posterior cerebral arteries patent bilaterally without stenosis or occlusion Venous sinuses: Normal venous enhancement Anatomic  variants: None Review of the MIP images confirms the above findings CT Brain Perfusion Findings: ASPECTS: 10 CBF (<30%) Volume: 59mL Perfusion (Tmax>6.0s) volume: 79mL Mismatch Volume: 104mL Infarction Location:Delayed perfusion is noted in the left temporal lobe and right frontotemporal lobe. This could be due to ischemia however the pattern is somewhat atypical and this could be artifact. IMPRESSION: 1. 28 ml of delayed perfusion in the temporal lobes bilaterally. Favor artifact over ischemia. Recommend MRI to evaluate for infarct. 2. 50% diameter stenosis proximal right internal carotid artery with 4 mm plaque ulceration. No thrombus in the vessel. 3. Atherosclerotic disease left carotid bifurcation without significant stenosis 4. No significant vertebral artery stenosis 5. No significant intracranial stenosis or large vessel occlusion. Electronically Signed   By: Franchot Gallo M.D.   On: 09/14/2019 15:49   CT CEREBRAL PERFUSION W CONTRAST  Result Date: 09/14/2019 CLINICAL DATA:  Altered mental status.  Rule out stroke. EXAM: CT ANGIOGRAPHY HEAD AND NECK CT PERFUSION BRAIN TECHNIQUE: Multidetector CT imaging of the head and neck was performed using the standard protocol during bolus administration of intravenous contrast. Multiplanar CT image reconstructions and MIPs were obtained to evaluate the vascular anatomy. Carotid stenosis measurements (when applicable) are obtained utilizing NASCET criteria, using the distal internal carotid diameter as the denominator. Multiphase CT imaging of the brain was performed following IV bolus contrast injection. Subsequent parametric perfusion maps were calculated using RAPID software. CONTRAST:  153mL OMNIPAQUE IOHEXOL 350 MG/ML SOLN COMPARISON:  CT head 09/14/2019 FINDINGS: CTA NECK FINDINGS Aortic arch: Incomplete evaluation of the arch. Atherosclerotic disease in the aortic arch. Origins of the innominate and left carotid artery are not imaged. Left subclavian artery  is patent proximally. Right carotid system: Right common carotid artery patent. Atherosclerotic disease in the proximal right internal carotid artery. Ulcerated plaque. Ulcer measures approximately 4 mm in diameter and projects posteriorly. No thrombus in the ulcer. Approximately 50% diameter stenosis proximal right internal carotid artery. Left carotid system: Left common carotid artery widely patent. Atherosclerotic disease left carotid bifurcation without significant stenosis. Vertebral arteries: Left vertebral artery dominant. Both vertebral arteries patent to the basilar. Skeleton: Cervical spondylosis.  No acute skeletal abnormality. Other neck: Negative for mass or adenopathy in the neck. Upper chest: Lung apices clear bilaterally. Review of the MIP images confirms the above findings CTA HEAD FINDINGS Anterior circulation: Atherosclerotic calcification in the cavernous carotid bilaterally without significant stenosis. Anterior and middle cerebral arteries patent bilaterally. Tortuosity of these vessels especially the anterior cerebral arteries. Posterior circulation: Both vertebral arteries patent to the basilar. PICA patent bilaterally. Basilar widely patent. AICA, superior cerebellar, posterior cerebral arteries patent bilaterally without stenosis or occlusion Venous sinuses: Normal venous enhancement Anatomic variants: None Review of the MIP images confirms the above findings CT Brain Perfusion Findings: ASPECTS: 10 CBF (<30%) Volume: 27mL Perfusion (Tmax>6.0s) volume: 73mL Mismatch Volume: 46mL Infarction Location:Delayed perfusion is noted in the left temporal lobe and right frontotemporal lobe. This could be due to ischemia however the pattern is somewhat atypical and this could be artifact. IMPRESSION: 1. 28 ml  of delayed perfusion in the temporal lobes bilaterally. Favor artifact over ischemia. Recommend MRI to evaluate for infarct. 2. 50% diameter stenosis proximal right internal carotid artery with 4  mm plaque ulceration. No thrombus in the vessel. 3. Atherosclerotic disease left carotid bifurcation without significant stenosis 4. No significant vertebral artery stenosis 5. No significant intracranial stenosis or large vessel occlusion. Electronically Signed   By: Franchot Gallo M.D.   On: 09/14/2019 15:49   EEG adult  Result Date: 09/17/2019 Lora Havens, MD     09/17/2019  9:35 AM Patient Name: OSEPH BREAKIRON MRN: [id number] Epilepsy Attending: Lora Havens Referring Physician/Provider: Dr Rosalin Hawking Date: 09/16/2019 Duration: 28.52 mins Patient history: 85yo M with h/o dementia who presented with speech abnormality, difficulty walking and ams. EEG to evaluate for seizure Level of alertness: awake AEDs during EEG study: None Technical aspects: This EEG study was done with scalp electrodes positioned according to the 10-20 International system of electrode placement. Electrical activity was acquired at a sampling rate of 500Hz  and reviewed with a high frequency filter of 70Hz  and a low frequency filter of 1Hz . EEG data were recorded continuously and digitally stored. DESCRIPTION:  The posterior dominant rhythm consists of 7 Hz activity of moderate voltage (25-35 uV) seen predominantly in posterior head regions, symmetric and reactive to eye opening. EEG showed continuous generalized 3-5hz  theta-delta slowing. Hyperventilation and photic stimulation were not performed. ABNORMALITY - Continuous slow, generalized - Background slow IMPRESSION: This study is suggestive of mild diffuse encephalopathy, non specific to etiology. No seizures or epileptiform discharges were seen throughout the recording. Lora Havens   ECHOCARDIOGRAM COMPLETE  Result Date: 09/15/2019   ECHOCARDIOGRAM REPORT   Patient Name:   Tracen [place] Date of Exam: 09/15/2019 Medical Rec #:  [id number]    Height:       72.0 in Accession #:    [id number]   Weight:       134.7 lb Date of Birth:  Aug 26, 1925     BSA:          1.80 m  Patient Age:    48 years     BP:           160/76 mmHg Patient Gender: M            HR:           85 bpm. Exam Location:  [facility] Procedure: 2D Echo Indications:    Stroke 434.91 / I163.9  History:        Patient has prior history of Echocardiogram examinations, most                 recent 05/05/2019. Arrythmias:Atrial Fibrillation; Risk                 Factors:Non-Smoker and Hypertension. Pulmonary embolism ,.  Sonographer:    Leavy Cella RDCS (AE) Referring Phys: [place]  1. Left ventricular ejection fraction, by visual estimation, is 60 to 65%. The left ventricle has normal function. Left ventricular septal wall thickness was severely increased. There is mildly increased left ventricular hypertrophy.  2. Left ventricular diastolic function could not be evaluated.  3. The left ventricle has no regional wall motion abnormalities.  4. Global right ventricle has normal systolic function.The right ventricular size is normal. No increase in right ventricular wall thickness.  5. Left atrial size was normal.  6. Right atrial size was normal.  7. The mitral valve is grossly normal.  Trivial mitral valve regurgitation.  8. The tricuspid valve is not well visualized. Tricuspid valve regurgitation is trivial.  9. The aortic valve is tricuspid. Aortic valve regurgitation is trivial. Mild aortic valve sclerosis without stenosis. 10. The pulmonic valve was grossly normal. Pulmonic valve regurgitation is not visualized. 11. The inferior vena cava is normal in size with greater than 50% respiratory variability, suggesting right atrial pressure of 3 mmHg. 12. The interatrial septum was not assessed. FINDINGS  Left Ventricle: Left ventricular ejection fraction, by visual estimation, is 60 to 65%. The left ventricle has normal function. The left ventricle has no regional wall motion abnormalities. There is mildly increased left ventricular hypertrophy. The left ventricular diastology could not be  evaluated due to atrial fibrillation. Left ventricular diastolic function could not be evaluated. Right Ventricle: The right ventricular size is normal. No increase in right ventricular wall thickness. Global RV systolic function is has normal systolic function. Left Atrium: Left atrial size was normal in size. Right Atrium: Right atrial size was normal in size Pericardium: There is no evidence of pericardial effusion. Mitral Valve: The mitral valve is grossly normal. Trivial mitral valve regurgitation. Tricuspid Valve: The tricuspid valve is not well visualized. Tricuspid valve regurgitation is trivial. Aortic Valve: The aortic valve is tricuspid. Aortic valve regurgitation is trivial. Mild aortic valve sclerosis is present, with no evidence of aortic valve stenosis. Pulmonic Valve: The pulmonic valve was grossly normal. Pulmonic valve regurgitation is not visualized. Pulmonic regurgitation is not visualized. Aorta: The aortic root and ascending aorta are structurally normal, with no evidence of dilitation. Venous: The inferior vena cava is normal in size with greater than 50% respiratory variability, suggesting right atrial pressure of 3 mmHg. IAS/Shunts: The interatrial septum was not assessed.  LEFT VENTRICLE PLAX 2D LVIDd:         2.51 cm  Diastology LVIDs:         1.94 cm  LV e' lateral:   4.79 cm/s LV PW:         1.13 cm  LV E/e' lateral: 25.5 LV IVS:        1.37 cm  LV e' medial:    4.68 cm/s LVOT diam:     2.20 cm  LV E/e' medial:  26.1 LV SV:         11 ml LV SV Index:   6.16 LVOT Area:     3.80 cm  RIGHT VENTRICLE RV S prime:     17.10 cm/s TAPSE (M-mode): 2.3 cm LEFT ATRIUM             Index       RIGHT ATRIUM           Index LA diam:        3.00 cm 1.67 cm/m  RA Area:     11.30 cm LA Vol (A2C):   33.3 ml 18.49 ml/m RA Volume:   29.90 ml  16.60 ml/m LA Vol (A4C):   48.9 ml 27.15 ml/m LA Biplane Vol: 43.5 ml 24.15 ml/m   AORTA Ao Root diam: 2.90 cm MITRAL VALVE MV Area (PHT): 6.39 cm              SHUNTS MV PHT:        34.41 msec           Systemic Diam: 2.20 cm MV Decel Time: 119 msec MV E velocity: 122.00 cm/s 103 cm/s  Lyman Bishop MD Electronically signed by Lyman Bishop MD Signature Date/Time: 09/15/2019/11:33:28 AM  Final    CT HEAD CODE STROKE WO CONTRAST`  Result Date: 09/14/2019 CLINICAL DATA:  Code stroke.  Altered mental status EXAM: CT HEAD WITHOUT CONTRAST TECHNIQUE: Contiguous axial images were obtained from the base of the skull through the vertex without intravenous contrast. COMPARISON:  CT head 05/04/2019 FINDINGS: Brain: Moderate atrophy and moderate chronic microvascular ischemic changes in the white matter are stable. No acute infarct, hemorrhage, mass. No fluid collection or midline shift. Vascular: Negative for hyperdense vessel Skull: Negative Sinuses/Orbits: Mild mucosal edema in the frontal sinus. Remaining sinuses clear. Negative orbit. Other: None ASPECTS ([place] Stroke Program Early CT Score) - Ganglionic level infarction (caudate, lentiform nuclei, internal capsule, insula, M1-M3 cortex): 7 - Supraganglionic infarction (M4-M6 cortex): 3 Total score (0-10 with 10 being normal): 10 IMPRESSION: 1. No acute abnormality 2. ASPECTS is 10 3. Atrophy and chronic microvascular ischemic changes stable from the prior study. 4. These results were called by telephone at the time of interpretation on 09/14/2019 at 3:01 pm to provider Rancour MD, who verbally acknowledged these results. Electronically Signed   By: Franchot Gallo M.D.   On: 09/14/2019 15:01       Discharge Exam: Vitals:   09/19/19 0418 09/19/19 0851  BP: (!) 152/99 (!) 152/77  Pulse: 80 77  Resp: 18   Temp:    SpO2: 96%     General: Pt is alert, awake, not in acute distress, hard of hearing  Cardiovascular: RRR, S1/S2 +, no edema Respiratory: CTA bilaterally, no wheezing, no rhonchi, no respiratory distress, no conversational dyspnea  Abdominal: Soft, NT, ND, bowel sounds + Extremities: no edema, no  cyanosis Psych: Stable     The results of significant diagnostics from this hospitalization (including imaging, microbiology, ancillary and laboratory) are listed below for reference.     Microbiology: Recent Results (from the past 240 hour(s))  Urine culture     Status: Abnormal   Collection Time: 09/14/19  1:37 PM   Specimen: Urine, Clean Catch  Result Value Ref Range Status   Specimen Description   Final    URINE, CLEAN CATCH Performed at [facility], [address], [place], [place] [address]    Special Requests   Final    NONE Performed at [facility], [address], [place], [place] [address]    Culture MULTIPLE SPECIES PRESENT, SUGGEST RECOLLECTION (A)  Final   Report Status 09/16/2019 FINAL  Final  Blood culture (routine x 2)     Status: None   Collection Time: 09/14/19  1:48 PM   Specimen: Right Antecubital; Blood  Result Value Ref Range Status   Specimen Description   Final    RIGHT ANTECUBITAL BOTTLES DRAWN AEROBIC AND ANAEROBIC   Special Requests Blood Culture adequate volume  Final   Culture   Final    NO GROWTH 5 DAYS Performed at [facility], [address], [place], [place] [address]    Report Status 09/19/2019 FINAL  Final  Blood culture (routine x 2)     Status: None   Collection Time: 09/14/19  2:15 PM   Specimen: Right Antecubital; Blood  Result Value Ref Range Status   Specimen Description RIGHT ANTECUBITAL BOTTLES DRAWN AEROBIC ONLY  Final   Special Requests Blood Culture adequate volume  Final   Culture   Final    NO GROWTH 5 DAYS Performed at [facility], [address] [place],  [address]    Report Status 09/19/2019 FINAL  Final  SARS CORONAVIRUS 2 (TAT 6-24 HRS)  Nasopharyngeal Nasopharyngeal Swab     Status: None   Collection Time: 09/14/19  4:06 PM   Specimen: Nasopharyngeal Swab  Result Value Ref Range Status   SARS Coronavirus 2 NEGATIVE NEGATIVE Final    Comment: (NOTE) SARS-CoV-2 target nucleic acids are NOT DETECTED. The  SARS-CoV-2 RNA is generally detectable in upper and lower respiratory specimens during the acute phase of infection. Negative results do not preclude SARS-CoV-2 infection, do not rule out co-infections with other pathogens, and should not be used as the sole basis for treatment or other patient management decisions. Negative results must be combined with clinical observations, patient history, and epidemiological information. The expected result is Negative. Fact Sheet for Patients: SugarRoll.be Fact Sheet for Healthcare Providers: https://www.woods-mathews.com/ This test is not yet approved or cleared by the [place] FDA and  has been authorized for detection and/or diagnosis of SARS-CoV-2 by FDA under an Emergency Use Authorization (EUA). This EUA will remain  in effect (meaning this test can be used) for the duration of the COVID-19 declaration under Section 56 4(b)(1) of the Act, 21 U.S.C. section 360bbb-3(b)(1), unless the authorization is terminated or revoked sooner. Performed at [facility], [place] [address], [place],  [address]   MRSA PCR Screening     Status: None   Collection Time: 09/15/19  4:13 PM   Specimen: Nasopharyngeal  Result Value Ref Range Status   MRSA by PCR NEGATIVE NEGATIVE Final    Comment:        The GeneXpert MRSA Assay (FDA approved for NASAL specimens only), is one component of a comprehensive MRSA colonization surveillance program. It is not intended to diagnose MRSA infection nor to guide or monitor treatment for MRSA infections. Performed at [facility], [place] [address], [place], [place] [address]   SARS CORONAVIRUS 2 (TAT 6-24 HRS) Nasopharyngeal Nasopharyngeal Swab     Status: None   Collection Time: 09/17/19  5:37 PM   Specimen: Nasopharyngeal Swab  Result Value Ref Range Status   SARS Coronavirus 2 NEGATIVE NEGATIVE Final    Comment: (NOTE) SARS-CoV-2 target nucleic acids  are NOT DETECTED. The SARS-CoV-2 RNA is generally detectable in upper and lower respiratory specimens during the acute phase of infection. Negative results do not preclude SARS-CoV-2 infection, do not rule out co-infections with other pathogens, and should not be used as the sole basis for treatment or other patient management decisions. Negative results must be combined with clinical observations, patient history, and epidemiological information. The expected result is Negative. Fact Sheet for Patients: SugarRoll.be Fact Sheet for Healthcare Providers: https://www.woods-mathews.com/ This test is not yet approved or cleared by the [place] FDA and  has been authorized for detection and/or diagnosis of SARS-CoV-2 by FDA under an Emergency Use Authorization (EUA). This EUA will remain  in effect (meaning this test can be used) for the duration of the COVID-19 declaration under Section 56 4(b)(1) of the Act, 21 U.S.C. section 360bbb-3(b)(1), unless the authorization is terminated or revoked sooner. Performed at [facility], [place] [address] [place],  [address]      Labs: BNP (last 3 results) Recent Labs    03/15/19 0840  BNP [id number]*   Basic Metabolic Panel: Recent Labs  Lab 09/15/19 0439 09/17/19 0340 09/17/19 1556 09/18/19 0228 09/19/19 0252  NA 138 139 137 136 138  K 3.5 2.9* 3.5 3.3* 3.3*  CL 105 109 107 107 109  CO2 23 21* 20* 21* 20*  GLUCOSE 130* 125* 133*  117* 105*  BUN 25* 16 15 16 16   CREATININE 0.95 1.10 0.89 0.99 0.91  CALCIUM 8.0* 7.8* 7.5* 7.4* 7.7*  MG  --  1.7  --   --  1.6*  PHOS  --  1.7*  --  2.0*  --    Liver Function Tests: Recent Labs  Lab 09/14/19 1415 09/17/19 0340  AST 71*  --   ALT 41  --   ALKPHOS 162*  --   BILITOT 1.3*  --   PROT 7.8  --   ALBUMIN 3.9 2.5*   No results for input(s): LIPASE, AMYLASE in the last 168 hours. No results for input(s): AMMONIA in the last 168  hours. CBC: Recent Labs  Lab 09/14/19 1415 09/15/19 0439 09/17/19 0340  WBC 5.0 4.5 4.0  NEUTROABS  --   --  2.3  HGB 15.7 13.4 13.2  HCT 47.5 40.8 38.7*  MCV 98.1 98.3 95.3  PLT 148* 120* 107*   Cardiac Enzymes: No results for input(s): CKTOTAL, CKMB, CKMBINDEX, TROPONINI in the last 168 hours. BNP: Invalid input(s): POCBNP CBG: Recent Labs  Lab 09/14/19 1358 09/16/19 0024  GLUCAP 129* 112*   D-Dimer No results for input(s): DDIMER in the last 72 hours. Hgb A1c No results for input(s): HGBA1C in the last 72 hours. Lipid Profile No results for input(s): CHOL, HDL, LDLCALC, TRIG, CHOLHDL, LDLDIRECT in the last 72 hours. Thyroid function studies No results for input(s): TSH, T4TOTAL, T3FREE, THYROIDAB in the last 72 hours.  Invalid input(s): FREET3 Anemia work up No results for input(s): VITAMINB12, FOLATE, FERRITIN, TIBC, IRON, RETICCTPCT in the last 72 hours. Urinalysis    Component Value Date/Time   COLORURINE YELLOW 09/14/2019 1336   APPEARANCEUR CLEAR 09/14/2019 1336   LABSPEC >1.046 (H) 09/14/2019 1336   PHURINE 5.0 09/14/2019 1336   GLUCOSEU NEGATIVE 09/14/2019 1336   HGBUR SMALL (A) 09/14/2019 1336   BILIRUBINUR NEGATIVE 09/14/2019 1336   KETONESUR 5 (A) 09/14/2019 1336   PROTEINUR NEGATIVE 09/14/2019 1336   NITRITE NEGATIVE 09/14/2019 1336   LEUKOCYTESUR NEGATIVE 09/14/2019 1336   Sepsis Labs Invalid input(s): PROCALCITONIN,  WBC,  LACTICIDVEN Microbiology Recent Results (from the past 240 hour(s))  Urine culture     Status: Abnormal   Collection Time: 09/14/19  1:37 PM   Specimen: Urine, Clean Catch  Result Value Ref Range Status   Specimen Description   Final    URINE, CLEAN CATCH Performed at [facility], [address], [place], [place] [address]    Special Requests   Final    NONE Performed at [facility], [address] [place], [place] [address]    Culture MULTIPLE SPECIES PRESENT, SUGGEST RECOLLECTION (A)  Final   Report Status  09/16/2019 FINAL  Final  Blood culture (routine x 2)     Status: None   Collection Time: 09/14/19  1:48 PM   Specimen: Right Antecubital; Blood  Result Value Ref Range Status   Specimen Description   Final    RIGHT ANTECUBITAL BOTTLES DRAWN AEROBIC AND ANAEROBIC   Special Requests Blood Culture adequate volume  Final   Culture   Final    NO GROWTH 5 DAYS Performed at [facility], [address], [place], [place] [address]    Report Status 09/19/2019 FINAL  Final  Blood culture (routine x 2)     Status: None   Collection Time: 09/14/19  2:15 PM   Specimen: Right Antecubital; Blood  Result Value Ref Range Status   Specimen Description RIGHT  ANTECUBITAL BOTTLES DRAWN AEROBIC ONLY  Final   Special Requests Blood Culture adequate volume  Final   Culture   Final    NO GROWTH 5 DAYS Performed at [facility], [address], [place], [place] [address]    Report Status 09/19/2019 FINAL  Final  SARS CORONAVIRUS 2 (TAT 6-24 HRS) Nasopharyngeal Nasopharyngeal Swab     Status: None   Collection Time: 09/14/19  4:06 PM   Specimen: Nasopharyngeal Swab  Result Value Ref Range Status   SARS Coronavirus 2 NEGATIVE NEGATIVE Final    Comment: (NOTE) SARS-CoV-2 target nucleic acids are NOT DETECTED. The SARS-CoV-2 RNA is generally detectable in upper and lower respiratory specimens during the acute phase of infection. Negative results do not preclude SARS-CoV-2 infection, do not rule out co-infections with other pathogens, and should not be used as the sole basis for treatment or other patient management decisions. Negative results must be combined with clinical observations, patient history, and epidemiological information. The expected result is Negative. Fact Sheet for Patients: SugarRoll.be Fact Sheet for Healthcare Providers: https://www.woods-mathews.com/ This test is not yet approved or cleared by the [place] FDA and  has been authorized for  detection and/or diagnosis of SARS-CoV-2 by FDA under an Emergency Use Authorization (EUA). This EUA will remain  in effect (meaning this test can be used) for the duration of the COVID-19 declaration under Section 56 4(b)(1) of the Act, 21 U.S.C. section 360bbb-3(b)(1), unless the authorization is terminated or revoked sooner. Performed at [facility], [place] [address], [place], [place] [address]   MRSA PCR Screening     Status: None   Collection Time: 09/15/19  4:13 PM   Specimen: Nasopharyngeal  Result Value Ref Range Status   MRSA by PCR NEGATIVE NEGATIVE Final    Comment:        The GeneXpert MRSA Assay (FDA approved for NASAL specimens only), is one component of a comprehensive MRSA colonization surveillance program. It is not intended to diagnose MRSA infection nor to guide or monitor treatment for MRSA infections. Performed at [facility], [place] [address], [place], [place] [address]   SARS CORONAVIRUS 2 (TAT 6-24 HRS) Nasopharyngeal Nasopharyngeal Swab     Status: None   Collection Time: 09/17/19  5:37 PM   Specimen: Nasopharyngeal Swab  Result Value Ref Range Status   SARS Coronavirus 2 NEGATIVE NEGATIVE Final    Comment: (NOTE) SARS-CoV-2 target nucleic acids are NOT DETECTED. The SARS-CoV-2 RNA is generally detectable in upper and lower respiratory specimens during the acute phase of infection. Negative results do not preclude SARS-CoV-2 infection, do not rule out co-infections with other pathogens, and should not be used as the sole basis for treatment or other patient management decisions. Negative results must be combined with clinical observations, patient history, and epidemiological information. The expected result is Negative. Fact Sheet for Patients: SugarRoll.be Fact Sheet for Healthcare Providers: https://www.woods-mathews.com/ This test is not yet approved or cleared by the [place] FDA and   has been authorized for detection and/or diagnosis of SARS-CoV-2 by FDA under an Emergency Use Authorization (EUA). This EUA will remain  in effect (meaning this test can be used) for the duration of the COVID-19 declaration under Section 56 4(b)(1) of the Act, 21 U.S.C. section 360bbb-3(b)(1), unless the authorization is terminated or revoked sooner. Performed at [facility], [place] [address], [place], [place] [address]      Patient was seen and examined on the day of discharge  and was found to be in stable condition. Time coordinating discharge: 40 minutes including assessment and coordination of care, as well as examination of the patient.   SIGNED:  Dessa Phi, DO [facility] 09/19/2019, 10:01 AM

## 2019-09-18 NOTE — Progress Notes (Signed)
Physical Therapy Treatment Patient Details Name: Phillip Taylor MRN: [id number] DOB: Sep 02, 1925 Today's Date: 09/18/2019    History of Present Illness Phillip Taylor is an 85 y.o. male with history of atrial fibrillation on Eliquis, PE, CKD, carotid stenosis, HTN, hypothyroidism, vocal cord cancer s/p resection and dementia who presented from [place] for evaluation of AMS. Pt was having difficulty eating and speaking. Head CT was neg for acute abnormalities    PT Comments    Pt tolerated therapy well.  Focused on improving posterior lean in sitting and standing, as pt with strong posterior lean initially.  After EOB for several minutes with cues to lean forward or reach forward , pt able to maintain balance.  Required mod A for transfers and if ambulating away from bed would need assist of 2.     Follow Up Recommendations  SNF;Supervision/Assistance - 24 hour     Equipment Recommendations  None recommended by PT    Recommendations for Other Services       Precautions / Restrictions Precautions Precautions: Fall    Mobility  Bed Mobility Overal bed mobility: Needs Assistance Bed Mobility: Supine to Sit;Sit to Supine;Rolling Rolling: Min assist   Supine to sit: Mod assist Sit to supine: Mod assist   General bed mobility comments: tactile cues and cues for technique; mod A to power up trunk and to scoot forward  Transfers Overall transfer level: Needs assistance Equipment used: Rolling walker (2 wheeled) Transfers: Sit to/from Stand Sit to Stand: Mod assist;From elevated surface         General transfer comment: Prior to standing worked on sitting balance to limite posterior lead.  Performed sit to stand x 2 with mod A, facilitation and verbal cues for posture  Ambulation/Gait Ambulation/Gait assistance: Max assist Gait Distance (Feet): 2 Feet Assistive device: Rolling walker (2 wheeled) Gait Pattern/deviations: Step-to pattern;Decreased step length -  right;Decreased step length - left;Trunk flexed Gait velocity: decreased   General Gait Details: side steps to [facility]; cues and assist weight shift; mod support for balance and RW; did not ambulate away from bed as only assist of 1 available   Stairs             Wheelchair Mobility    Modified Rankin (Stroke Patients Only)       Balance Overall balance assessment: Needs assistance Sitting-balance support: Bilateral upper extremity supported;Feet supported Sitting balance-Leahy Scale: Poor Sitting balance - Comments: Pt initially with posterior lean required mod A for balance; sat EOB for 15 minutes during therapy; gradually worked on improving posture - worked on hands on knees, reaching for walker, leaning forward; after ~5-8 mins posture improved and no posterior lean; pt was able to take a few sips of water and reach for targets while sitting EOB with assist   Standing balance support: Bilateral upper extremity supported Standing balance-Leahy Scale: Poor Standing balance comment: requiring mod A and UE  Stood for 1 minute with RW with strong posterior lean; worked on posture with verbal and tactile cues for posterior lean; min improvement in standing posture                          Cognition Arousal/Alertness: Awake/alert Behavior During Therapy: Flat affect Overall Cognitive Status: No family/caregiver present to determine baseline cognitive functioning  Exercises General Exercises - Lower Extremity Ankle Circles/Pumps: AROM;Both;10 reps Long Arc Quad: AROM;Both;10 reps    General Comments General comments (skin integrity, edema, etc.): HR 89 bpm with mobility      Pertinent Vitals/Pain Pain Assessment: No/denies pain    Home Living                      Prior Function            PT Goals (current goals can now be found in the care plan section) Progress towards PT goals: Progressing  toward goals    Frequency    Min 2X/week      PT Plan Current plan remains appropriate    Co-evaluation              AM-PAC PT "6 Clicks" Mobility   Outcome Measure  Help needed turning from your back to your side while in a flat bed without using bedrails?: A Lot Help needed moving from lying on your back to sitting on the side of a flat bed without using bedrails?: A Lot Help needed moving to and from a bed to a chair (including a wheelchair)?: A Lot Help needed standing up from a chair using your arms (e.g., wheelchair or bedside chair)?: A Lot Help needed to walk in hospital room?: A Lot Help needed climbing 3-5 steps with a railing? : Total 6 Click Score: 11    End of Session Equipment Utilized During Treatment: Gait belt Activity Tolerance: Patient tolerated treatment well Patient left: in bed;with bed alarm set Nurse Communication: Mobility status       Time: 1735-1758 PT Time Calculation (min) (ACUTE ONLY): 23 min  Charges:  $Therapeutic Activity: 8-22 mins $Neuromuscular Re-education: 8-22 mins                     Maggie Font, PT Acute Rehab Services Pager [phone] [facility] [phone] [facility] [place] [place] 09/18/2019, 6:05 PM

## 2019-09-18 NOTE — NC FL2 (Addendum)
[place] LEVEL OF CARE SCREENING TOOL     IDENTIFICATION  Patient Name: Phillip Taylor Birthdate: 1924-11-24 Sex: male Admission Date (Current Location): 09/14/2019  [facility] and [place] Number:  [employer] and Address:  [facility], [place] [address], [place], [place] [address]      Provider Number: [id number]  Attending Physician Name and Address:  Dessa Phi, DO  Relative Name and Phone Number:       Current Level of Care: Hospital Recommended Level of Care: [place] Prior Approval Number:    Date Approved/Denied:   PASRR Number: [id number]  Discharge Plan: Other (Comment)([facility])    Current Diagnoses: Patient Active Problem List   Diagnosis Date Noted  . Stroke-like symptom 09/15/2019  . Speech abnormality 09/14/2019  . Acute parotitis 05/06/2019  . CKD (chronic kidney disease) stage 3, GFR 30-59 ml/min 05/05/2019  . Pulmonary embolism ([place]) 05/04/2019  . Atrial fibrillation ([place]) 05/04/2019  . GERD without esophagitis 04/17/2019  . Poor appetite 04/09/2019  . Dysphagia 04/09/2019  . Weight loss 04/07/2019  . Protein-calorie malnutrition, severe ([place]) 03/30/2019  . Elevated liver enzymes 03/29/2019  . Vaso vagal episode 03/29/2019  . Hypokalemia 03/20/2019  . Chronic constipation 03/12/2019  . Carotid artery stenosis 03/12/2019  . Carotid sinus hypersensitivity 03/12/2019  . Hypocalcemia 03/12/2019  . Acute blood loss anemia 03/12/2019  . Dementia without behavioral disturbance ([place]) 03/12/2019  . Intertrochanteric fracture of right femur, closed, initial encounter ([place]) 03/09/2019  . Acute dehydration 03/09/2019  . Osteoporosis 03/09/2019  . Essential hypertension 03/09/2019  . Hypothyroidism 03/09/2019  . Intertrochanteric fracture ([place]) 03/09/2019  . Heme positive stool 10/17/2012    Orientation RESPIRATION BLADDER Height & Weight     Self   Normal Incontinent, External catheter Weight: 134 lb 11.2 oz (61.1 kg) Height:  6' (182.9 cm)  BEHAVIORAL SYMPTOMS/MOOD NEUROLOGICAL BOWEL NUTRITION STATUS      Continent Diet (Dysphagia 2 (fine chop);Thin liquid)  AMBULATORY STATUS COMMUNICATION OF NEEDS Skin   Extensive Assist Verbally Other (Comment)(abrasion left knee with foam; generalized ecchymosis)                       Personal Care Assistance Level of Assistance  Bathing, Dressing, Feeding Bathing Assistance: Maximum assistance Feeding assistance: Limited assistance Dressing Assistance: Maximum assistance     Functional Limitations Info  Sight, Hearing, Speech Sight Info: Adequate Hearing Info: Adequate Speech Info: Adequate    SPECIAL CARE FACTORS FREQUENCY  OT (By licensed OT), PT (By licensed PT)     PT Frequency: 5x week OT Frequency: 5x week            Contractures Contractures Info: Not present    Additional Factors Info  Code Status, Allergies, Psychotropic, Isolation Precautions Code Status Info: DNR Allergies Info: Prevacid (Lansoprazole), Prilosec (Omeprazole), Codeine, Penicillins Psychotropic Info: mirtazapine (REMERON) tablet 7.5 mg daily at bedtime PO for appetite   Isolation Precautions Info: MRSA     Current Medications (09/18/2019):  This is the current hospital active medication list Current Facility-Administered Medications  Medication Dose Route Frequency Provider Last Rate Last Admin  .  stroke: mapping our early stages of recovery book   Does not apply Once Bethena Roys, MD   Stopped at 09/14/19 2046  . acetaminophen (TYLENOL) tablet 650 mg  650 mg Oral Q4H PRN Emokpae, Ejiroghene E, MD   650 mg at 09/18/19 [id number]   Or  .  acetaminophen (TYLENOL) 160 MG/5ML solution 650 mg  650 mg Per Tube Q4H PRN Emokpae, Ejiroghene E, MD       Or  . acetaminophen (TYLENOL) suppository 650 mg  650 mg Rectal Q4H PRN Emokpae, Ejiroghene E, MD      . apixaban (ELIQUIS) tablet 5 mg  5 mg Oral  BID Kyle, Tyrone A, DO   5 mg at 09/18/19 1017  . carvedilol (COREG) tablet 3.125 mg  3.125 mg Oral BID WC Rosalin Hawking, MD   3.125 mg at 09/18/19 [id number]  . dextrose 5 %-0.9 % sodium chloride infusion   Intravenous Continuous Emokpae, Ejiroghene E, MD   Stopped at 09/17/19 1753  . levothyroxine (SYNTHROID) tablet 75 mcg  75 mcg Oral QAC breakfast Rosalin Hawking, MD   75 mcg at 09/18/19 0640  . mirtazapine (REMERON) tablet 7.5 mg  7.5 mg Oral QHS Rosalin Hawking, MD   7.5 mg at 09/17/19 2229  . pantoprazole (PROTONIX) EC tablet 40 mg  40 mg Oral Daily Rosalin Hawking, MD   40 mg at 09/18/19 1017  . pravastatin (PRAVACHOL) tablet 20 mg  20 mg Oral q1800 Rosalin Hawking, MD   20 mg at 09/17/19 1733  . senna-docusate (Senokot-S) tablet 1 tablet  1 tablet Oral QHS PRN Emokpae, Ejiroghene E, MD      . vitamin B-12 (CYANOCOBALAMIN) tablet 1,000 mcg  1,000 mcg Oral Daily Rosalin Hawking, MD   1,000 mcg at 09/18/19 1018     Discharge Medications: Medication List        STOP taking these medications       clindamycin 300 MG capsule Commonly known as: CLEOCIN   fluconazole 100 MG tablet Commonly known as: Diflucan   levofloxacin 500 MG tablet Commonly known as: LEVAQUIN   NON FORMULARY             TAKE these medications       apixaban 5 MG Tabs tablet Commonly known as: ELIQUIS Take 1 tablet (5 mg total) by mouth 2 (two) times daily. What changed:   how much to take  additional instructions   calcium carbonate 750 MG chewable tablet Commonly known as: TUMS EX Chew 1 tablet by mouth 2 (two) times daily.   carvedilol 3.125 MG tablet Commonly known as: COREG Take 3.125 mg by mouth 2 (two) times daily with a meal.   ENSURE ENLIVE PO Take 1 Bottle by mouth daily.   feeding supplement (PRO-STAT SUGAR FREE 64) Liqd Take 30 mLs by mouth 2 (two) times daily between meals.   levothyroxine 75 MCG tablet Commonly known as: SYNTHROID Take 75 mcg by mouth daily before breakfast.    mirtazapine 7.5 MG tablet Commonly known as: REMERON Take 7.5 mg by mouth at bedtime.   pantoprazole 40 MG tablet Commonly known as: PROTONIX Take 40 mg by mouth daily.   potassium chloride SA 20 MEQ tablet Commonly known as: KLOR-CON Take 20 mEq by mouth daily.   pravastatin 20 MG tablet Commonly known as: PRAVACHOL Take 1 tablet (20 mg total) by mouth daily at 6 PM.   vitamin B-12 1000 MCG tablet Commonly known as: CYANOCOBALAMIN Take 1,000 mcg by mouth daily.     Relevant Imaging Results:  Relevant Lab Results:   Additional Information SSN: [place], [place]

## 2019-09-19 LAB — BASIC METABOLIC PANEL
Anion gap: 9 (ref 5–15)
BUN: 16 mg/dL (ref 8–23)
CO2: 20 mmol/L — ABNORMAL LOW (ref 22–32)
Calcium: 7.7 mg/dL — ABNORMAL LOW (ref 8.9–10.3)
Chloride: 109 mmol/L (ref 98–111)
Creatinine, Ser: 0.91 mg/dL (ref 0.61–1.24)
GFR calc Af Amer: >60 mL/min (ref >60)
GFR calc non Af Amer: >60 mL/min (ref >60)
Glucose, Bld: 105 mg/dL — ABNORMAL HIGH (ref 70–99)
Potassium: 3.3 mmol/L — ABNORMAL LOW (ref 3.5–5.1)
Sodium: 138 mmol/L (ref 135–145)

## 2019-09-19 LAB — CULTURE, BLOOD (ROUTINE X 2)
Culture: NO GROWTH
Culture: NO GROWTH
Special Requests: ADEQUATE
Special Requests: ADEQUATE

## 2019-09-19 LAB — MAGNESIUM: Magnesium: 1.6 mg/dL — ABNORMAL LOW (ref 1.7–2.4)

## 2019-09-19 MED ORDER — MAGNESIUM SULFATE 2 GM/50ML IV SOLN
2.0000 g | Freq: Once | INTRAVENOUS | Status: AC
  Administered 2019-09-19: 2 g via INTRAVENOUS
  Filled 2019-09-19: qty 50

## 2019-09-19 MED ORDER — PRAVASTATIN SODIUM 20 MG PO TABS: 20.0000 mg | ORAL_TABLET | Freq: Every day | ORAL | 2 refills | Status: AC

## 2019-09-19 MED ORDER — POTASSIUM CHLORIDE CRYS ER 20 MEQ PO TBCR
40.0000 meq | EXTENDED_RELEASE_TABLET | Freq: Once | ORAL | Status: AC
  Administered 2019-09-19: 40 meq via ORAL
  Filled 2019-09-19: qty 2

## 2019-09-19 MED ORDER — APIXABAN 5 MG PO TABS: 5.0000 mg | ORAL_TABLET | Freq: Two times a day (BID) | ORAL | 0 refills | Status: AC

## 2019-09-19 NOTE — TOC Transition Note (Signed)
Transition of Care [facility]) - CM/SW Discharge Note   Patient Details  Name: Phillip Taylor MRN: [id number] Date of Birth: 1925/03/23  [facility] CM/SW Contact:  Kirstie Peri, [place] Work Phone Number: 09/19/2019, 12:23 PM   Clinical Narrative:     Nurse to call report to [phone] RM# [phone]    Barriers to Discharge: Continued Medical Work up   Patient Goals and CMS Choice   CMS Medicare.gov Compare Post Acute Care list provided to:: Patient Represenative (must comment) Choice offered to / list presented to : Adult Children  Discharge Placement                       Discharge Plan and Services In-house Referral: Clinical Social Work Discharge Planning Services: CM Consult                                 Social Determinants of Health (SDOH) Interventions     Readmission Risk Interventions Readmission Risk Prevention Plan 09/17/2019 05/07/2019 05/06/2019  Post Dischage Appt - - -  Appt Comments - - -  Medication Screening - - -  Transportation Screening Complete - Complete  PCP or Specialist Appt within 3-5 Days Not Complete (No Data) Complete  Not Complete comments facility resident - -  [place] or Home Care Consult Complete - Complete  Social Work Consult for [place] Planning/Counseling Complete - Complete  Palliative Care Screening Not Applicable - Complete  Medication Review [employer] photographer) Complete - Complete  Some recent data might be hidden

## 2019-09-19 NOTE — Progress Notes (Signed)
  Speech Language Pathology Treatment: Dysphagia  Patient Details Name: Phillip Taylor MRN: [id number] DOB: 1925-10-03 Today's Date: 09/19/2019 Time: 0902-0921 SLP Time Calculation (min) (ACUTE ONLY): 19 min  Assessment / Plan / Recommendation Clinical Impression  Patient seen with breakfast meal. Pt sitting upright in bed. He was able to answer basic questions to make meal choices. Pt required full assistance and encouragement to eat solids, but was able to hold cup with straw for liquids. Prolonged mastication and oral transfer with all bolus types, but no s/s of aspiration. Patient verbally communicated he was done with meal "I don't think I want anymore". His voice remains clear throughout meal. Recommend continuation of Dys 2, chopped consistencies to reduce aid in prolonged mastication with thin liquids. No further skilled services are needed from Speech Therapy at the Acute level of care. Recommend Speech Therapy at pt's facility monitor pt's diet tolerance and need for diet changes.    HPI HPI: Pt is a 85 yo male admitted from SNF with AMS, not speaking. CT Head did not show acute changes; per MD note, exam is more consistent with cognitive impairment than stroke. CXR also without acute findings. Pt had a swallow evaluation in June 2020 with hoarse vocal quality and coughing with dry solids and straw. Dys 3 diet and thin liquids recommended Pt had reported at that time that he got "strangled easily." PMH includes: laryngeal ca s/p resection and XRT, GERD, HTN, carotid stenosis, CKD, A fib, on Eliquis, dementia, HTN, anemia, PE      SLP Plan  Continue with current plan of care       Recommendations  Diet recommendations: Dysphagia 2 (fine chop);Thin liquid Liquids provided via: Cup;Straw Medication Administration: Crushed with puree Supervision: Full supervision/cueing for compensatory strategies;Staff to assist with self feeding Compensations: Slow rate;Small sips/bites;Follow solids  with liquid Postural Changes and/or Swallow Maneuvers: Seated upright 90 degrees;Upright 30-60 min after meal                Plan: Continue with current plan of care       GO                Wynelle Bourgeois., MA, CCC-SLP 09/19/2019, 9:35 AM

## 2019-09-20 DIAGNOSIS — E43 Unspecified severe protein-calorie malnutrition: Secondary | ICD-10-CM | POA: Diagnosis not present

## 2019-09-20 DIAGNOSIS — E039 Hypothyroidism, unspecified: Secondary | ICD-10-CM | POA: Diagnosis not present

## 2019-09-20 DIAGNOSIS — G9341 Metabolic encephalopathy: Secondary | ICD-10-CM | POA: Diagnosis not present

## 2019-09-20 DIAGNOSIS — K219 Gastro-esophageal reflux disease without esophagitis: Secondary | ICD-10-CM | POA: Diagnosis not present

## 2019-09-20 DIAGNOSIS — E785 Hyperlipidemia, unspecified: Secondary | ICD-10-CM | POA: Diagnosis not present

## 2019-09-20 DIAGNOSIS — I959 Hypotension, unspecified: Secondary | ICD-10-CM | POA: Diagnosis not present

## 2019-09-20 DIAGNOSIS — I129 Hypertensive chronic kidney disease with stage 1 through stage 4 chronic kidney disease, or unspecified chronic kidney disease: Secondary | ICD-10-CM | POA: Diagnosis not present

## 2019-09-20 DIAGNOSIS — N183 Chronic kidney disease, stage 3 unspecified: Secondary | ICD-10-CM | POA: Diagnosis not present

## 2019-09-20 DIAGNOSIS — F0390 Unspecified dementia without behavioral disturbance: Secondary | ICD-10-CM | POA: Diagnosis not present

## 2019-09-20 DIAGNOSIS — Z7901 Long term (current) use of anticoagulants: Secondary | ICD-10-CM | POA: Diagnosis not present

## 2019-09-20 DIAGNOSIS — M810 Age-related osteoporosis without current pathological fracture: Secondary | ICD-10-CM | POA: Diagnosis not present

## 2019-09-20 DIAGNOSIS — I2699 Other pulmonary embolism without acute cor pulmonale: Secondary | ICD-10-CM | POA: Diagnosis not present

## 2019-09-20 DIAGNOSIS — I480 Paroxysmal atrial fibrillation: Secondary | ICD-10-CM | POA: Diagnosis not present

## 2019-09-20 DIAGNOSIS — M6281 Muscle weakness (generalized): Secondary | ICD-10-CM | POA: Diagnosis not present

## 2019-09-20 DIAGNOSIS — E860 Dehydration: Secondary | ICD-10-CM | POA: Diagnosis not present

## 2019-09-23 DIAGNOSIS — I959 Hypotension, unspecified: Secondary | ICD-10-CM | POA: Diagnosis not present

## 2019-09-23 DIAGNOSIS — E43 Unspecified severe protein-calorie malnutrition: Secondary | ICD-10-CM | POA: Diagnosis not present

## 2019-09-23 DIAGNOSIS — G9341 Metabolic encephalopathy: Secondary | ICD-10-CM | POA: Diagnosis not present

## 2019-09-23 DIAGNOSIS — F0390 Unspecified dementia without behavioral disturbance: Secondary | ICD-10-CM | POA: Diagnosis not present

## 2019-09-23 DIAGNOSIS — I2699 Other pulmonary embolism without acute cor pulmonale: Secondary | ICD-10-CM | POA: Diagnosis not present

## 2019-09-23 DIAGNOSIS — E860 Dehydration: Secondary | ICD-10-CM | POA: Diagnosis not present

## 2019-09-24 DIAGNOSIS — E43 Unspecified severe protein-calorie malnutrition: Secondary | ICD-10-CM | POA: Diagnosis not present

## 2019-09-24 DIAGNOSIS — F0390 Unspecified dementia without behavioral disturbance: Secondary | ICD-10-CM | POA: Diagnosis not present

## 2019-09-24 DIAGNOSIS — G9341 Metabolic encephalopathy: Secondary | ICD-10-CM | POA: Diagnosis not present

## 2019-09-24 DIAGNOSIS — E860 Dehydration: Secondary | ICD-10-CM | POA: Diagnosis not present

## 2019-09-24 DIAGNOSIS — I2699 Other pulmonary embolism without acute cor pulmonale: Secondary | ICD-10-CM | POA: Diagnosis not present

## 2019-09-24 DIAGNOSIS — I959 Hypotension, unspecified: Secondary | ICD-10-CM | POA: Diagnosis not present

## 2019-09-26 DIAGNOSIS — E43 Unspecified severe protein-calorie malnutrition: Secondary | ICD-10-CM | POA: Diagnosis not present

## 2019-09-26 DIAGNOSIS — E860 Dehydration: Secondary | ICD-10-CM | POA: Diagnosis not present

## 2019-09-26 DIAGNOSIS — I959 Hypotension, unspecified: Secondary | ICD-10-CM | POA: Diagnosis not present

## 2019-09-26 DIAGNOSIS — I2699 Other pulmonary embolism without acute cor pulmonale: Secondary | ICD-10-CM | POA: Diagnosis not present

## 2019-09-26 DIAGNOSIS — G9341 Metabolic encephalopathy: Secondary | ICD-10-CM | POA: Diagnosis not present

## 2019-09-26 DIAGNOSIS — F0390 Unspecified dementia without behavioral disturbance: Secondary | ICD-10-CM | POA: Diagnosis not present

## 2019-09-30 DIAGNOSIS — E860 Dehydration: Secondary | ICD-10-CM | POA: Diagnosis not present

## 2019-09-30 DIAGNOSIS — G9341 Metabolic encephalopathy: Secondary | ICD-10-CM | POA: Diagnosis not present

## 2019-09-30 DIAGNOSIS — I959 Hypotension, unspecified: Secondary | ICD-10-CM | POA: Diagnosis not present

## 2019-09-30 DIAGNOSIS — E43 Unspecified severe protein-calorie malnutrition: Secondary | ICD-10-CM | POA: Diagnosis not present

## 2019-09-30 DIAGNOSIS — F0390 Unspecified dementia without behavioral disturbance: Secondary | ICD-10-CM | POA: Diagnosis not present

## 2019-09-30 DIAGNOSIS — I2699 Other pulmonary embolism without acute cor pulmonale: Secondary | ICD-10-CM | POA: Diagnosis not present

## 2019-10-01 DIAGNOSIS — E860 Dehydration: Secondary | ICD-10-CM | POA: Diagnosis not present

## 2019-10-01 DIAGNOSIS — E43 Unspecified severe protein-calorie malnutrition: Secondary | ICD-10-CM | POA: Diagnosis not present

## 2019-10-01 DIAGNOSIS — I959 Hypotension, unspecified: Secondary | ICD-10-CM | POA: Diagnosis not present

## 2019-10-01 DIAGNOSIS — G9341 Metabolic encephalopathy: Secondary | ICD-10-CM | POA: Diagnosis not present

## 2019-10-01 DIAGNOSIS — I2699 Other pulmonary embolism without acute cor pulmonale: Secondary | ICD-10-CM | POA: Diagnosis not present

## 2019-10-01 DIAGNOSIS — F0390 Unspecified dementia without behavioral disturbance: Secondary | ICD-10-CM | POA: Diagnosis not present

## 2019-10-02 DIAGNOSIS — K2100 Gastro-esophageal reflux disease with esophagitis, without bleeding: Secondary | ICD-10-CM | POA: Diagnosis not present

## 2019-10-02 DIAGNOSIS — E039 Hypothyroidism, unspecified: Secondary | ICD-10-CM | POA: Diagnosis not present

## 2019-10-02 DIAGNOSIS — M80051A Age-related osteoporosis with current pathological fracture, right femur, initial encounter for fracture: Secondary | ICD-10-CM | POA: Diagnosis not present

## 2019-10-02 DIAGNOSIS — I6529 Occlusion and stenosis of unspecified carotid artery: Secondary | ICD-10-CM | POA: Diagnosis not present

## 2019-10-02 DIAGNOSIS — I10 Essential (primary) hypertension: Secondary | ICD-10-CM | POA: Diagnosis not present

## 2019-10-02 DIAGNOSIS — E43 Unspecified severe protein-calorie malnutrition: Secondary | ICD-10-CM | POA: Diagnosis not present

## 2019-10-02 DIAGNOSIS — F0390 Unspecified dementia without behavioral disturbance: Secondary | ICD-10-CM | POA: Diagnosis not present

## 2019-10-02 DIAGNOSIS — Z950 Presence of cardiac pacemaker: Secondary | ICD-10-CM | POA: Diagnosis not present

## 2019-10-02 DIAGNOSIS — I2699 Other pulmonary embolism without acute cor pulmonale: Secondary | ICD-10-CM | POA: Diagnosis not present

## 2019-10-02 DIAGNOSIS — R4702 Dysphasia: Secondary | ICD-10-CM | POA: Diagnosis not present

## 2019-10-03 DIAGNOSIS — I10 Essential (primary) hypertension: Secondary | ICD-10-CM | POA: Diagnosis not present

## 2019-10-03 DIAGNOSIS — E43 Unspecified severe protein-calorie malnutrition: Secondary | ICD-10-CM | POA: Diagnosis not present

## 2019-10-03 DIAGNOSIS — I6529 Occlusion and stenosis of unspecified carotid artery: Secondary | ICD-10-CM | POA: Diagnosis not present

## 2019-10-03 DIAGNOSIS — K2100 Gastro-esophageal reflux disease with esophagitis, without bleeding: Secondary | ICD-10-CM | POA: Diagnosis not present

## 2019-10-03 DIAGNOSIS — F0390 Unspecified dementia without behavioral disturbance: Secondary | ICD-10-CM | POA: Diagnosis not present

## 2019-10-03 DIAGNOSIS — M80051A Age-related osteoporosis with current pathological fracture, right femur, initial encounter for fracture: Secondary | ICD-10-CM | POA: Diagnosis not present

## 2019-10-03 DIAGNOSIS — D510 Vitamin B12 deficiency anemia due to intrinsic factor deficiency: Secondary | ICD-10-CM | POA: Diagnosis not present

## 2019-10-04 DIAGNOSIS — F0390 Unspecified dementia without behavioral disturbance: Secondary | ICD-10-CM | POA: Diagnosis not present

## 2019-10-04 DIAGNOSIS — I10 Essential (primary) hypertension: Secondary | ICD-10-CM | POA: Diagnosis not present

## 2019-10-04 DIAGNOSIS — E039 Hypothyroidism, unspecified: Secondary | ICD-10-CM | POA: Diagnosis not present

## 2019-10-04 DIAGNOSIS — K2100 Gastro-esophageal reflux disease with esophagitis, without bleeding: Secondary | ICD-10-CM | POA: Diagnosis not present

## 2019-10-04 DIAGNOSIS — Z950 Presence of cardiac pacemaker: Secondary | ICD-10-CM | POA: Diagnosis not present

## 2019-10-04 DIAGNOSIS — I2699 Other pulmonary embolism without acute cor pulmonale: Secondary | ICD-10-CM | POA: Diagnosis not present

## 2019-10-04 DIAGNOSIS — I6529 Occlusion and stenosis of unspecified carotid artery: Secondary | ICD-10-CM | POA: Diagnosis not present

## 2019-10-04 DIAGNOSIS — R4702 Dysphasia: Secondary | ICD-10-CM | POA: Diagnosis not present

## 2019-10-04 DIAGNOSIS — M80051A Age-related osteoporosis with current pathological fracture, right femur, initial encounter for fracture: Secondary | ICD-10-CM | POA: Diagnosis not present

## 2019-10-04 DIAGNOSIS — E43 Unspecified severe protein-calorie malnutrition: Secondary | ICD-10-CM | POA: Diagnosis not present

## 2019-10-07 DIAGNOSIS — E43 Unspecified severe protein-calorie malnutrition: Secondary | ICD-10-CM | POA: Diagnosis not present

## 2019-10-07 DIAGNOSIS — M80051A Age-related osteoporosis with current pathological fracture, right femur, initial encounter for fracture: Secondary | ICD-10-CM | POA: Diagnosis not present

## 2019-10-07 DIAGNOSIS — K2100 Gastro-esophageal reflux disease with esophagitis, without bleeding: Secondary | ICD-10-CM | POA: Diagnosis not present

## 2019-10-07 DIAGNOSIS — I10 Essential (primary) hypertension: Secondary | ICD-10-CM | POA: Diagnosis not present

## 2019-10-07 DIAGNOSIS — F0390 Unspecified dementia without behavioral disturbance: Secondary | ICD-10-CM | POA: Diagnosis not present

## 2019-10-07 DIAGNOSIS — I6529 Occlusion and stenosis of unspecified carotid artery: Secondary | ICD-10-CM | POA: Diagnosis not present

## 2019-10-08 DIAGNOSIS — Z23 Encounter for immunization: Secondary | ICD-10-CM | POA: Diagnosis not present

## 2019-10-10 DIAGNOSIS — M80051A Age-related osteoporosis with current pathological fracture, right femur, initial encounter for fracture: Secondary | ICD-10-CM | POA: Diagnosis not present

## 2019-10-10 DIAGNOSIS — K2100 Gastro-esophageal reflux disease with esophagitis, without bleeding: Secondary | ICD-10-CM | POA: Diagnosis not present

## 2019-10-10 DIAGNOSIS — F0390 Unspecified dementia without behavioral disturbance: Secondary | ICD-10-CM | POA: Diagnosis not present

## 2019-10-10 DIAGNOSIS — E43 Unspecified severe protein-calorie malnutrition: Secondary | ICD-10-CM | POA: Diagnosis not present

## 2019-10-10 DIAGNOSIS — I10 Essential (primary) hypertension: Secondary | ICD-10-CM | POA: Diagnosis not present

## 2019-10-10 DIAGNOSIS — I6529 Occlusion and stenosis of unspecified carotid artery: Secondary | ICD-10-CM | POA: Diagnosis not present

## 2019-10-11 DIAGNOSIS — I6529 Occlusion and stenosis of unspecified carotid artery: Secondary | ICD-10-CM | POA: Diagnosis not present

## 2019-10-11 DIAGNOSIS — E43 Unspecified severe protein-calorie malnutrition: Secondary | ICD-10-CM | POA: Diagnosis not present

## 2019-10-11 DIAGNOSIS — M80051A Age-related osteoporosis with current pathological fracture, right femur, initial encounter for fracture: Secondary | ICD-10-CM | POA: Diagnosis not present

## 2019-10-11 DIAGNOSIS — F0390 Unspecified dementia without behavioral disturbance: Secondary | ICD-10-CM | POA: Diagnosis not present

## 2019-10-11 DIAGNOSIS — I10 Essential (primary) hypertension: Secondary | ICD-10-CM | POA: Diagnosis not present

## 2019-10-11 DIAGNOSIS — K2100 Gastro-esophageal reflux disease with esophagitis, without bleeding: Secondary | ICD-10-CM | POA: Diagnosis not present

## 2019-10-12 DIAGNOSIS — F0390 Unspecified dementia without behavioral disturbance: Secondary | ICD-10-CM | POA: Diagnosis not present

## 2019-10-12 DIAGNOSIS — I10 Essential (primary) hypertension: Secondary | ICD-10-CM | POA: Diagnosis not present

## 2019-10-12 DIAGNOSIS — I6529 Occlusion and stenosis of unspecified carotid artery: Secondary | ICD-10-CM | POA: Diagnosis not present

## 2019-10-12 DIAGNOSIS — E43 Unspecified severe protein-calorie malnutrition: Secondary | ICD-10-CM | POA: Diagnosis not present

## 2019-10-12 DIAGNOSIS — M80051A Age-related osteoporosis with current pathological fracture, right femur, initial encounter for fracture: Secondary | ICD-10-CM | POA: Diagnosis not present

## 2019-10-12 DIAGNOSIS — K2100 Gastro-esophageal reflux disease with esophagitis, without bleeding: Secondary | ICD-10-CM | POA: Diagnosis not present

## 2019-10-13 DIAGNOSIS — M80051A Age-related osteoporosis with current pathological fracture, right femur, initial encounter for fracture: Secondary | ICD-10-CM | POA: Diagnosis not present

## 2019-10-13 DIAGNOSIS — I10 Essential (primary) hypertension: Secondary | ICD-10-CM | POA: Diagnosis not present

## 2019-10-13 DIAGNOSIS — F0390 Unspecified dementia without behavioral disturbance: Secondary | ICD-10-CM | POA: Diagnosis not present

## 2019-10-13 DIAGNOSIS — K2100 Gastro-esophageal reflux disease with esophagitis, without bleeding: Secondary | ICD-10-CM | POA: Diagnosis not present

## 2019-10-13 DIAGNOSIS — E43 Unspecified severe protein-calorie malnutrition: Secondary | ICD-10-CM | POA: Diagnosis not present

## 2019-10-13 DIAGNOSIS — I6529 Occlusion and stenosis of unspecified carotid artery: Secondary | ICD-10-CM | POA: Diagnosis not present

## 2019-10-14 DIAGNOSIS — E43 Unspecified severe protein-calorie malnutrition: Secondary | ICD-10-CM | POA: Diagnosis not present

## 2019-10-14 DIAGNOSIS — F0390 Unspecified dementia without behavioral disturbance: Secondary | ICD-10-CM | POA: Diagnosis not present

## 2019-10-14 DIAGNOSIS — K2100 Gastro-esophageal reflux disease with esophagitis, without bleeding: Secondary | ICD-10-CM | POA: Diagnosis not present

## 2019-10-14 DIAGNOSIS — I6529 Occlusion and stenosis of unspecified carotid artery: Secondary | ICD-10-CM | POA: Diagnosis not present

## 2019-10-14 DIAGNOSIS — I10 Essential (primary) hypertension: Secondary | ICD-10-CM | POA: Diagnosis not present

## 2019-10-14 DIAGNOSIS — M80051A Age-related osteoporosis with current pathological fracture, right femur, initial encounter for fracture: Secondary | ICD-10-CM | POA: Diagnosis not present

## 2019-10-15 DIAGNOSIS — E43 Unspecified severe protein-calorie malnutrition: Secondary | ICD-10-CM | POA: Diagnosis not present

## 2019-10-15 DIAGNOSIS — M80051A Age-related osteoporosis with current pathological fracture, right femur, initial encounter for fracture: Secondary | ICD-10-CM | POA: Diagnosis not present

## 2019-10-15 DIAGNOSIS — K2100 Gastro-esophageal reflux disease with esophagitis, without bleeding: Secondary | ICD-10-CM | POA: Diagnosis not present

## 2019-10-15 DIAGNOSIS — I6529 Occlusion and stenosis of unspecified carotid artery: Secondary | ICD-10-CM | POA: Diagnosis not present

## 2019-10-15 DIAGNOSIS — F0390 Unspecified dementia without behavioral disturbance: Secondary | ICD-10-CM | POA: Diagnosis not present

## 2019-10-15 DIAGNOSIS — I10 Essential (primary) hypertension: Secondary | ICD-10-CM | POA: Diagnosis not present

## 2019-10-16 DIAGNOSIS — K2100 Gastro-esophageal reflux disease with esophagitis, without bleeding: Secondary | ICD-10-CM | POA: Diagnosis not present

## 2019-10-16 DIAGNOSIS — I10 Essential (primary) hypertension: Secondary | ICD-10-CM | POA: Diagnosis not present

## 2019-10-16 DIAGNOSIS — E43 Unspecified severe protein-calorie malnutrition: Secondary | ICD-10-CM | POA: Diagnosis not present

## 2019-10-16 DIAGNOSIS — M80051A Age-related osteoporosis with current pathological fracture, right femur, initial encounter for fracture: Secondary | ICD-10-CM | POA: Diagnosis not present

## 2019-10-16 DIAGNOSIS — F0390 Unspecified dementia without behavioral disturbance: Secondary | ICD-10-CM | POA: Diagnosis not present

## 2019-10-16 DIAGNOSIS — I6529 Occlusion and stenosis of unspecified carotid artery: Secondary | ICD-10-CM | POA: Diagnosis not present

## 2019-10-17 DIAGNOSIS — M80051A Age-related osteoporosis with current pathological fracture, right femur, initial encounter for fracture: Secondary | ICD-10-CM | POA: Diagnosis not present

## 2019-10-17 DIAGNOSIS — Z0000 Encounter for general adult medical examination without abnormal findings: Secondary | ICD-10-CM | POA: Diagnosis not present

## 2019-10-17 DIAGNOSIS — E43 Unspecified severe protein-calorie malnutrition: Secondary | ICD-10-CM | POA: Diagnosis not present

## 2019-10-17 DIAGNOSIS — I6529 Occlusion and stenosis of unspecified carotid artery: Secondary | ICD-10-CM | POA: Diagnosis not present

## 2019-10-17 DIAGNOSIS — K2100 Gastro-esophageal reflux disease with esophagitis, without bleeding: Secondary | ICD-10-CM | POA: Diagnosis not present

## 2019-10-17 DIAGNOSIS — F0390 Unspecified dementia without behavioral disturbance: Secondary | ICD-10-CM | POA: Diagnosis not present

## 2019-10-17 DIAGNOSIS — I10 Essential (primary) hypertension: Secondary | ICD-10-CM | POA: Diagnosis not present

## 2019-10-18 DIAGNOSIS — I6529 Occlusion and stenosis of unspecified carotid artery: Secondary | ICD-10-CM | POA: Diagnosis not present

## 2019-10-18 DIAGNOSIS — F0390 Unspecified dementia without behavioral disturbance: Secondary | ICD-10-CM | POA: Diagnosis not present

## 2019-10-18 DIAGNOSIS — M80051A Age-related osteoporosis with current pathological fracture, right femur, initial encounter for fracture: Secondary | ICD-10-CM | POA: Diagnosis not present

## 2019-10-18 DIAGNOSIS — E43 Unspecified severe protein-calorie malnutrition: Secondary | ICD-10-CM | POA: Diagnosis not present

## 2019-10-18 DIAGNOSIS — K2100 Gastro-esophageal reflux disease with esophagitis, without bleeding: Secondary | ICD-10-CM | POA: Diagnosis not present

## 2019-10-18 DIAGNOSIS — I10 Essential (primary) hypertension: Secondary | ICD-10-CM | POA: Diagnosis not present

## 2019-10-19 DIAGNOSIS — F0390 Unspecified dementia without behavioral disturbance: Secondary | ICD-10-CM | POA: Diagnosis not present

## 2019-10-19 DIAGNOSIS — I10 Essential (primary) hypertension: Secondary | ICD-10-CM | POA: Diagnosis not present

## 2019-10-19 DIAGNOSIS — E43 Unspecified severe protein-calorie malnutrition: Secondary | ICD-10-CM | POA: Diagnosis not present

## 2019-10-19 DIAGNOSIS — M80051A Age-related osteoporosis with current pathological fracture, right femur, initial encounter for fracture: Secondary | ICD-10-CM | POA: Diagnosis not present

## 2019-10-19 DIAGNOSIS — I6529 Occlusion and stenosis of unspecified carotid artery: Secondary | ICD-10-CM | POA: Diagnosis not present

## 2019-10-19 DIAGNOSIS — K2100 Gastro-esophageal reflux disease with esophagitis, without bleeding: Secondary | ICD-10-CM | POA: Diagnosis not present

## 2019-10-20 DIAGNOSIS — M80051A Age-related osteoporosis with current pathological fracture, right femur, initial encounter for fracture: Secondary | ICD-10-CM | POA: Diagnosis not present

## 2019-10-20 DIAGNOSIS — I10 Essential (primary) hypertension: Secondary | ICD-10-CM | POA: Diagnosis not present

## 2019-10-20 DIAGNOSIS — F0390 Unspecified dementia without behavioral disturbance: Secondary | ICD-10-CM | POA: Diagnosis not present

## 2019-10-20 DIAGNOSIS — I6529 Occlusion and stenosis of unspecified carotid artery: Secondary | ICD-10-CM | POA: Diagnosis not present

## 2019-10-20 DIAGNOSIS — E43 Unspecified severe protein-calorie malnutrition: Secondary | ICD-10-CM | POA: Diagnosis not present

## 2019-10-20 DIAGNOSIS — K2100 Gastro-esophageal reflux disease with esophagitis, without bleeding: Secondary | ICD-10-CM | POA: Diagnosis not present

## 2019-10-21 DIAGNOSIS — M80051A Age-related osteoporosis with current pathological fracture, right femur, initial encounter for fracture: Secondary | ICD-10-CM | POA: Diagnosis not present

## 2019-10-21 DIAGNOSIS — I6529 Occlusion and stenosis of unspecified carotid artery: Secondary | ICD-10-CM | POA: Diagnosis not present

## 2019-10-21 DIAGNOSIS — I10 Essential (primary) hypertension: Secondary | ICD-10-CM | POA: Diagnosis not present

## 2019-10-21 DIAGNOSIS — F0390 Unspecified dementia without behavioral disturbance: Secondary | ICD-10-CM | POA: Diagnosis not present

## 2019-10-21 DIAGNOSIS — K2100 Gastro-esophageal reflux disease with esophagitis, without bleeding: Secondary | ICD-10-CM | POA: Diagnosis not present

## 2019-10-21 DIAGNOSIS — E43 Unspecified severe protein-calorie malnutrition: Secondary | ICD-10-CM | POA: Diagnosis not present

## 2019-10-22 DIAGNOSIS — I6529 Occlusion and stenosis of unspecified carotid artery: Secondary | ICD-10-CM | POA: Diagnosis not present

## 2019-10-22 DIAGNOSIS — M80051A Age-related osteoporosis with current pathological fracture, right femur, initial encounter for fracture: Secondary | ICD-10-CM | POA: Diagnosis not present

## 2019-10-22 DIAGNOSIS — I10 Essential (primary) hypertension: Secondary | ICD-10-CM | POA: Diagnosis not present

## 2019-10-22 DIAGNOSIS — K2100 Gastro-esophageal reflux disease with esophagitis, without bleeding: Secondary | ICD-10-CM | POA: Diagnosis not present

## 2019-10-22 DIAGNOSIS — E43 Unspecified severe protein-calorie malnutrition: Secondary | ICD-10-CM | POA: Diagnosis not present

## 2019-10-22 DIAGNOSIS — F0390 Unspecified dementia without behavioral disturbance: Secondary | ICD-10-CM | POA: Diagnosis not present

## 2019-10-23 DIAGNOSIS — E43 Unspecified severe protein-calorie malnutrition: Secondary | ICD-10-CM | POA: Diagnosis not present

## 2019-10-23 DIAGNOSIS — I6529 Occlusion and stenosis of unspecified carotid artery: Secondary | ICD-10-CM | POA: Diagnosis not present

## 2019-10-23 DIAGNOSIS — I10 Essential (primary) hypertension: Secondary | ICD-10-CM | POA: Diagnosis not present

## 2019-10-23 DIAGNOSIS — Z20828 Contact with and (suspected) exposure to other viral communicable diseases: Secondary | ICD-10-CM | POA: Diagnosis not present

## 2019-10-23 DIAGNOSIS — F0390 Unspecified dementia without behavioral disturbance: Secondary | ICD-10-CM | POA: Diagnosis not present

## 2019-10-23 DIAGNOSIS — K2100 Gastro-esophageal reflux disease with esophagitis, without bleeding: Secondary | ICD-10-CM | POA: Diagnosis not present

## 2019-10-23 DIAGNOSIS — M80051A Age-related osteoporosis with current pathological fracture, right femur, initial encounter for fracture: Secondary | ICD-10-CM | POA: Diagnosis not present

## 2019-11-04 DEATH — deceased

## 2021-09-26 IMAGING — CT CT HEAD CODE STROKE
3 series · 15 of 47 positions shown, 18 images · non-contrast
Comparison: CT head 05/04/2019

CLINICAL DATA: Code stroke.  Altered mental status

EXAM:
CT HEAD WITHOUT CONTRAST
TECHNIQUE: Contiguous axial images were obtained from the base of the skull
through the vertex without intravenous contrast.

[Series 2: head w o · axial · 0.47mm/px · z∈[+185,+320]mm · 9 of 33 slices shown, 12 images]
[im 3/33  brain]
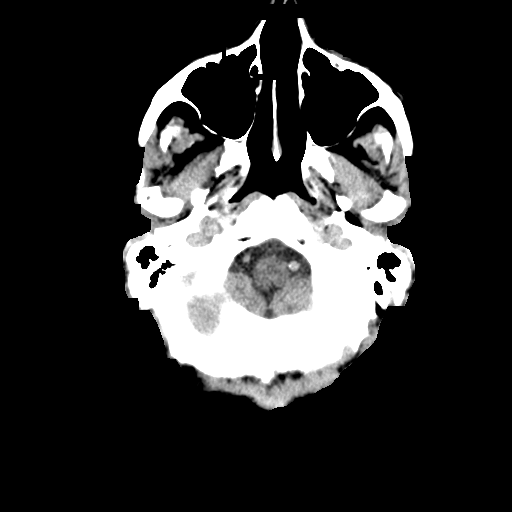
[im 3/33  bone]
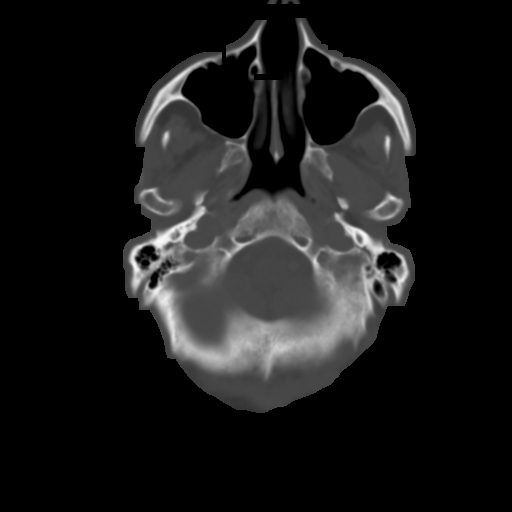
[im 6/33  brain]
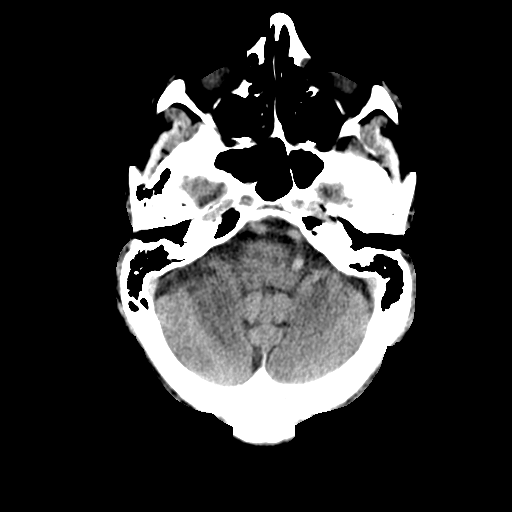
[im 9/33  brain]
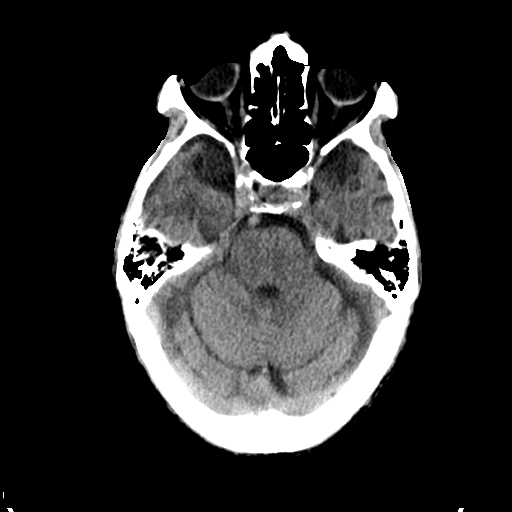
[im 13/33  brain]
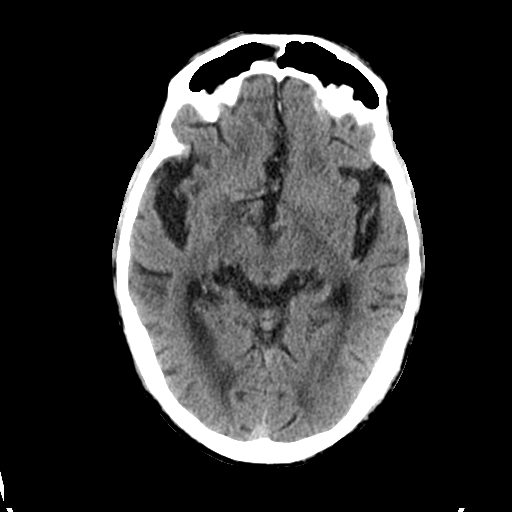
[im 17/33  brain]
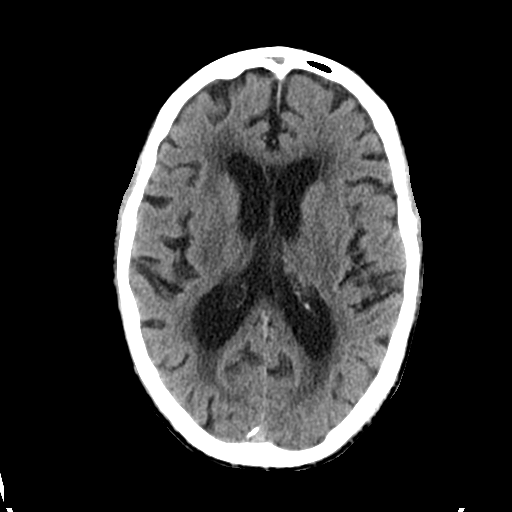
[im 17/33  bone]
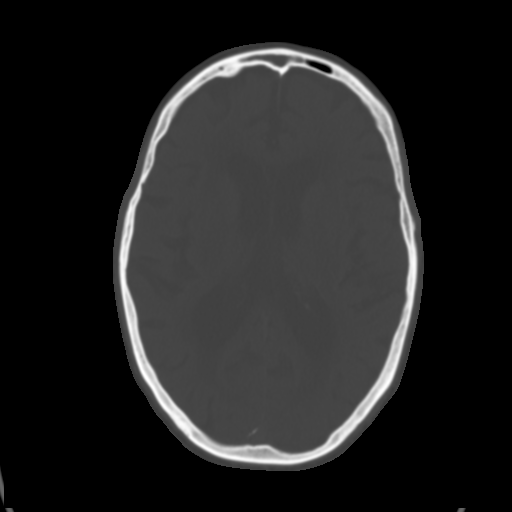
[im 20/33  brain]
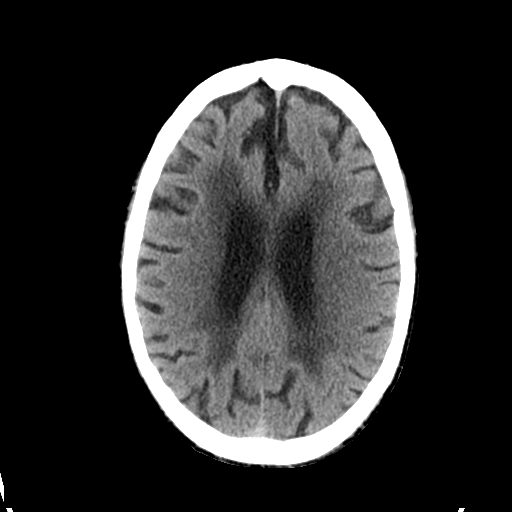
[im 24/33  brain]
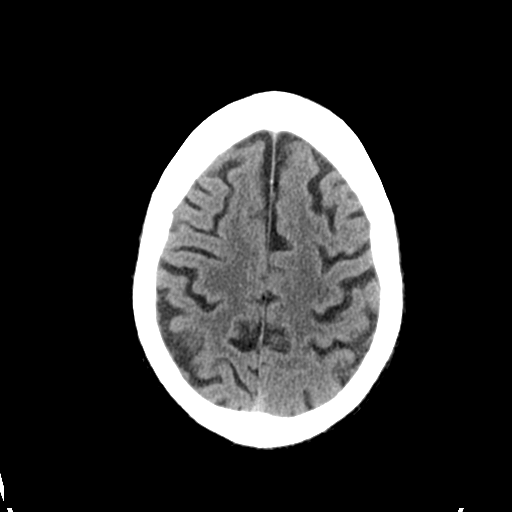
[im 27/33  brain]
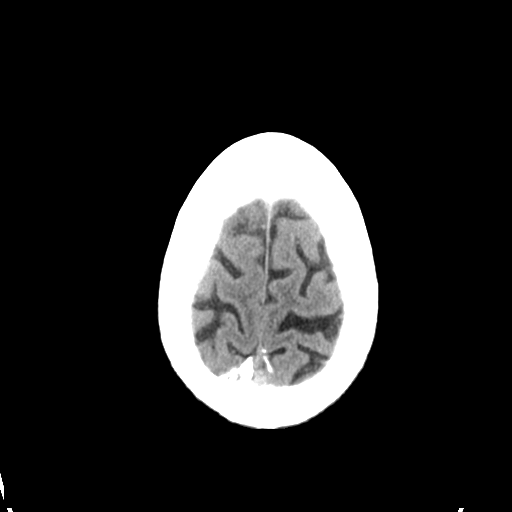
[im 30/33  brain]
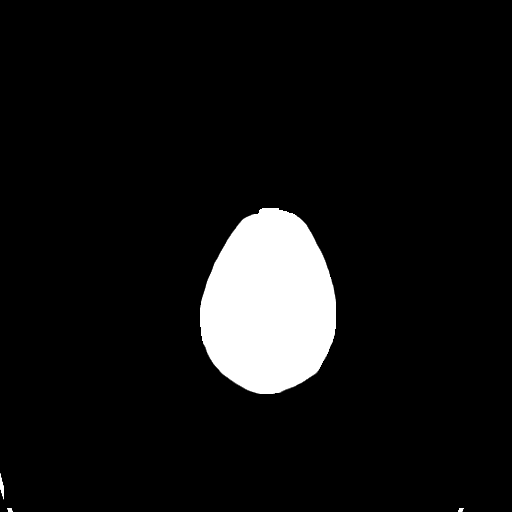
[im 30/33  bone]
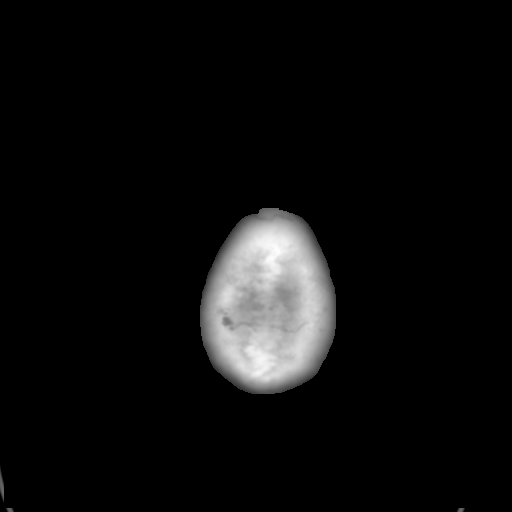

[Series 4: coronal soft · coronal · 0.34mm/px · 3 of 71 slices shown]
[im 24/71  brain]
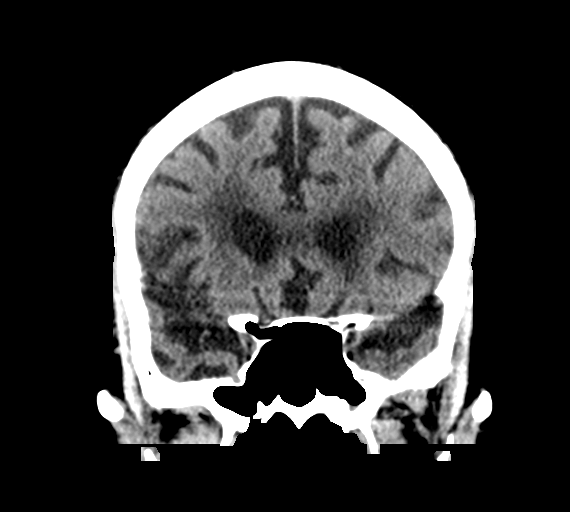
[im 32/71  brain]
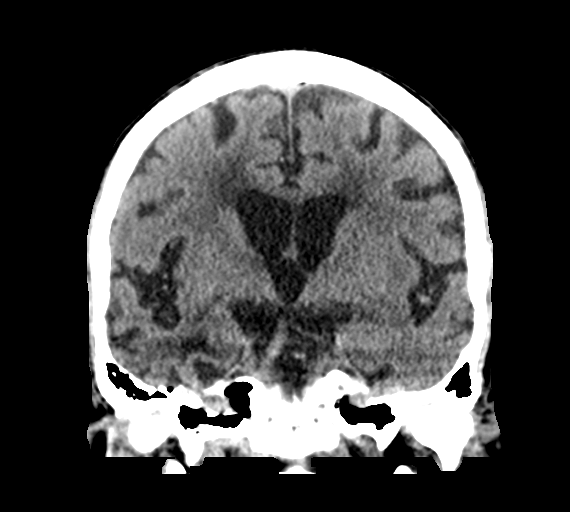
[im 39/71  brain]
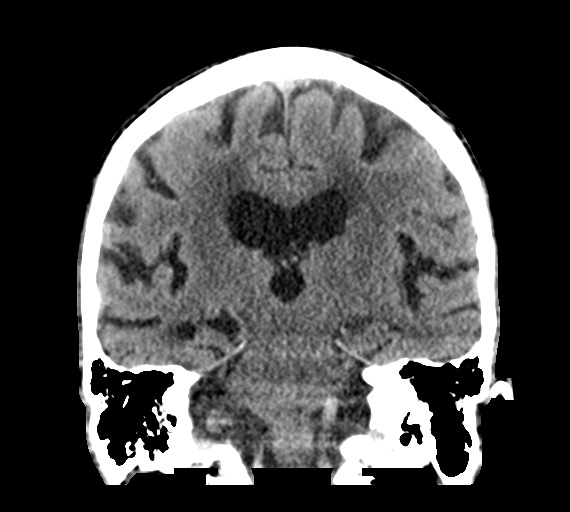

[Series 5: sagittal soft · sagittal · 0.33mm/px · 3 of 67 slices shown]
[im 23/67  brain]
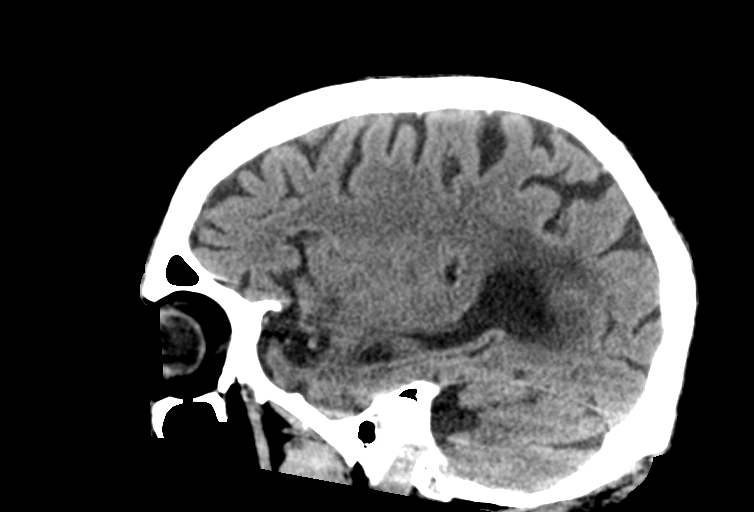
[im 34/67  brain]
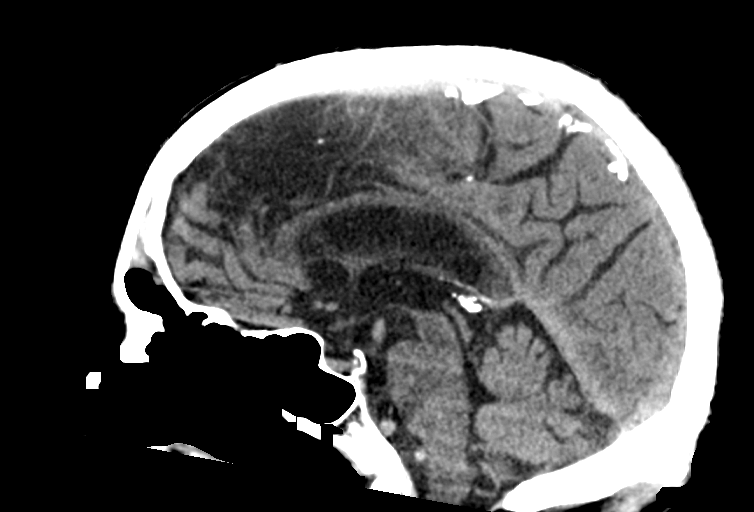
[im 45/67  brain]
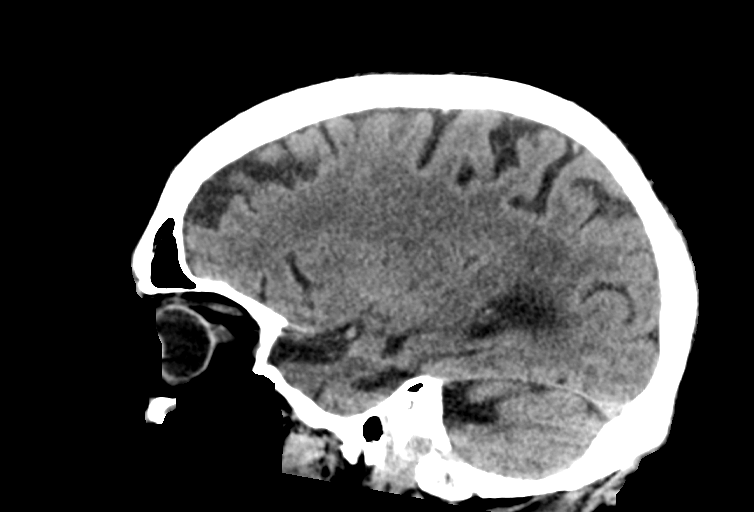

[15 of 47 positions shown; findings below may reference images not displayed]

FINDINGS: Brain: Moderate atrophy and moderate chronic microvascular ischemic
changes in the white matter are stable. No acute infarct,
hemorrhage, mass. No fluid collection or midline shift.

Vascular: Negative for hyperdense vessel

Skull: Negative

Sinuses/Orbits: Mild mucosal edema in the frontal sinus. Remaining
sinuses clear. Negative orbit.

Other: None

ASPECTS (Alberta Stroke Program Early CT Score)

- Ganglionic level infarction (caudate, lentiform nuclei, internal
capsule, insula, M1-M3 cortex): 7

- Supraganglionic infarction (M4-M6 cortex): 3

Total score (0-10 with 10 being normal): 10
IMPRESSION: 1. No acute abnormality
2. ASPECTS is 10
3. Atrophy and chronic microvascular ischemic changes stable from
the prior study.
4. These results were called by telephone at the time of
interpretation on 09/14/2019 at [DATE] to provider Savio Locklear, who
verbally acknowledged these results.
